# Patient Record
Sex: Female | Born: 1990 | Race: White | Hispanic: No | Marital: Married | Smoking: Never smoker
Health system: Southern US, Community
[De-identification: ages and names within clinical notes are randomized; demographics above are authoritative.]

## PROBLEM LIST (undated history)

## (undated) ENCOUNTER — Inpatient Hospital Stay: Payer: Self-pay

## (undated) DIAGNOSIS — F53 Postpartum depression: Secondary | ICD-10-CM

## (undated) DIAGNOSIS — E282 Polycystic ovarian syndrome: Secondary | ICD-10-CM

## (undated) DIAGNOSIS — F419 Anxiety disorder, unspecified: Secondary | ICD-10-CM

## (undated) DIAGNOSIS — N200 Calculus of kidney: Secondary | ICD-10-CM

## (undated) DIAGNOSIS — O99345 Other mental disorders complicating the puerperium: Secondary | ICD-10-CM

## (undated) DIAGNOSIS — N912 Amenorrhea, unspecified: Secondary | ICD-10-CM

## (undated) HISTORY — DX: Anxiety disorder, unspecified: F41.9

## (undated) HISTORY — DX: Amenorrhea, unspecified: N91.2

## (undated) HISTORY — PX: BREAST FIBROADENOMA SURGERY: SHX580

## (undated) HISTORY — DX: Polycystic ovarian syndrome: E28.2

## (undated) HISTORY — DX: Other mental disorders complicating the puerperium: O99.345

## (undated) HISTORY — DX: Postpartum depression: F53.0

---
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
## 2008-09-21 HISTORY — PX: BREAST EXCISIONAL BIOPSY: SUR124

## 2009-04-18 ENCOUNTER — Other Ambulatory Visit: Admission: RE | Admit: 2009-04-18 | Discharge: 2009-04-18 | Payer: Self-pay | Admitting: Gynecology

## 2009-04-18 ENCOUNTER — Ambulatory Visit: Payer: Self-pay | Admitting: Women's Health

## 2009-04-18 ENCOUNTER — Encounter: Payer: Self-pay | Admitting: Women's Health

## 2009-04-22 ENCOUNTER — Encounter: Admission: RE | Admit: 2009-04-22 | Discharge: 2009-04-22 | Payer: Self-pay | Admitting: Gynecology

## 2009-07-08 ENCOUNTER — Encounter (INDEPENDENT_AMBULATORY_CARE_PROVIDER_SITE_OTHER): Payer: Self-pay | Admitting: General Surgery

## 2009-07-08 ENCOUNTER — Ambulatory Visit (HOSPITAL_BASED_OUTPATIENT_CLINIC_OR_DEPARTMENT_OTHER): Admission: RE | Admit: 2009-07-08 | Discharge: 2009-07-08 | Payer: Self-pay | Admitting: General Surgery

## 2011-02-06 NOTE — Op Note (Signed)
NAMEFLOREEN, TEEGARDEN               ACCOUNT NO.:  [id number]   MEDICAL RECORD NO.:  [id number]          PATIENT TYPE:  AMB   LOCATION:  DSC                          FACILITY:  [facility]   PHYSICIAN:  Thornton Park. Daphine Deutscher, MD  DATE OF BIRTH:  1991-02-18   DATE OF PROCEDURE:  01/21/2007  DATE OF DISCHARGE:                               OPERATIVE REPORT   PREOPERATIVE DIAGNOSIS:  Fibroadenomata right breast.   POSTOPERATIVE DIAGNOSIS:  Fibroadenomata right breast.   PROCEDURE:  Right breast biopsy.   SURGEON:  Thornton Park. Daphine Deutscher, M.D.   ANESTHESIA:  General by LMA.   PREOPERATIVE INDICATIONS:  Rumaysa Sabatino is an 20 year old white female  who developed these palpable masses in her right upper outer breast.  They appeared to be fibroadenomas by ultrasound, but she was very  worried about them and wanted them removed.   She was taken to room 8 at [facility] on Jan 21, 2007.  Before  being given any sedation, she and I palpated the area and marked it.  I  tried to make an upper outer quadrant incision and to go a little  farther out and  then come in to get these masses, which were very  mobile.  I made the incision after prepping with Techni-Care and  draping.  I then used a suture grab the lesion and then cut down on and  get around it.  I cut out a nest of fibroadenomata which were palpable.  I palpated the remaining portion of breast and felt some just barely  fibrotic very hormonally active breast tissue, but no other lesions were  palpable at this time.  The area was injected with Marcaine and then  closed in layers of 4-0 Vicryl, 5-0 Vicryl subcuticularly, and with  Benzoin and Steri-Strips.  The patient seemed to tolerate the procedure  well.  She was given Vicodin to take for pain and asked to come back in  the office in about 3 weeks.      Thornton Park Daphine Deutscher, MD  Electronically Signed     MBM/MEDQ  D:  01/21/2007  T:  01/21/2007  Job:  [id number]   cc:   Davonna Belling. Young,  N.P.

## 2013-08-14 LAB — POCT WET PREP (WET MOUNT)

## 2015-07-03 ENCOUNTER — Ambulatory Visit: Payer: Self-pay | Admitting: Obstetrics and Gynecology

## 2015-07-03 ENCOUNTER — Encounter: Payer: Self-pay | Admitting: Obstetrics and Gynecology

## 2015-07-03 VITALS — BP 100/68 | HR 88 | Ht 63.0 in | Wt 139.4 lb

## 2015-07-03 DIAGNOSIS — O99345 Other mental disorders complicating the puerperium: Secondary | ICD-10-CM

## 2015-07-03 DIAGNOSIS — N921 Excessive and frequent menstruation with irregular cycle: Secondary | ICD-10-CM

## 2015-07-03 DIAGNOSIS — F53 Postpartum depression: Secondary | ICD-10-CM

## 2015-07-03 DIAGNOSIS — R5383 Other fatigue: Secondary | ICD-10-CM

## 2015-07-03 DIAGNOSIS — Z8742 Personal history of other diseases of the female genital tract: Secondary | ICD-10-CM

## 2015-07-03 DIAGNOSIS — F329 Major depressive disorder, single episode, unspecified: Secondary | ICD-10-CM

## 2015-07-03 DIAGNOSIS — R350 Frequency of micturition: Secondary | ICD-10-CM

## 2015-07-03 LAB — POCT URINALYSIS DIPSTICK
BILIRUBIN UA: NEGATIVE
Glucose, UA: NEGATIVE
Ketones, UA: NEGATIVE
LEUKOCYTES UA: NEGATIVE
NITRITE UA: NEGATIVE
PH UA: 7
PROTEIN UA: NEGATIVE
Spec Grav, UA: 1.01
Urobilinogen, UA: 0.2

## 2015-07-03 LAB — POCT URINE PREGNANCY: Preg Test, Ur: NEGATIVE

## 2015-07-03 MED ORDER — FLUOXETINE HCL 10 MG PO CAPS: 10.0000 mg | ORAL_CAPSULE | Freq: Every day | ORAL | Status: DC

## 2015-07-04 LAB — CBC
HEMATOCRIT: 36.6 % (ref 34.0–46.6)
Hemoglobin: 11.9 g/dL (ref 11.1–15.9)
MCH: 26.1 pg — AB (ref 26.6–33.0)
MCHC: 32.5 g/dL (ref 31.5–35.7)
MCV: 80 fL (ref 79–97)
Platelets: 288 10*3/uL (ref 150–379)
RBC: 4.56 x10E6/uL (ref 3.77–5.28)
RDW: 15.7 % — AB (ref 12.3–15.4)
WBC: 6.5 10*3/uL (ref 3.4–10.8)

## 2015-07-04 LAB — FERRITIN: Ferritin: 10 ng/mL — ABNORMAL LOW (ref 15–150)

## 2015-07-04 LAB — VITAMIN B12: Vitamin B-12: 317 pg/mL (ref 211–946)

## 2015-07-05 LAB — URINE CULTURE: Organism ID, Bacteria: NO GROWTH

## 2015-07-05 LAB — VITAMIN D 25 HYDROXY (VIT D DEFICIENCY, FRACTURES): VIT D 25 HYDROXY: 32.5 ng/mL (ref 30.0–100.0)

## 2015-07-10 ENCOUNTER — Encounter: Payer: Self-pay | Admitting: Obstetrics and Gynecology

## 2015-07-19 ENCOUNTER — Encounter: Payer: Self-pay | Admitting: Emergency Medicine

## 2015-07-19 ENCOUNTER — Emergency Department
Admission: EM | Admit: 2015-07-19 | Discharge: 2015-07-20 | Disposition: A | Discharge status: Other Acute Inpatient Hospital | Attending: Emergency Medicine | Admitting: Emergency Medicine

## 2015-07-19 DIAGNOSIS — Z79899 Other long term (current) drug therapy: Secondary | ICD-10-CM | POA: Insufficient documentation

## 2015-07-19 DIAGNOSIS — Z3202 Encounter for pregnancy test, result negative: Secondary | ICD-10-CM | POA: Diagnosis not present

## 2015-07-19 DIAGNOSIS — F339 Major depressive disorder, recurrent, unspecified: Secondary | ICD-10-CM | POA: Insufficient documentation

## 2015-07-19 DIAGNOSIS — R45851 Suicidal ideations: Secondary | ICD-10-CM

## 2015-07-19 DIAGNOSIS — F32A Depression, unspecified: Secondary | ICD-10-CM

## 2015-07-19 DIAGNOSIS — F329 Major depressive disorder, single episode, unspecified: Secondary | ICD-10-CM | POA: Diagnosis not present

## 2015-07-19 DIAGNOSIS — E282 Polycystic ovarian syndrome: Secondary | ICD-10-CM

## 2015-07-19 LAB — COMPREHENSIVE METABOLIC PANEL
ALK PHOS: 66 U/L (ref 38–126)
ALT: 16 U/L (ref 14–54)
ANION GAP: 10 (ref 5–15)
AST: 23 U/L (ref 15–41)
Albumin: 5.2 g/dL — ABNORMAL HIGH (ref 3.5–5.0)
BUN: 20 mg/dL (ref 6–20)
CHLORIDE: 101 mmol/L (ref 101–111)
CO2: 26 mmol/L (ref 22–32)
Calcium: 9.9 mg/dL (ref 8.9–10.3)
Creatinine, Ser: 0.81 mg/dL (ref 0.44–1.00)
GFR calc Af Amer: >60 mL/min (ref >60)
GLUCOSE: 74 mg/dL (ref 65–99)
POTASSIUM: 3.7 mmol/L (ref 3.5–5.1)
SODIUM: 137 mmol/L (ref 135–145)
TOTAL PROTEIN: 9 g/dL — AB (ref 6.5–8.1)
Total Bilirubin: 0.6 mg/dL (ref 0.3–1.2)

## 2015-07-19 LAB — CBC
HEMATOCRIT: 40.1 % (ref 35.0–47.0)
Hemoglobin: 13.3 g/dL (ref 12.0–16.0)
MCH: 26.3 pg (ref 26.0–34.0)
MCHC: 33 g/dL (ref 32.0–36.0)
MCV: 79.6 fL — ABNORMAL LOW (ref 80.0–100.0)
Platelets: 295 10*3/uL (ref 150–440)
RBC: 5.05 MIL/uL (ref 3.80–5.20)
RDW: 15.6 % — AB (ref 11.5–14.5)
WBC: 8 10*3/uL (ref 3.6–11.0)

## 2015-07-19 LAB — URINE DRUG SCREEN, QUALITATIVE (ARMC ONLY)
AMPHETAMINES, UR SCREEN: NOT DETECTED
BENZODIAZEPINE, UR SCRN: NOT DETECTED
Barbiturates, Ur Screen: NOT DETECTED
Cannabinoid 50 Ng, Ur ~~LOC~~: NOT DETECTED
Cocaine Metabolite,Ur ~~LOC~~: NOT DETECTED
MDMA (Ecstasy)Ur Screen: NOT DETECTED
METHADONE SCREEN, URINE: NOT DETECTED
OPIATE, UR SCREEN: NOT DETECTED
Phencyclidine (PCP) Ur S: NOT DETECTED
Tricyclic, Ur Screen: NOT DETECTED

## 2015-07-19 LAB — ETHANOL: Alcohol, Ethyl (B): <5 mg/dL (ref <5)

## 2015-07-19 LAB — ACETAMINOPHEN LEVEL

## 2015-07-19 LAB — SALICYLATE LEVEL: Salicylate Lvl: <4 mg/dL (ref 2.8–30.0)

## 2015-07-19 LAB — POCT PREGNANCY, URINE: PREG TEST UR: NEGATIVE

## 2015-07-19 MED ORDER — ACETAMINOPHEN 325 MG PO TABS
650.0000 mg | ORAL_TABLET | Freq: Once | ORAL | Status: AC
  Administered 2015-07-19: 650 mg via ORAL

## 2015-07-19 MED ORDER — TRAZODONE HCL 100 MG PO TABS
100.0000 mg | ORAL_TABLET | Freq: Every evening | ORAL | Status: DC | PRN
  Administered 2015-07-19: 100 mg via ORAL
  Filled 2015-07-19: qty 1

## 2015-07-19 MED ORDER — FLUOXETINE HCL 20 MG PO CAPS
20.0000 mg | ORAL_CAPSULE | Freq: Every day | ORAL | Status: DC
  Administered 2015-07-19 – 2015-07-20 (×2): 20 mg via ORAL
  Filled 2015-07-19 (×2): qty 1

## 2015-07-19 MED ORDER — LORAZEPAM 1 MG PO TABS
1.0000 mg | ORAL_TABLET | Freq: Once | ORAL | Status: AC
  Administered 2015-07-19: 1 mg via ORAL

## 2015-07-19 MED ORDER — LORAZEPAM 1 MG PO TABS
ORAL_TABLET | ORAL | Status: AC
  Filled 2015-07-19: qty 1

## 2015-07-19 MED ORDER — ACETAMINOPHEN 325 MG PO TABS
ORAL_TABLET | ORAL | Status: AC
  Filled 2015-07-19: qty 2

## 2015-07-19 NOTE — ED Notes (Signed)
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
## 2015-07-19 NOTE — ED Notes (Signed)
BEHAVIORAL HEALTH ROUNDING Patient sleeping: No. Patient alert and oriented: yes Behavior appropriate: Yes.   Nutrition and fluids offered: Yes  Toileting and hygiene offered: Yes  Sitter present: q15 min observations and security camera monitoring Law enforcement present: Yes [employer].  ENVIRONMENTAL ASSESSMENT Potentially harmful objects out of patient reach: Yes.   Personal belongings secured: Yes.   Patient dressed in hospital provided attire only: Yes.   Plastic bags out of patient reach: Yes.   Patient care equipment removed: Yes.   Equipment and supplies removed: Yes.   Potentially toxic materials out of patient reach: Yes.   Sharps container removed or out of patient reach: Yes.    Pt declined evening snack or beverage.

## 2015-07-19 NOTE — ED Notes (Signed)
BEHAVIORAL HEALTH ROUNDING Patient sleeping: Yes.   Patient alert and oriented: not applicable Behavior appropriate: Yes.    Nutrition and fluids offered: No Toileting and hygiene offered: No Sitter present: q15 minute observations and security camera monitoring Law enforcement present: Yes [employer] 
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
## 2015-07-19 NOTE — ED Notes (Signed)

## 2015-07-19 NOTE — ED Notes (Signed)
md Clpapacss consulted  - plan is for pt to be admitted to [facility] when staff/bed become available  Pt aware  Spouse allowed to visit for 15 minutes  - questions answered

## 2015-07-19 NOTE — ED Notes (Signed)
Introduced self to pt.  Pt asked what the process is with her current admission. I verified who the pt had talked with and explained the process. And informed her that once TTS gets her a bed assigned in [facility] Behavioral unit that I will call report when possible and get her moved downstairs. I explained the possibility of what could happen once the pt is admitted to lower level. Pt requested something for anxiety, stated that I will contact EDP, but did caution that EDP may defer anxiety medication until the pt is admitted downstairs.,

## 2015-07-19 NOTE — ED Notes (Signed)
Report received from [place].  Pt in room. No complaints or concerns voiced at this time. No abnormal behavior noted at this time. Will continue to monitor with q15 min checks and security camera monitoring. ODS officer in area.

## 2015-07-19 NOTE — ED Notes (Signed)
Patient called husband.  Patient feels safe in the hospital. She is aware that she will be transferred to the inpatient unit when she is assigned a bed.  Maintained on 15 minute checks and observation by security camera for safety.

## 2015-07-19 NOTE — ED Notes (Signed)
Pt requested something for anxiety. Spoke with Dr Clearnce Hasten EDP--1mg  ativan PO once ordered and administered as per MD order.

## 2015-07-19 NOTE — ED Notes (Signed)
Pt in room. No complaints or concerns voiced at this time. No abnormal behavior noted at this time. Will continue to monitor with q15 min checks and security camera monitoring. ODS officer in area.

## 2015-07-19 NOTE — ED Notes (Signed)
Pt states she was started on Prozac two weeks ago for post partum depression. Pt states that she has been having suicidal thoughts for last week. Pt states she is having marital problems that has contributed to the thoughts. Pt states she would shoot herself but does not have easy access to a gun. Pt is voluntarily checking in and is brought in by her husband. Pt is tearful at triage.

## 2015-07-19 NOTE — ED Notes (Signed)
BEHAVIORAL HEALTH ROUNDING Patient sleeping: No. Patient alert and oriented: yes Behavior appropriate: Yes.  ; If no, describe:  Nutrition and fluids offered: yes Toileting and hygiene offered: Yes  Sitter present: q15 minute observations and security camera monitoring Law enforcement present: Yes  ODS  

## 2015-07-19 NOTE — ED Provider Notes (Signed)
[facility] Emergency Department Provider Note  ____________________________________________  Time seen: Approximately 6:10 PM  I have reviewed the triage vital signs and the nursing notes.   HISTORY  Chief Complaint Suicidal    HPI Stephanie Logan is a 24 y.o. female with a history of depression since giving birth a month ago who is now having suicidal thoughts. She says that she is thinking of shooting herself with a gun if she had one. Says has been increasingly depressed despite being on Prozac. Denies any pain at this time.Denies any ingestions including any drugs or alcohol.   Past Medical History  Diagnosis Date  . PCOS (polycystic ovarian syndrome)   . Post partum depression     Patient Active Problem List   Diagnosis Date Noted  . Major depressive episode 07/19/2015  . PCOS (polycystic ovarian syndrome) 07/19/2015  . Suicidal ideation 07/19/2015    Past Surgical History  Procedure Laterality Date  . Breast fibroadenoma surgery  2008,2010    Current Outpatient Rx  Name  Route  Sig  Dispense  Refill  . clonazePAM (KLONOPIN) 0.5 MG tablet   Oral   Take 0.5 mg by mouth 2 (two) times daily as needed for anxiety.         Marland Kitchen FLUoxetine (PROZAC) 10 MG capsule   Oral   Take 1 capsule (10 mg total) by mouth daily.   30 capsule   3   . ibuprofen (ADVIL,MOTRIN) 800 MG tablet   Oral   Take 800 mg by mouth every 8 (eight) hours as needed for fever, headache, mild pain, moderate pain or cramping.           Allergies Review of patient's allergies indicates no known allergies.  Family History  Problem Relation Age of Onset  . Cancer Maternal Grandmother     brain    Social History Social History  Substance Use Topics  . Smoking status: Never Smoker   . Smokeless tobacco: Never Used  . Alcohol Use: Yes     Comment: occas    Review of Systems Constitutional: No fever/chills Eyes: No visual changes. ENT: No sore  throat. Cardiovascular: Denies chest pain. Respiratory: Denies shortness of breath. Gastrointestinal: No abdominal pain.  No nausea, no vomiting.  No diarrhea.  No constipation. Genitourinary: Negative for dysuria. Musculoskeletal: Negative for back pain. Skin: Negative for rash. Neurological: Negative for headaches, focal weakness or numbness.  10-point ROS otherwise negative.  ____________________________________________   PHYSICAL EXAM:  VITAL SIGNS: ED Triage Vitals  Enc Vitals Group     BP 07/19/15 1526 105/73 mmHg     Pulse Rate 07/19/15 1526 75     Resp 07/19/15 1526 15     Temp 07/19/15 1526 98.4 F (36.9 C)     Temp Source 07/19/15 1526 Oral     SpO2 07/19/15 1526 99 %     Weight 07/19/15 1526 135 lb (61.236 kg)     Height 07/19/15 1526 5\' 2"  (1.575 m)     Head Cir --      Peak Flow --      Pain Score 07/19/15 1535 6     Pain Loc --      Pain Edu? --      Excl. in [place]? --     Constitutional: Alert and oriented. Well appearing and in no acute distress. Eyes: Conjunctivae are normal. PERRL. EOMI. Head: Atraumatic. Nose: No congestion/rhinnorhea. Mouth/Throat: Mucous membranes are moist.  Oropharynx non-erythematous. Neck: No stridor.  Cardiovascular: Normal rate, regular rhythm. Grossly normal heart sounds.  Good peripheral circulation. Respiratory: Normal respiratory effort.  No retractions. Lungs CTAB. Gastrointestinal: Soft and nontender. No distention. No abdominal bruits. No CVA tenderness. Musculoskeletal: No lower extremity tenderness nor edema.  No joint effusions. Neurologic:  Normal speech and language. No gross focal neurologic deficits are appreciated. No gait instability. Skin:  Skin is warm, dry and intact. No rash noted. Psychiatric: Breast mood. Speech and behavior are normal.  ____________________________________________   LABS (all labs ordered are listed, but only abnormal results are displayed)  Labs Reviewed  COMPREHENSIVE METABOLIC  PANEL - Abnormal; Notable for the following:    Total Protein 9.0 (*)    Albumin 5.2 (*)    All other components within normal limits  ACETAMINOPHEN LEVEL - Abnormal; Notable for the following:    Acetaminophen (Tylenol), Serum <10 (*)    All other components within normal limits  CBC - Abnormal; Notable for the following:    MCV 79.6 (*)    RDW 15.6 (*)    All other components within normal limits  ETHANOL  SALICYLATE LEVEL  URINE DRUG SCREEN, QUALITATIVE ([facility] ONLY)  POCT PREGNANCY, URINE  POC URINE PREG, ED   ____________________________________________  EKG   ____________________________________________  RADIOLOGY   ____________________________________________   PROCEDURES   ____________________________________________   INITIAL IMPRESSION / ASSESSMENT AND PLAN / ED COURSE  Pertinent labs & imaging results that were available during my care of the patient were reviewed by me and considered in my medical decision making (see chart for details).  Patient has been seen and evaluated by Dr. Weber Cooks and he is recommending admission to the psychiatric unit at this time. The patient understands the plan is one to comply. ____________________________________________   FINAL CLINICAL IMPRESSION(S) / ED DIAGNOSES  Depression with suicidal ideation.    Orbie Pyo, MD 07/19/15 [phone]

## 2015-07-20 ENCOUNTER — Encounter: Payer: Self-pay | Admitting: General Practice

## 2015-07-20 ENCOUNTER — Inpatient Hospital Stay
Admission: AD | Admit: 2015-07-20 | Discharge: 2015-07-22 | DRG: 885 | Disposition: A | Discharge status: Home / Self Care | Source: Ambulatory Visit | Attending: Psychiatry | Admitting: Psychiatry

## 2015-07-20 DIAGNOSIS — Z818 Family history of other mental and behavioral disorders: Secondary | ICD-10-CM

## 2015-07-20 DIAGNOSIS — E282 Polycystic ovarian syndrome: Secondary | ICD-10-CM | POA: Diagnosis present

## 2015-07-20 DIAGNOSIS — R45851 Suicidal ideations: Secondary | ICD-10-CM | POA: Diagnosis present

## 2015-07-20 DIAGNOSIS — F339 Major depressive disorder, recurrent, unspecified: Secondary | ICD-10-CM | POA: Diagnosis not present

## 2015-07-20 DIAGNOSIS — F332 Major depressive disorder, recurrent severe without psychotic features: Secondary | ICD-10-CM | POA: Diagnosis present

## 2015-07-20 DIAGNOSIS — G4700 Insomnia, unspecified: Secondary | ICD-10-CM | POA: Diagnosis present

## 2015-07-20 DIAGNOSIS — Z809 Family history of malignant neoplasm, unspecified: Secondary | ICD-10-CM | POA: Diagnosis not present

## 2015-07-20 DIAGNOSIS — F329 Major depressive disorder, single episode, unspecified: Secondary | ICD-10-CM | POA: Diagnosis present

## 2015-07-20 DIAGNOSIS — Z630 Problems in relationship with spouse or partner: Secondary | ICD-10-CM | POA: Diagnosis not present

## 2015-07-20 DIAGNOSIS — F419 Anxiety disorder, unspecified: Secondary | ICD-10-CM | POA: Diagnosis present

## 2015-07-20 LAB — LIPID PANEL
CHOL/HDL RATIO: 2.9 ratio
CHOLESTEROL: 197 mg/dL (ref 0–200)
HDL: 67 mg/dL (ref >40)
LDL Cholesterol: 119 mg/dL — ABNORMAL HIGH (ref 0–99)
TRIGLYCERIDES: 54 mg/dL (ref <150)
VLDL: 11 mg/dL (ref 0–40)

## 2015-07-20 LAB — TSH: TSH: 0.792 u[IU]/mL (ref 0.350–4.500)

## 2015-07-20 LAB — HEMOGLOBIN A1C: HEMOGLOBIN A1C: 5.3 % (ref 4.0–6.0)

## 2015-07-20 MED ORDER — FLUOXETINE HCL 20 MG PO CAPS
20.0000 mg | ORAL_CAPSULE | Freq: Every day | ORAL | Status: DC
  Administered 2015-07-21 – 2015-07-22 (×2): 20 mg via ORAL
  Filled 2015-07-20 (×2): qty 1

## 2015-07-20 MED ORDER — ACETAMINOPHEN 325 MG PO TABS
650.0000 mg | ORAL_TABLET | Freq: Four times a day (QID) | ORAL | Status: DC | PRN
  Administered 2015-07-20: 650 mg via ORAL
  Filled 2015-07-20: qty 2

## 2015-07-20 MED ORDER — ALUM & MAG HYDROXIDE-SIMETH 200-200-20 MG/5ML PO SUSP: 30.0000 mL | ORAL | Status: DC | PRN

## 2015-07-20 MED ORDER — MAGNESIUM HYDROXIDE 400 MG/5ML PO SUSP: 30.0000 mL | Freq: Every day | ORAL | Status: DC | PRN

## 2015-07-20 MED ORDER — CLONAZEPAM 0.5 MG PO TABS
0.5000 mg | ORAL_TABLET | Freq: Two times a day (BID) | ORAL | Status: DC | PRN
  Administered 2015-07-20: 0.5 mg via ORAL
  Filled 2015-07-20: qty 1

## 2015-07-20 MED ORDER — TRAZODONE HCL 100 MG PO TABS: 100.0000 mg | ORAL_TABLET | Freq: Every evening | ORAL | Status: DC | PRN

## 2015-07-20 MED ORDER — TRAZODONE HCL 50 MG PO TABS
50.0000 mg | ORAL_TABLET | Freq: Every day | ORAL | Status: DC
  Administered 2015-07-20 – 2015-07-21 (×2): 50 mg via ORAL
  Filled 2015-07-20 (×2): qty 1
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
## 2015-07-20 NOTE — ED Notes (Signed)
NAD noted at this time. Pt calm and cooperative with staff. Will continue to monitor with 15 min safety rounds at this time.

## 2015-07-20 NOTE — ED Notes (Signed)
ED BHU [place] Is the patient under IVC or is there intent for IVC: Yes.   Is the patient medically cleared: Yes.   Is there vacancy in the ED BHU: Yes.   Is the population mix appropriate for patient: Yes.   Is the patient awaiting placement in inpatient or outpatient setting: Yes.   Has the patient had a psychiatric consult: Yes.   Survey of unit performed for contraband, proper placement and condition of furniture, tampering with fixtures in bathroom, shower, and each patient room: Yes.  ; Findings: Unremarkable APPEARANCE/BEHAVIOR calm, cooperative and adequate rapport can be established NEURO ASSESSMENT Orientation: time, place and person Hallucinations: No.None noted (Hallucinations) Speech: Normal Gait: normal RESPIRATORY ASSESSMENT Normal expansion.  Clear to auscultation.  No rales, rhonchi, or wheezing. CARDIOVASCULAR ASSESSMENT regular rate and rhythm, S1, S2 normal, no murmur, click, rub or gallop GASTROINTESTINAL ASSESSMENT soft, nontender, BS WNL, no r/g EXTREMITIES normal strength, tone, and muscle mass PLAN OF CARE Provide calm/safe environment. Vital signs assessed twice daily. ED BHU Assessment once each 12-hour shift. Collaborate with intake RN daily or as condition indicates. Assure the ED provider has rounded once each shift. Provide and encourage hygiene. Provide redirection as needed. Assess for escalating behavior; address immediately and inform ED provider.  Assess family dynamic and appropriateness for visitation as needed: Yes.  ; If necessary, describe findings:  Educate the patient/family about BHU procedures/visitation: Yes.  ; If necessary, describe findings:

## 2015-07-20 NOTE — ED Notes (Signed)
Pt in room. No complaints or concerns voiced at this time. No abnormal behavior noted at this time. Will continue to monitor with q15 min checks and security camera monitoring. ODS officer in area.

## 2015-07-20 NOTE — ED Notes (Signed)
Pt visualized in NAD. Pt ambulatory around her room. Respirations noted to be even and unlabored at this time. Pt alert and oriented.

## 2015-07-20 NOTE — Progress Notes (Signed)
Pt. is to be admitted to [facility] by Dr. Weber Cooks. Attending Physician will be Dr. Jimmye Norman.  Pt. has been assigned to room 322A, by [facility].  Intake Paper Work has been signed and placed on pt. chart. ER staff [facility].; Dr. Karma Greaser, ER MD; Jinny Blossom Patient's Nurse & Marliss Czar Patient Access) have been made aware of the admission.         07/20/2015 Con Memos, MS, [place], [facility]CA Therapeutic Triage Specialist

## 2015-07-20 NOTE — ED Notes (Signed)
BEHAVIORAL HEALTH ROUNDING Patient sleeping: No. Patient alert and oriented: yes Behavior appropriate: Yes.  ; If no, describe:  Nutrition and fluids offered: Yes Toileting and hygiene offered: Yes  Sitter present: yes Law enforcement present: Yes [facility]  

## 2015-07-20 NOTE — BHH Group Notes (Signed)
[place] LCSW Group Therapy  07/20/2015 2:35 PM  Type of Therapy:  Group Therapy  Participation Level:  Minimal   Participation Quality:  Attentive  Affect:  Tearful   Cognitive:  Alert  Insight:  Limited  Engagement in Therapy:  Limited  Modes of Intervention:  Discussion, Education, Socialization and Support  Summary of Progress/Problems: Pt will identify unhealthy thoughts and how they impact their emotions and behavior. Pt will be encouraged to discuss these thoughts, emotions and behaviors with the group. Pt attended group and stayed the entire time. She sat quietly and listened to other group members share.   [place] MSW, [place]  07/20/2015, 2:35 PM

## 2015-07-20 NOTE — ED Provider Notes (Addendum)
-----------------------------------------   7:04 AM on 07/20/2015 -----------------------------------------   Blood pressure 110/65, pulse 94, temperature 97.9 F (36.6 C), temperature source Oral, resp. rate 20, height 5\' 2"  (1.575 m), weight 135 lb (61.236 kg), last menstrual period 06/25/2015, SpO2 100 %.  The patient had no acute events since last update.  Calm and cooperative at this time.  Disposition is pending per Psychiatry/Behavioral Medicine team recommendations, and yesterday Dr. Weber Cooks recommended admission.    ----------------------------------------- 10:42 AM on 07/20/2015 -----------------------------------------  I was just informed that they are preparing to take the patient downstairs for major depressive disorder.  Hinda Kehr, MD 07/20/15 [phone]

## 2015-07-20 NOTE — ED Notes (Signed)
NAD noted at this time. Pt to be taken down to [facility] Unit by tech and officer. Pt ambulatory at this time.

## 2015-07-20 NOTE — ED Notes (Signed)
BEHAVIORAL HEALTH ROUNDING Patient sleeping: Yes.   Patient alert and oriented: not applicable Behavior appropriate: Yes.    Nutrition and fluids offered: No Toileting and hygiene offered: No Sitter present: q15 minute observations and security camera monitoring Law enforcement present: Yes [employer] 

## 2015-07-20 NOTE — ED Notes (Signed)
BEHAVIORAL HEALTH ROUNDING Patient sleeping: No. Patient alert and oriented: yes Behavior appropriate: Yes.   Nutrition and fluids offered: Yes  Toileting and hygiene offered: Yes  Sitter present: q15 min observations and security camera monitoring Law enforcement present: Yes [employer] 

## 2015-07-20 NOTE — ED Notes (Signed)

## 2015-07-20 NOTE — BHH Suicide Risk Assessment (Signed)
[facility] Admission Suicide Risk Assessment   Nursing information obtained from:    Demographic factors:    Caucasian, age Current Mental Status:    depression, suicidal ideation with plan Loss Factors:    stable housing Historical Factors:    no past suicide attempt Risk Reduction Factors:    female, social supports, minor children living in the home Total Time spent with patient: 1 hour Principal Problem: <principal problem not specified> Diagnosis:   Patient Active Problem List   Diagnosis Date Noted  . Major depression ([place]) [F32.9] 07/20/2015  . Major depressive episode [F32.9] 07/19/2015  . PCOS (polycystic ovarian syndrome) [E28.2] 07/19/2015  . Suicidal ideation [R45.851] 07/19/2015     Continued Clinical Symptoms:    The "Alcohol Use Disorders Identification Test", Guidelines for Use in Primary Care, Second Edition.  World Pharmacologist [facility]). Score between 0-7:  no or low risk or alcohol related problems. Score between 8-15:  moderate risk of alcohol related problems. Score between 16-19:  high risk of alcohol related problems. Score 20 or above:  warrants further diagnostic evaluation for alcohol dependence and treatment.   CLINICAL FACTORS:   Depression:   Severe   Musculoskeletal: Strength & Muscle Tone: within normal limits Gait & Station: normal Patient leans: N/A  Psychiatric Specialty Exam: Physical Exam  ROS  Blood pressure 97/63, pulse 72, temperature 98.6 F (37 C), temperature source Oral, resp. rate 18, height 5\' 2"  (1.575 m), weight 59.648 kg (131 lb 8 oz), last menstrual period 06/25/2015, SpO2 99 %.Body mass index is 24.05 kg/(m^2).  See H and P                                                       COGNITIVE FEATURES THAT CONTRIBUTE TO RISK:  None    SUICIDE RISK:   Moderate:  Frequent suicidal ideation with limited intensity, and duration, some specificity in terms of plans, no associated intent, good self-control,  limited dysphoria/symptomatology, some risk factors present, and identifiable protective factors, including available and accessible social support. Patient seems to describe the thoughts as ego dystonic stating that there is a side of her that she has a good life, children and thus no reason to commit suicide. She has no past suicide attempts. She seemed to have some forward thinking and desires wanting to engage in therapy both individual and marital.  PLAN OF CARE:  Daily contact with patient to assess and evaluate symptoms and progress in treatment, Medication management and Plan Major depressive disorder, severe without psychotic features-patient just had her Prozac increased from 10 mg to 20 mg in the emergency room here.   Insomnia-we will reduce patient's dose of trazodone from 100 mg to 50 mg as she is reporting excessive sedation in the daytime.   Anxiety-patient reports past response to Klonopin and thus we will prescribe Klonopin 0.5 mg twice daily as needed for anxiety.   Suicidal ideation with intent-continuous monitoring. Patient will participate in therapeutic milieu to explore ways of coping with her recent stressors.   Disposition-patient will likely be undergo home, describes has been supportive and has made plans to engage in treatment and already has some appointment schedule. Medical Decision Making:  Review of Psycho-Social Stressors (1), Established Problem, Worsening (2), Review of Medication Regimen & Side Effects (2) and Review of New Medication or  Change in Dosage (2)  I certify that inpatient services furnished can reasonably be expected to improve the patient's condition.   Faith Rogue 07/20/2015, 3:50 PM

## 2015-07-20 NOTE — ED Notes (Signed)
Patient assigned to appropriate care area. Patient oriented to unit/care area: Informed that, for their safety, care areas are designed for safety and monitored by security cameras at all times; and visiting hours explained to patient. Patient verbalizes understanding, and verbal contract for safety obtained. 

## 2015-07-20 NOTE — H&P (Signed)
Psychiatric Admission Assessment Adult  Patient Identification: Stephanie Logan MRN:  [id number] Date of Evaluation:  07/20/2015 Chief Complaint:  major depressive disorder "thoughts of suicide." Principal Diagnosis: <principal problem not specified> Diagnosis:   Patient Active Problem List   Diagnosis Date Noted  . Major depression ([place]) [F32.9] 07/20/2015  . Major depressive episode [F32.9] 07/19/2015  . PCOS (polycystic ovarian syndrome) [E28.2] 07/19/2015  . Suicidal ideation [R45.851] 07/19/2015   History of Present Illness:: Patient indicates that since her 52-month-old was born she had been experiencing depression. She reports depressive symptoms of difficulty sleeping, anhedonia although she states she did have fun on a vacation 2 weeks ago. She endorses feelings of guilt and worthlessness. She reports low energy, poor appetite, depressed mood and suicidal ideation. She indicated that things have worsened over the past week. She indicates that this week she also informed her husband of a excellent discretion that occurred 6 years ago. She has significant guilt around this issue. She states that he accepted the news graciously and this further makes her feel unworthy of him.  She states that she saw her midwife 3 weeks ago and was placed on Prozac although it appears it was a low dose of 10 mg. In the emergency room Dr. Carlena Hurl increased this dose to 20 mg. He states that she also received treatment with Prozac after the birth of her first son who is now 67-1/2 years old. She states she took the Prozac for about 2 months and it was helpful then in terms of depressed mood.  She denies any symptoms consistent with mania and denies any symptoms consistent with auditory or visual hallucinations. She does state that in regards to her suicidal ideation she feels like she is having 2 different thoughts about it. She states that one side of her speaks to the fact she has a good life, good husband  and 2 kids and thus no reason for suicide. She states the other side speaks to her being depressed, worthless and suicidal. She states they're not frank voices but she describes him as very strong conflicting thoughts.  She states that she began some individual therapy at [facility] in [place] this past week and has an upcoming couple's counseling session scheduled for 07/23/2015 as well as another individual therapy session scheduled for later next week.  Associated Signs/Symptoms: Depression Symptoms:  depressed mood, anhedonia, insomnia, feelings of worthlessness/guilt, suicidal thoughts with specific plan, anxiety, loss of energy/fatigue, decreased appetite, (Hypo) Manic Symptoms:  Denies Anxiety Symptoms:  Excessive Worry, she describes symptoms of palpitations, racing thoughts and feeling like she is not existing in reality. She states that typically this can last a full day but does not happen every day Psychotic Symptoms:  None PTSD Symptoms: Negative Total Time spent with patient: 1 hour  Past Psychiatric History: She denies any past psychiatric hospitalizations. She denies any previous suicide attempts. As discussed above she has been started on Prozac 3 weeks ago. She also had a previous trial of Prozac for about 2 months 2 and half years ago. She has recently started therapy as discussed above. She states she's never had medication management from a psychiatrist.  Risk to Self:   currently Risk to Others:   none Prior Inpatient Therapy:   none Prior Outpatient Therapy:   just recently started  Alcohol Screening: 1. How often do you have a drink containing alcohol?: 2 to 3 times a week 2. How many drinks containing alcohol do you have on a typical  day when you are drinking?: 3 or 4 3. How often do you have six or more drinks on one occasion?: Never Preliminary Score: 1 Brief Intervention: Yes Substance Abuse History in the last 12 months:  No. Consequences of  Substance Abuse: NA Previous Psychotropic Medications: Yes  Psychological Evaluations: No  Past Medical History:  Past Medical History  Diagnosis Date  . PCOS (polycystic ovarian syndrome)   . Post partum depression     Past Surgical History  Procedure Laterality Date  . Breast fibroadenoma surgery  2008,2010   Family History:  Family History  Problem Relation Age of Onset  . Cancer Maternal Grandmother     brain   Family Psychiatric  History: Patient states that her twins Sr. is treated for depression and her twin sister had suicidal ideation in 2008 or 2009 Social History:  History  Alcohol Use  . Yes    Comment: occas     History  Drug Use No    Social History   Social History  . Marital Status: Married    Spouse Name: N/A  . Number of Children: N/A  . Years of Education: N/A   Social History Main Topics  . Smoking status: Never Smoker   . Smokeless tobacco: Never Used  . Alcohol Use: Yes     Comment: occas  . Drug Use: No  . Sexual Activity: Yes    Birth Control/ Protection: None   Other Topics Concern  . None   Social History Narrative   Additional Social History:    patient is married. She has a 78 month old son and a 13-1/2-year-old son. She states her husband was in the [employer] and lost both of his legs. Patient has stopped working to take care of him. She has had some social stressors and that they had a house built for them through a veterans housing program however it ended up having so many structural defects that they were not able to reside there. She states there were scheduled to move in there in July but with all the problems they were not able to and thus they're in some temporary housing which is not desirable to the patient.  Asian describes her childhood as fairly good. She states her parents went through a difficult divorce and she had some court mandated treatment for/therapy during their divorce. She denies any abuse growing up. She has  the twins Sr. discussed above and one brother. History of alcohol / drug use?: No history of alcohol / drug abuse                    Allergies:  No Known Allergies Lab Results:  Results for orders placed or performed during the hospital encounter of 07/20/15 (from the past 48 hour(s))  Hemoglobin A1c     Status: None   Collection Time: 07/19/15  3:42 PM  Result Value Ref Range   Hgb A1c MFr Bld 5.3 4.0 - 6.0 %  Lipid panel, fasting     Status: Abnormal   Collection Time: 07/19/15  3:42 PM  Result Value Ref Range   Cholesterol 197 0 - 200 mg/dL   Triglycerides 54 <150 mg/dL   HDL 67 >40 mg/dL   Total CHOL/HDL Ratio 2.9 RATIO   VLDL 11 0 - 40 mg/dL   LDL Cholesterol 119 (H) 0 - 99 mg/dL    Comment:        Total Cholesterol/HDL:CHD Risk Coronary Heart Disease Risk Table  Men   Women  1/2 Average Risk   3.4   3.3  Average Risk       5.0   4.4  2 X Average Risk   9.6   7.1  3 X Average Risk  23.4   11.0        Use the calculated Patient Ratio above and the CHD Risk Table to determine the patient's CHD Risk.        ATP III CLASSIFICATION (LDL):  <100     mg/dL   Optimal  100-129  mg/dL   Near or Above                    Optimal  130-159  mg/dL   Borderline  160-189  mg/dL   High  >190     mg/dL   Very High   TSH     Status: None   Collection Time: 07/19/15  3:42 PM  Result Value Ref Range   TSH 0.792 0.350 - 4.500 uIU/mL    Metabolic Disorder Labs:  Lab Results  Component Value Date   HGBA1C 5.3 07/19/2015   No results found for: PROLACTIN Lab Results  Component Value Date   CHOL 197 07/19/2015   TRIG 54 07/19/2015   HDL 67 07/19/2015   CHOLHDL 2.9 07/19/2015   VLDL 11 07/19/2015   LDLCALC 119* 07/19/2015    Current Medications: Current Facility-Administered Medications  Medication Dose Route Frequency Provider Last Rate Last Dose  . acetaminophen (TYLENOL) tablet 650 mg  650 mg Oral Q6H PRN Gonzella Lex, MD   650 mg at 07/20/15  1318  . alum & mag hydroxide-simeth (MAALOX/MYLANTA) 200-200-20 MG/5ML suspension 30 mL  30 mL Oral Q4H PRN Gonzella Lex, MD      . Derrill Memo ON 07/21/2015] FLUoxetine (PROZAC) capsule 20 mg  20 mg Oral Daily John T Clapacs, MD      . magnesium hydroxide (MILK OF MAGNESIA) suspension 30 mL  30 mL Oral Daily PRN Gonzella Lex, MD      . traZODone (DESYREL) tablet 50 mg  50 mg Oral QHS Marjie Skiff, MD       PTA Medications: Prescriptions prior to admission  Medication Sig Dispense Refill Last Dose  . ibuprofen (ADVIL,MOTRIN) 800 MG tablet Take 800 mg by mouth every 8 (eight) hours as needed for fever, headache, mild pain, moderate pain or cramping.   PRN at PRN    Musculoskeletal: Strength & Muscle Tone: within normal limits Gait & Station: normal Patient leans: N/A  Psychiatric Specialty Exam: Physical Exam  Review of Systems  Neurological: Positive for headaches (Patient she stated she had a headache while waiting in the emergency room but it resolved with Tylenol).  Psychiatric/Behavioral: Positive for depression and suicidal ideas. Negative for hallucinations, memory loss and substance abuse. The patient is nervous/anxious and has insomnia.   All other systems reviewed and are negative.   Blood pressure 97/63, pulse 72, temperature 98.6 F (37 C), temperature source Oral, resp. rate 18, height 5\' 2"  (1.575 m), weight 59.648 kg (131 lb 8 oz), last menstrual period 06/25/2015, SpO2 99 %.Body mass index is 24.05 kg/(m^2).  General Appearance: Well Groomed  Engineer, water::  Good  Speech:  Normal Rate  Volume:  Normal  Mood:  Depressed  Affect:  Congruent  Thought Process:  Linear and Logical  Orientation:  Full (Time, Place, and Person)  Thought Content:  Negative  Suicidal Thoughts:  Yes.  with  intent/plan  Homicidal Thoughts:  No  Memory:  Immediate;   Good Recent;   Good Remote;   Good  Judgement:  Impaired  Insight:  Fair  Psychomotor Activity:  Negative   Concentration:  Fair  Recall:  Good  Fund of Knowledge:Good  Language: Good  Akathisia:  Negative  Handed:    AIMS (if indicated):     Assets:  Communication Skills Desire for Improvement Social Support  ADL's:  Intact  Cognition: WNL  Sleep:    poor but relates he responded to trazodone in the ED      Treatment Plan Summary: Daily contact with patient to assess and evaluate symptoms and progress in treatment, Medication management and Plan Major depressive disorder, severe without psychotic features-patient just had her Prozac increased from 10 mg to 20 mg in the emergency room here.   Insomnia-we will reduce patient's dose of trazodone from 100 mg to 50 mg as she is reporting excessive sedation in the daytime.   Anxiety-patient reports past response to Klonopin and thus we will prescribe Klonopin 0.5 mg twice daily as needed for anxiety.   Suicidal ideation with intent-continuous monitoring. Patient will participate in therapeutic milieu to explore ways of coping with her recent stressors.   Disposition-patient will likely be undergo home, describes has been supportive and has made plans to engage in treatment and already has some appointment schedule.  Observation Level/Precautions:  Continuous Observation  Laboratory:  Reviewed  Psychotherapy:    Medications:    Consultations:    Discharge Concerns:    Estimated LOS:  Other:     I certify that inpatient services furnished can reasonably be expected to improve the patient's condition.   Faith Rogue 10/29/20163:36 PM

## 2015-07-20 NOTE — ED Notes (Signed)
NAD noted at this time. Pt states that she has intermittent thoughts of hurting herself with "no plan exactly" but denies SI/HI at this time. Pt presents as being calm and cooperative but states that she feels "trapped". Pt noted to be intermittently tearful when speaking about her children and current situation. Pt denies any needs at this time.

## 2015-07-20 NOTE — ED Notes (Signed)
NAD noted at this time. Pt calm and cooperative with staff. Respirations noted to be even and unlabored.

## 2015-07-20 NOTE — Tx Team (Signed)
Initial Interdisciplinary Treatment Plan   PATIENT STRESSORS: Marital or family conflict Traumatic event   PATIENT STRENGTHS: Communication skills General fund of knowledge Supportive family/friends   PROBLEM LIST: Problem List/Patient Goals Date to be addressed Date deferred Reason deferred Estimated date of resolution  depression 07/20/2015     suicide 07/20/2015                                                DISCHARGE CRITERIA:  Ability to meet basic life and health needs Safe-care adequate arrangements made Verbal commitment to aftercare and medication compliance  PRELIMINARY DISCHARGE PLAN: Attend aftercare/continuing care group Return to previous living arrangement  PATIENT/FAMIILY INVOLVEMENT: This treatment plan has been presented to and reviewed with the patient, Stephanie Logan, and/or family member, .  The patient and family have been given the opportunity to ask questions and make suggestions.  Stephanie Logan 07/20/2015, 3:37 PM

## 2015-07-20 NOTE — ED Notes (Signed)
Pt tearful and crying while laying on bed when I brought pt one time ativan dose. Discussed with pt why she was feeling so tearful. Pt stated " I feel like I am being selfish being in here. I miss my boys. I feel like my husband is having to handle all of this on his own". I discussed with pt that self care is important especially when a person is feeling depressed as she currently feels.  I discussed with pt that she needs to take care of herself first before she can take care of anyone else, which I stated "it may sound selfish, but it is the best gift that you can give your family, by taking care of yourself, you will be able to be the being the best mom and wife that you are".  Pt stated " I am afraid that I am going to miss my youngest (62 month old) boys first halloween." I suggested that pt try to think of fun things she can do with her boys once she is discharged.  One suggestion which caused the pt to smile and state "that sounds like fun" was for her and the youngest to be in the doorways of the rooms in their home and have the older (2 1/26 yr old) trick or treat to each room.  Pt was sitting on bed smiling and less tearful after discussion.  I informed pt that if she needed something to help her sleep that she did have trazadone ordered. Pt stated "please get me that medicine" , pt was then administered the trazadone, I discussed the need to have good sleep hygiene and to try to get a good nights rest.  Pt sitting on bed, watching tv when I left the room.

## 2015-07-20 NOTE — ED Notes (Signed)
NAD noted at this time. Pt given phone at this time. Respirations even and unlabored. Pt calm and cooperative at this time

## 2015-07-21 ENCOUNTER — Encounter: Payer: Self-pay | Admitting: General Practice

## 2015-07-21 DIAGNOSIS — F332 Major depressive disorder, recurrent severe without psychotic features: Secondary | ICD-10-CM

## 2015-07-21 MED ORDER — CLONAZEPAM 0.5 MG PO TABS
0.5000 mg | ORAL_TABLET | Freq: Two times a day (BID) | ORAL | Status: DC
  Administered 2015-07-21 – 2015-07-22 (×2): 0.5 mg via ORAL
  Filled 2015-07-21 (×2): qty 1

## 2015-07-21 NOTE — Plan of Care (Signed)
Problem: Alteration in mood Goal: LTG-Patient reports reduction in suicidal thoughts (Patient reports reduction in suicidal thoughts and is able to verbalize a safety plan for whenever patient is feeling suicidal)  Outcome: Progressing Patient denies current SI and states she if feeling hopeful.  Goal: LTG-Pt's behavior demonstrates decreased signs of depression (Patient's behavior demonstrates decreased signs of depression to the point the patient is safe to return home and continue treatment in an outpatient setting)  Outcome: Progressing Patient interactive in dayroom with peers, positive and appropriate visit with family during visiting hours.  Goal: STG-Patient is able to discuss feelings and issues (Patient is able to discuss feelings and issues leading to depression)  Outcome: Progressing Open to discussion and the assessment process.

## 2015-07-21 NOTE — [facility] Group Notes (Signed)
[facility] LCSW Group Therapy  07/21/2015 6:45 PM  Type of Therapy:  Group Therapy  Participation Level:  Minimal  Participation Quality:  Attentive  Affect:  Tearful  Cognitive:  Alert  Insight:  Limited  Engagement in Therapy:  Limited  Modes of Intervention:  Discussion, Education, Socialization and Support  Summary of Progress/Problems:Todays topic: Grudges  Patients will be encouraged to discuss their thoughts, feelings, and behaviors as to why one holds on to grudges and reasons why people have grudges. Patients will process the impact of grudges on their daily lives and identify thoughts and feelings related to holding grudges. Patients will identify feelings and thoughts related to what life would look like without grudges. Pt attended group and stayed the entire time. She sat quietly and listened to other group members.   [place] MSW, [place]  07/21/2015, 6:45 PM

## 2015-07-21 NOTE — Progress Notes (Signed)
[facility] MD Progress Note  07/21/2015 2:35 PM Stephanie Logan  MRN:  [id number] Subjective:  Patient indicates that she is feeling better today. She states lower dose of trazodone was helpful for sleep and is not causing her any daytime sedation. She feels her anxiety is better under control and she is more forward thinking about life. She indicates she very much wants discharged tomorrow because it's her youngest child's first Halloween and she wants to be able to participate in that. In addition she states she has therapy set up for this week and wants to be able to attend those appointments. Principal Problem: <principal problem not specified> Diagnosis:   Patient Active Problem List   Diagnosis Date Noted  . Major depression ([place]) [F32.9] 07/20/2015  . Major depressive episode [F32.9] 07/19/2015  . PCOS (polycystic ovarian syndrome) [E28.2] 07/19/2015  . Suicidal ideation [R45.851] 07/19/2015   Total Time spent with patient: 20 minutes  Past Psychiatric History:   Past Medical History:  Past Medical History  Diagnosis Date  . PCOS (polycystic ovarian syndrome)   . Post partum depression     Past Surgical History  Procedure Laterality Date  . Breast fibroadenoma surgery  2008,2010   Family History:  Family History  Problem Relation Age of Onset  . Cancer Maternal Grandmother     brain   Family Psychiatric  History:  Social History:  History  Alcohol Use  . Yes    Comment: occas     History  Drug Use No    Social History   Social History  . Marital Status: Married    Spouse Name: N/A  . Number of Children: N/A  . Years of Education: N/A   Social History Main Topics  . Smoking status: Never Smoker   . Smokeless tobacco: Never Used  . Alcohol Use: Yes     Comment: occas  . Drug Use: No  . Sexual Activity: Yes    Birth Control/ Protection: None   Other Topics Concern  . None   Social History Narrative   Additional Social History:    History of alcohol /  drug use?: No history of alcohol / drug abuse                    Sleep: Good  Appetite:  Good  Current Medications: Current Facility-Administered Medications  Medication Dose Route Frequency Provider Last Rate Last Dose  . acetaminophen (TYLENOL) tablet 650 mg  650 mg Oral Q6H PRN Gonzella Lex, MD   650 mg at 07/20/15 1318  . alum & mag hydroxide-simeth (MAALOX/MYLANTA) 200-200-20 MG/5ML suspension 30 mL  30 mL Oral Q4H PRN Gonzella Lex, MD      . clonazePAM Bobbye [place]) tablet 0.5 mg  0.5 mg Oral BID Marjie Skiff, MD      . FLUoxetine (PROZAC) capsule 20 mg  20 mg Oral Daily Gonzella Lex, MD   20 mg at 07/21/15 0839  . magnesium hydroxide (MILK OF MAGNESIA) suspension 30 mL  30 mL Oral Daily PRN Gonzella Lex, MD      . traZODone (DESYREL) tablet 50 mg  50 mg Oral QHS Marjie Skiff, MD   50 mg at 07/20/15 2146    Lab Results:  Results for orders placed or performed during the hospital encounter of 07/20/15 (from the past 48 hour(s))  Hemoglobin A1c     Status: None   Collection Time: 07/19/15  3:42 PM  Result Value Ref Range  Hgb A1c MFr Bld 5.3 4.0 - 6.0 %  Lipid panel, fasting     Status: Abnormal   Collection Time: 07/19/15  3:42 PM  Result Value Ref Range   Cholesterol 197 0 - 200 mg/dL   Triglycerides 54 <150 mg/dL   HDL 67 >40 mg/dL   Total CHOL/HDL Ratio 2.9 RATIO   VLDL 11 0 - 40 mg/dL   LDL Cholesterol 119 (H) 0 - 99 mg/dL    Comment:        Total Cholesterol/HDL:CHD Risk Coronary Heart Disease Risk Table                     Men   Women  1/2 Average Risk   3.4   3.3  Average Risk       5.0   4.4  2 X Average Risk   9.6   7.1  3 X Average Risk  23.4   11.0        Use the calculated Patient Ratio above and the CHD Risk Table to determine the patient's CHD Risk.        ATP III CLASSIFICATION (LDL):  <100     mg/dL   Optimal  100-129  mg/dL   Near or Above                    Optimal  130-159  mg/dL   Borderline  160-189  mg/dL   High   >190     mg/dL   Very High   TSH     Status: None   Collection Time: 07/19/15  3:42 PM  Result Value Ref Range   TSH 0.792 0.350 - 4.500 uIU/mL    Physical Findings: AIMS: Facial and Oral Movements Muscles of Facial Expression: None, normal Lips and Perioral Area: None, normal Jaw: None, normal Tongue: None, normal,Extremity Movements Upper (arms, wrists, hands, fingers): None, normal Lower (legs, knees, ankles, toes): None, normal, Trunk Movements Neck, shoulders, hips: None, normal, Overall Severity Severity of abnormal movements (highest score from questions above): None, normal Incapacitation due to abnormal movements: None, normal Patient's awareness of abnormal movements (rate only patient's report): No Awareness, Dental Status Current problems with teeth and/or dentures?: No Does patient usually wear dentures?: No  CIWA:  CIWA-Ar Total: 3 COWS:  COWS Total Score: 0  Musculoskeletal: Strength & Muscle Tone: within normal limits Gait & Station: normal Patient leans: N/A  Psychiatric Specialty Exam: Review of Systems  Psychiatric/Behavioral: Negative for depression, suicidal ideas, hallucinations, memory loss and substance abuse. The patient is not nervous/anxious and does not have insomnia.   All other systems reviewed and are negative.   Blood pressure 83/55, pulse 125, temperature 98.9 F (37.2 C), temperature source Oral, resp. rate 18, height 5\' 2"  (1.575 m), weight 59.648 kg (131 lb 8 oz), last menstrual period 06/25/2015, SpO2 99 %.Body mass index is 24.05 kg/(m^2).  General Appearance: Well Groomed  Engineer, water::  Good  Speech:  Normal Rate  Volume:  Normal  Mood:  Better  Affect:  Congruent  Thought Process:  Linear and Logical  Orientation:  Full (Time, Place, and Person)  Thought Content:  Negative  Suicidal Thoughts:  No  Homicidal Thoughts:  No  Memory:  Immediate;   Good Recent;   Good Remote;   Good  Judgement:  Good  Insight:  Good  Psychomotor  Activity:  Negative  Concentration:  Good  Recall:  Good  Fund of Knowledge:Good  Language: Good  Akathisia:  Negative  Handed:  Right  AIMS (if indicated):     Assets:  Communication Skills Desire for Improvement Physical Health Social Support  ADL's:  Intact  Cognition: WNL  Sleep:  Number of Hours: 7   Treatment Plan Summary:  Daily contact with patient to assess and evaluate symptoms and progress in treatment, Medication management and Plan Major depressive disorder, severe without psychotic features-patient just had her Prozac increased from 10 mg to 20 mg in the emergency room here.   Insomnia-we will reduce patient's dose of trazodone from 100 mg to 50 mg as she is reporting excessive sedation in the daytime.   Anxiety-patient reports past response to Klonopin and thus we will prescribe Klonopin 0.5 mg twice daily as needed for anxiety.   Suicidal ideation with intent-Patient is no longer suicidal and seems to exhibit forward thinking about wanting to join her family and engage in outpatient treatment.  Disposition-patient will likely be undergo home, describes has been supportive and has made plans to engage in treatment and already has some appointment schedule. Faith Rogue 07/21/2015, 2:35 PM

## 2015-07-21 NOTE — Progress Notes (Signed)
Pt appears to be adjusting well to the unit. Pt's mood and affect continues to be depressed. No crying spells noted this period.Pt continues to deny having SI and A/V hallucinations. Pt has attended all unit activities.Marland Kitchen

## 2015-07-21 NOTE — [facility] Group Notes (Signed)
[facility] Group Notes:  (Nursing/MHT/Case Management/Adjunct)  Date:  07/21/2015  Time:  9:38 AM  Type of Therapy:  Goal-Setting  Participation Level:  Active  Participation Quality:  Appropriate and Sharing  Affect:  Appropriate  Cognitive:  Appropriate  Insight:  Appropriate  Engagement in Group:  Engaged  Modes of Intervention:  Discussion  Summary of Progress/Problems:  Stephanie Logan 07/21/2015, 9:38 AM

## 2015-07-22 MED ORDER — CLONAZEPAM 0.5 MG PO TABS: 0.5000 mg | ORAL_TABLET | Freq: Two times a day (BID) | ORAL | Status: DC | PRN

## 2015-07-22 MED ORDER — FLUOXETINE HCL 20 MG PO CAPS: 20.0000 mg | ORAL_CAPSULE | Freq: Every day | ORAL | Status: DC

## 2015-07-22 MED ORDER — TRAZODONE HCL 50 MG PO TABS: 50.0000 mg | ORAL_TABLET | Freq: Every day | ORAL | Status: DC

## 2015-07-22 NOTE — Progress Notes (Signed)
Recreation Therapy Notes  Date: 10.31.16 Time: 3:00 pm Location: Craft Room  Group Topic: Wellness  Goal Area(s) Addresses:  Patient will identify at least one item per dimension of health. Patient will examine areas they are deficient in.  Behavioral Response: Attentive, Interactive ,Left early  Intervention: 6 Dimensions of Health  Activity: Patients were given a worksheet with the definitions of the 6 dimensions of health. Patients were given a second worksheet with the 6 dimensions of health on it and instructed to write out at least one item they are currently doing in each dimension.  Education: LRT educated patients on way they can improve each dimension.  Education Outcome: Acknowledges education/In group clarification offered  Clinical Observations/Feedback: Patient completed activity by writing at least 2 items in each dimension. Patient contributed to group discussion by stating what area she was not giving enough attention to. Patient left group at approximately 3:33 pm. Patient did not return to group.  Leonette Monarch, LRT/CTRS 07/22/2015 5:03 PM

## 2015-07-22 NOTE — BHH Group Notes (Signed)
[facility] LCSW Group Therapy  07/22/2015 3:23 PM  Type of Therapy:  Group Therapy  Participation Level:  Minimal  Participation Quality:  Appropriate and Attentive  Affect:  Appropriate  Cognitive:  Alert, Appropriate and Oriented  Insight:  Engaged  Engagement in Therapy:  Engaged  Modes of Intervention:  Socialization and Support  Summary of Progress/Problems: Patient arrived to group late but was attentive and engaged minimally during group discussion however was attentive AEB her body language and eye contact.   Keene Breath, MSW, LCSWA 07/22/2015, 3:23 PM

## 2015-07-22 NOTE — Progress Notes (Signed)
D: Patient denies SI/HI/AVH.  Patient affect and mood are anxious.  Patient did attend evening group. Patient visible on the milieu. No distress noted. A: Support and encouragement offered. Scheduled medications given to pt. Q 15 min checks continued for patient safety. R: Patient receptive. Patient remains safe on the unit.   

## 2015-07-22 NOTE — Progress Notes (Signed)
  [facility] Adult Case Management Discharge Plan :  Will you be returning to the same living situation after discharge:  Yes,  home with husband At discharge, do you have transportation home?: Yes,  patient's husband will pick up Do you have the ability to pay for your medications: Yes,  patient has [employer]  Release of information consent forms completed and in the chart;  Patient's signature needed at discharge.  Patient to Follow up at: Follow-up Information    Follow up with [facility] on 07/23/2015.   Why:  For follow-up care appt Tuesday 07/23/15 at 11:00am for therapy with [facility]   Contact information:   [address] [place], [place] [address] Ph [phone] Fax [phone]      Follow up with [facility] on 07/26/2015.   Why:  For follow-up care appt Friday 07/26/15 at 9:00am (walk in hours are M-F 9-4)   Contact information:   [place], [place] Fax [phone]      Follow up with [facility] on 07/25/2015.   Why:  For follow-up care appt Thurs 07/25/15 at 1:00pm with [facility] information:   [address] [place], [place] [address] Ph [phone] Fax [phone]      Patient denies SI/HI: Yes,  patient denies SI/HI    Safety Planning and Suicide Prevention discussed: Yes,  SPE discussed with patient and patient's husband has removed firearms from the home  Have you used any form of tobacco in the last 30 days? (Cigarettes, Smokeless Tobacco, Cigars, and/or Pipes): No  Has patient been referred to the Quitline?: N/A patient is not a smoker  Carmell Austria T, MSW, LCSWA 07/22/2015, 3:14 PM

## 2015-07-22 NOTE — BHH Suicide Risk Assessment (Addendum)
[facility] Discharge Suicide Risk Assessment   Demographic Factors:  Caucasian  Total Time spent with patient: 30 minutes  Psychiatric Specialty Exam: Physical Exam  ROS  Have you used any form of tobacco in the last 30 days? (Cigarettes, Smokeless Tobacco, Cigars, and/or Pipes): No  Has this patient used any form of tobacco in the last 30 days? (Cigarettes, Smokeless Tobacco, Cigars, and/or Pipes) No  Mental Status Per Nursing Assessment::   On Admission:     Current Mental Status by Physician: mood improved, hopeful, future oriented, calm and cooperative.  Denies SI, supportive family, no access to guns.  Loss Factors: marital conflict  Historical Factors: NA  Risk Reduction Factors:   Responsible for children under 80 years of age, Living with another person, especially a relative and Positive social support  Continued Clinical Symptoms:  Depression:   Insomnia  Cognitive Features That Contribute To Risk:  None    Suicide Risk:  Minimal: No identifiable suicidal ideation.  Patients presenting with no risk factors but with morbid ruminations; may be classified as minimal risk based on the severity of the depressive symptoms  Principal Problem: Severe episode of recurrent major depressive disorder, without psychotic features [facility]) Discharge Diagnoses:  Patient Active Problem List   Diagnosis Date Noted  . Severe episode of recurrent major depressive disorder, without psychotic features ([place]) [F33.2]   . PCOS (polycystic ovarian syndrome) [E28.2] 07/19/2015    Follow-up Information    Follow up with [facility] on 07/23/2015.   Why:  For follow-up care appt Tuesday 07/23/15 at 11:00am for therapy with [facility]   Contact information:   [address] [place], [place] [address] Ph [phone] Fax [phone]      Follow up with [facility] on 07/26/2015.   Why:  For follow-up care appt Friday 07/26/15 at 9:00am (walk in hours are M-F 9-4)   Contact information:   [place], [place] Fax [phone]      Follow up with [facility] on 07/25/2015.   Why:  For follow-up care appt Thurs 07/25/15 at 1:00pm with [facility] information:   [address] [place], [place] [address] Ph [phone] Fax [phone]       Is patient on multiple antipsychotic therapies at discharge:  No   Has Patient had three or more failed trials of antipsychotic monotherapy by history:  No  Recommended Plan for Multiple Antipsychotic Therapies: NA    Hildred Priest 07/22/2015, 3:01 PM

## 2015-07-22 NOTE — BHH Suicide Risk Assessment (Signed)
Stephanie Logan INPATIENT:  Family/Significant Other Suicide Prevention Education  Suicide Prevention Education:  Education Completed; Stephanie Logan (husband) [phone] has been identified by the patient as the family member/significant other with whom the patient will be residing, and identified as the person(s) who will aid the patient in the event of a mental health crisis (suicidal ideations/suicide attempt).  With written consent from the patient, the family member/significant other has been provided the following suicide prevention education, prior to the and/or following the discharge of the patient.  The suicide prevention education provided includes the following:  Suicide risk factors  Suicide prevention and interventions  National Suicide Hotline telephone number  [facility] assessment telephone number  [facility] Emergency Assistance [place] and/or Residential Mobile Crisis Unit telephone number  Request made of family/significant other to:  Remove weapons (e.g., guns, rifles, knives), all items previously/currently identified as safety concern.    Remove drugs/medications (over-the-counter, prescriptions, illicit drugs), all items previously/currently identified as a safety concern.  The family member/significant other verbalizes understanding of the suicide prevention education information provided.  The family member/significant other agrees to remove the items of safety concern listed above.  Keene Breath, MSW, LCSWA 07/22/2015, 2:42 PM

## 2015-07-22 NOTE — Discharge Summary (Addendum)
Physician Discharge Summary Note  Patient:  Stephanie Logan is an 24 y.o., female MRN:  [id number] DOB:  June 05, 1991 Patient phone:  [phone] (home)  Patient address:   [address]  [place] [address],  Total Time spent with patient: 30 minutes  Date of Admission:  07/20/2015 Date of Discharge: 07/22/15  Reason for Admission:  suicidality  Principal Problem: Severe episode of recurrent major depressive disorder, without psychotic features [facility]) Discharge Diagnoses: Patient Active Problem List   Diagnosis Date Noted  . Severe episode of recurrent major depressive disorder, without psychotic features ([place]) [F33.2]   . PCOS (polycystic ovarian syndrome) [E28.2] 07/19/2015    Musculoskeletal: Strength & Muscle Tone: within normal limits Gait & Station: normal Patient leans: N/A  Psychiatric Specialty Exam: Physical Exam  Constitutional: She is oriented to person, place, and time. She appears well-developed and well-nourished.  HENT:  Head: Normocephalic and atraumatic.  Eyes: Conjunctivae and EOM are normal.  Neck: Normal range of motion.  Respiratory: Effort normal.  Musculoskeletal: Normal range of motion.  Neurological: She is alert and oriented to person, place, and time.  Skin: Skin is warm and dry.    Review of Systems  Constitutional: Negative.   HENT: Negative.   Eyes: Negative.   Respiratory: Negative.   Cardiovascular: Negative.   Gastrointestinal: Negative.   Genitourinary: Negative.   Musculoskeletal: Negative.   Skin: Negative.   Neurological: Negative.   Endo/Heme/Allergies: Negative.   Psychiatric/Behavioral: Negative.     Blood pressure 108/74, pulse 79, temperature 98.9 F (37.2 C), temperature source Oral, resp. rate 18, height $RemoveBe'5\' 2"'[id number]$  (1.575 m), weight 59.648 kg (131 lb 8 oz), last menstrual period 06/25/2015, SpO2 99 %.Body mass index is 24.05 kg/(m^2).  General Appearance: Well Groomed  Engineer, water::  Good  Speech:  Clear and  Coherent  Volume:  Normal  Mood:  Dysphoric  Affect:  Congruent  Thought Process:  Linear  Orientation:  Full (Time, Place, and Person)  Thought Content:  Hallucinations: None  Suicidal Thoughts:  No  Homicidal Thoughts:  No  Memory:  Immediate;   Good Recent;   Good Remote;   Good  Judgement:  Good  Insight:  Good  Psychomotor Activity:  Normal  Concentration:  Good  Recall:  NA  Fund of Knowledge:Good  Language: Good  Akathisia:  No  Handed:    AIMS (if indicated):     Assets:  Communication Skills Desire for Improvement Financial Resources/Insurance Housing Intimacy Leisure Time Physical Health Resilience Social Support Talents/Skills Transportation Vocational/Educational  ADL's:  Intact  Cognition: WNL  Sleep:  Number of Hours: 6.75    History of Present Illness:: Patient indicates that since her 29-month-old was born she had been experiencing depression. She reports depressive symptoms of difficulty sleeping, anhedonia although she states she did have fun on a vacation 2 weeks ago. She endorses feelings of guilt and worthlessness. She reports low energy, poor appetite, depressed mood and suicidal ideation. She indicated that things have worsened over the past week. She indicates that this week she also informed her husband of a excellent discretion that occurred 6 years ago. She has significant guilt around this issue. She states that he accepted the news graciously and this further makes her feel unworthy of him.  She states that she saw her midwife 3 weeks ago and was placed on Prozac although it appears it was a low dose of 10 mg. In the emergency room Dr. Carlena Logan increased this dose to 20 mg.  He states that she also received treatment with Prozac after the birth of her first son who is now 59-1/2 years old. She states she took the Prozac for about 2 months and it was helpful then in terms of depressed mood.  She denies any symptoms consistent with mania and denies  any symptoms consistent with auditory or visual hallucinations. She does state that in regards to her suicidal ideation she feels like she is having 2 different thoughts about it. She states that one side of her speaks to the fact she has a good life, good husband and 2 kids and thus no reason for suicide. She states the other side speaks to her being depressed, worthless and suicidal. She states they're not frank voices but she describes him as very strong conflicting thoughts.  She states that she began [facility] [place] this past week and has an upcoming couple's counseling session scheduled for 07/23/2015 as well as another individual therapy session scheduled for later next week.  Associated Signs/Symptoms: Depression Symptoms: depressed mood, anhedonia, insomnia, feelings of worthlessness/guilt, suicidal thoughts with specific plan, anxiety, loss of energy/fatigue, decreased appetite, (Hypo) Manic Symptoms: Denies Anxiety Symptoms: Excessive Worry, she describes symptoms of palpitations, racing thoughts and feeling like she is not existing in reality. She states that typically this can last a full day but does not happen every day Psychotic Symptoms: None PTSD Symptoms: Negative Total Time spent with patient: 1 hour  Past Psychiatric History: She denies any past psychiatric hospitalizations. She denies any previous suicide attempts. As discussed above she has been started on Prozac 3 weeks ago. She also had a previous trial of Prozac for about 2 months 2 and half years ago. She has recently started therapy as discussed above. She states she's never had medication management from a psychiatrist.  Risk to Self:   currently Risk to Others:   none Prior Inpatient Therapy:   none Prior Outpatient Therapy:   just recently started  Alcohol Screening: 1. How often do you have a drink containing alcohol?: 2 to 3 times a week 2. How many drinks  containing alcohol do you have on a typical day when you are drinking?: 3 or 4 3. How often do you have six or more drinks on one occasion?: Never Preliminary Score: 1 Brief Intervention: Yes Substance Abuse History in the last 12 months: No. Consequences of Substance Abuse: NA Previous Psychotropic Medications: Yes  Psychological Evaluations: No  Past Medical History:  Past Medical History  Diagnosis Date  . PCOS (polycystic ovarian syndrome)   . Post partum depression     Past Surgical History  Procedure Laterality Date  . Breast fibroadenoma surgery  2008,2010   Family History:  Family History  Problem Relation Age of Onset  . Cancer Maternal Grandmother     brain   Family Psychiatric History: Patient states that her twins Sr. is treated for depression and her twin sister had suicidal ideation in 2008 or 2009 Social History:  History  Alcohol Use  . Yes    Comment: occas    History  Drug Use No    Social History   Social History  . Marital Status: Married    Spouse Name: N/A  . Number of Children: N/A  . Years of Education: N/A   Social History Main Topics  . Smoking status: Never Smoker   . Smokeless tobacco: Never Used  . Alcohol Use: Yes     Comment: occas  .  Drug Use: No  . Sexual Activity: Yes    Birth Control/ Protection: None   Other Topics Concern  . None   Social History Narrative   Additional Social History:   patient is married. She has a 97 month old son and a 71-1/2-year-old son. She states her husband was in the [employer] and lost both of his legs. Patient has stopped working to take care of him. She has had some social stressors and that they had a house built for them through a veterans housing program however it ended up having so many structural defects that they were not able to reside there. She states there were scheduled to move in there in  July but with all the problems they were not able to and thus they're in some temporary housing which is not desirable to the patient.  Asian describes her childhood as fairly good. She states her parents went through a difficult divorce and she had some court mandated treatment for/therapy during their divorce. She denies any abuse growing up. She has the twins Sr. discussed above and one brother. History of alcohol / drug use?: No history of alcohol / drug abuse                         Hospital Course:   Major depressive disorder, severe without psychotic features: patient has been continued on prozac but dose was increased to 20 mg po q day.  Anxiety: continue klonopin 0.5 mg po bid.  Insomnia: pt has been started on trazodone 50 mg po qhs  Suicidal ideation with intent-Patient is no longer suicidal and seems to exhibit forward thinking about wanting to join her family and engage in outpatient treatment.  Marital conflict: pt's husband in agreement with attending family therapy.  He is supportive and wants to work things out.  Disposition: pt will be d/c back to her home today.   SW spoke with patient's husband who states he has removed all guns from the house.     On the day of discharge pt reported feeling improved, no longer having SI, HI or a/vh.  She is hopeful and future oriented.   During her stay she participated in programming.  There were no reports of behavioral disturbances.  No need for seclusion, restraints or forced medications.   Discharge Vitals:   Blood pressure 108/74, pulse 79, temperature 98.9 F (37.2 C), temperature source Oral, resp. rate 18, height _0  (1.575 m), weight 59.648 kg (131 lb 8 oz), last menstrual period 06/25/2015, SpO2 99 %. Body mass index is 24.05 kg/(m^2).  Lab Results:   Results for RONIYAH, LLORENS (MRN [id number]) as of 07/22/2015 14:49  Ref. Range 07/03/2015 15:24 07/03/2015 15:49 07/03/2015 15:59 07/03/2015 16:00  07/19/2015 15:36 07/19/2015 15:42  Sodium Latest Ref Range: 135-145 mmol/L      137  Potassium Latest Ref Range: 3.5-5.1 mmol/L      3.7  Chloride Latest Ref Range: 101-111 mmol/L      101  CO2 Latest Ref Range: 22-32 mmol/L      26  BUN Latest Ref Range: 6-20 mg/dL      20  Creatinine Latest Ref Range: 0.44-1.00 mg/dL      0.81  Calcium Latest Ref Range: 8.9-10.3 mg/dL      9.9  EGFR (Non-African Amer.) Latest Ref Range: >60 mL/min      >60  EGFR (African American) Latest Ref Range: >60 mL/min      >  60  Glucose Latest Ref Range: 65-99 mg/dL      74  Anion gap Latest Ref Range: 5-15       10  Alkaline Phosphatase Latest Ref Range: 38-126 U/L      66  Albumin Latest Ref Range: 3.5-5.0 g/dL      5.2 (H)  AST Latest Ref Range: 15-41 U/L      23  ALT Latest Ref Range: 14-54 U/L      16  Total Protein Latest Ref Range: 6.5-8.1 g/dL      9.0 (H)  Total Bilirubin Latest Ref Range: 0.3-1.2 mg/dL      0.6  Cholesterol Latest Ref Range: 0-200 mg/dL      197  Triglycerides Latest Ref Range: <150 mg/dL      54  HDL Cholesterol Latest Ref Range: >40 mg/dL      67  LDL (calc) Latest Ref Range: 0-99 mg/dL      119 (H)  VLDL Latest Ref Range: 0-40 mg/dL      11  Total CHOL/HDL Ratio Latest Units: RATIO      2.9  Ferritin Latest Ref Range: 15-150 ng/mL    10 (L)    Vit D, 25-Hydroxy Latest Ref Range: 30.0-100.0 ng/mL   32.5     Vitamin B-12 Latest Ref Range: 211-946 pg/mL   317     WBC Latest Ref Range: 3.6-11.0 K/uL   6.5   8.0  RBC Latest Ref Range: 3.80-5.20 MIL/uL   4.56   5.05  Hemoglobin Latest Ref Range: 12.0-16.0 g/dL   11.9   13.3  HCT Latest Ref Range: 35.0-47.0 %   36.6   40.1  MCV Latest Ref Range: 80.0-100.0 fL   80   79.6 (L)  MCH Latest Ref Range: 26.0-34.0 pg   26.1 (L)   26.3  MCHC Latest Ref Range: 32.0-36.0 g/dL   32.5   33.0  RDW Latest Ref Range: 11.5-14.5 %   15.7 (H)   15.6 (H)  Platelets Latest Ref Range: 150-440 K/uL   681   275  Salicylate Lvl Latest Ref Range: 2.8-30.0  mg/dL      <4.0  Acetaminophen Latest Ref Range: 10-30 ug/mL      <10 (L)  Hemoglobin A1C Latest Ref Range: 4.0-6.0 %      5.3  Preg Test, Ur Latest Ref Range: NEGATIVE   Negative   NEGATIVE   TSH Latest Ref Range: 0.350-4.500 uIU/mL      0.792  Bilirubin, UA Unknown neg       Clarity, UA Unknown clear       Color, UA Unknown pale yellow       Glucose Unknown neg       Ketones, UA Unknown neg       Leukocytes, UA Latest Ref Range: Negative  Negative       Nitrite, UA Unknown neg       pH, UA Unknown 7.0       Protein, UA Unknown neg       RBC, UA Unknown small       Specific Gravity, UA Unknown 1.010       Urobilinogen, UA Unknown 0.2       Alcohol, Ethyl (B) Latest Ref Range: <5 mg/dL      <5  Amphetamines, Ur Screen Latest Ref Range: NONE DETECTED       NONE DETECTED  Barbiturates, Ur Screen Latest Ref Range: NONE DETECTED       NONE  DETECTED  Benzodiazepine, Ur Scrn Latest Ref Range: NONE DETECTED       NONE DETECTED  Cocaine Metabolite,Ur [place] Latest Ref Range: NONE DETECTED       NONE DETECTED  Methadone Scn, Ur Latest Ref Range: NONE DETECTED       NONE DETECTED  MDMA (Ecstasy)Ur Screen Latest Ref Range: NONE DETECTED       NONE DETECTED  Cannabinoid 50 Ng, Ur [place] Latest Ref Range: NONE DETECTED       NONE DETECTED  Opiate, Ur Screen Latest Ref Range: NONE DETECTED       NONE DETECTED  Phencyclidine (PCP) Ur S Latest Ref Range: NONE DETECTED       NONE DETECTED  Tricyclic, Ur Screen Latest Ref Range: NONE DETECTED       NONE DETECTED  URINE CULTURE Unknown   Rpt     Urine Culture, Routine Unknown   Final report     Urine Culture result 1 Unknown   No growth        Physical Findings: AIMS: Facial and Oral Movements Muscles of Facial Expression: None, normal Lips and Perioral Area: None, normal Jaw: None, normal Tongue: None, normal,Extremity Movements Upper (arms, wrists, hands, fingers): None, normal Lower (legs, knees, ankles, toes): None, normal, Trunk Movements Neck,  shoulders, hips: None, normal, Overall Severity Severity of abnormal movements (highest score from questions above): None, normal Incapacitation due to abnormal movements: None, normal Patient's awareness of abnormal movements (rate only patient's report): No Awareness, Dental Status Current problems with teeth and/or dentures?: No Does patient usually wear dentures?: No  CIWA:  CIWA-Ar Total: 3 COWS:  COWS Total Score: 0        Medication List    STOP taking these medications        ibuprofen 800 MG tablet  Commonly known as:  ADVIL,MOTRIN      TAKE these medications      Indication   clonazePAM 0.5 MG tablet  Commonly known as:  KLONOPIN  Take 0.5 mg by mouth 2 (two) times daily as needed for anxiety.  Notes to Patient:  anxiety      FLUoxetine 20 MG capsule  Commonly known as:  PROZAC  Take 1 capsule (20 mg total) by mouth daily.  Notes to Patient:  depression      traZODone 50 MG tablet  Commonly known as:  DESYREL  Take 1 tablet (50 mg total) by mouth at bedtime.  Notes to Patient:  insomnia        Follow-up Information    Follow up with [facility] on 07/23/2015.   Why:  For follow-up care appt Tuesday 07/23/15 at 11:00am for therapy with [facility]   Contact information:   [address] [place], [place] [address] Ph [phone] Fax [phone]      Follow up with [facility] on 07/26/2015.   Why:  For follow-up care appt Friday 07/26/15 at 9:00am (walk in hours are M-F 9-4)   Contact information:   [place], [place] Fax [phone]      Follow up with [facility] on 07/25/2015.   Why:  For follow-up care appt Thurs 07/25/15 at 1:00pm with [facility] information:   [address] [place],  [address] Ph [phone] Fax [phone]      Total Discharge Time: 30 minutes  Signed: Hildred Priest 07/22/2015, 2:58 PM

## 2015-07-22 NOTE — BHH Counselor (Signed)
Adult Comprehensive Assessment  Patient ID: Stephanie Logan, female   DOB: Feb 01, 1991, 24 y.o.   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 yo and 8 mo sons  Living/Environment/Situation:  Living Arrangements: Spouse/significant other What is atmosphere in current home: Comfortable  Family History:  Marital status: Married What types of issues is patient dealing with in the relationship?: patient cheated on her husband and still feels guilty after years Does patient have children?: Yes How many children?: 2 How is patient's relationship with their children?: good relationship with 2 sons ages 92mo and 35 yo  Childhood History:  By whom was/is the patient raised?: Both parents  Education:  Highest grade of school patient has completed: N/A Currently a student?: No Name of school: N/A Learning disability?: No  Employment/Work Situation:   Employment situation: Unemployed Has patient ever been in the [employer]?: No (patient's husband is double Freight forwarder and receives [employer] disability) Has patient ever served in Recruitment consultant?: No  Financial Resources:   Financial resources: Income from spouse Does patient have a Programmer, applications or guardian?: No  Alcohol/Substance Abuse:      Social Support System:   Patient's Community Support System: Good Describe Community Support System: patient has supprotive husband and 2 chidlren and is connected with Environmental manager in [place] for counseling Type of faith/religion: Christian  Leisure/Recreation:      Strengths/Needs:      Discharge Plan:   Does patient have access to transportation?: Yes Will patient be returning to same living situation after discharge?: Yes Currently receiving community mental health services: Yes (From Whom) If no, would patient like referral  for services when discharged?: Yes (What county[place]) Does patient have financial barriers related to discharge medications?: No  Summary/Recommendations: Patient is a married 24 yo wf admitted for depression with SI and plan to shoot self with a gun. Patient's husband is a double Tax adviser and reports that he has removed guns from the home and is wanting to work through issues of his wife having extramarital affairs years ago. Patient has reoccurring guilt about her behavior and now is dealing with post partum with an 45mo and 2 yo sons. Patient wants to discharge home to be with her family. Patient has an appt with [facility] in [place] on Tuesday and Thursday this week for therapy and will need psychiatric follow up at discharge. Patient reports no other needs and that she can return home and they survive on her husbands military disability. Patient is encouraged to participate in group therapy, medication managementt, and therapeutic milieu.     Keene Breath., MSW, Latanya Presser  07/22/2015

## 2015-07-22 NOTE — Progress Notes (Signed)
Patient discharged home with husband. Instructions reviewed. Patient verbalized understanding of meds and follow up appointments. Denies SI/HI/AVH. No belongings kept by staff.

## 2015-07-25 ENCOUNTER — Encounter: Payer: Self-pay | Admitting: Obstetrics and Gynecology

## 2015-08-14 ENCOUNTER — Encounter: Payer: Self-pay | Admitting: Obstetrics and Gynecology

## 2015-08-14 ENCOUNTER — Ambulatory Visit (INDEPENDENT_AMBULATORY_CARE_PROVIDER_SITE_OTHER): Admitting: Obstetrics and Gynecology

## 2015-08-14 VITALS — BP 105/70 | HR 64 | Wt 134.5 lb

## 2015-08-14 DIAGNOSIS — D649 Anemia, unspecified: Secondary | ICD-10-CM

## 2015-08-14 MED ORDER — FUSION PLUS PO CAPS: 1.0000 | ORAL_CAPSULE | Freq: Every day | ORAL | Status: DC

## 2015-08-14 MED ORDER — FLUOXETINE HCL 40 MG PO CAPS: 40.0000 mg | ORAL_CAPSULE | Freq: Every day | ORAL | Status: DC

## 2015-08-14 NOTE — Progress Notes (Signed)
Patient ID: Stephanie Logan, female   DOB: 1991/02/03, 24 y.o.   MRN: [id number]  Here for medication follow up for depression and anemia S: feeling much since increasing dose of prozac after hospitalization 3 weeks ago, never started any iron medications, denies any current suicidal ideations.  staes she feels the best she has in years. Is sleeping well.  O: A&O x4 Well groomed female in no distress HRR, skin warm and dry slightly pale.  A: depression under good control with SSRI Anemia  P: increase refill to Prozac 82m daily Sent in rx for Fusion Plus to take daily Will RTC in 8 weeks to redraw labs, and needs AE end of next May.  Stephanie Logan [place] CNM

## 2015-09-11 ENCOUNTER — Other Ambulatory Visit: Payer: Self-pay | Admitting: Obstetrics and Gynecology

## 2015-09-11 ENCOUNTER — Telehealth: Payer: Self-pay | Admitting: Obstetrics and Gynecology

## 2015-09-11 MED ORDER — LINACLOTIDE 145 MCG PO CAPS: 145.0000 ug | ORAL_CAPSULE | Freq: Every day | ORAL | Status: DC

## 2015-09-11 NOTE — Telephone Encounter (Signed)
Any suggestions>?

## 2015-09-11 NOTE — Telephone Encounter (Signed)
Please let her know I sent in a RX for Linzess, and set samples up front if she wants them (145mg ) to take daily until regular BMs and then can take prn or continue daily.

## 2015-09-11 NOTE — Telephone Encounter (Signed)
PT CALLED AND SHE IS HAVING TROUBLE HAVING BM.Marland KitchenSHE SAID ITS BEEN GOING ON FOR ABOUT 3 WEEKS, SHE HAS DONE MIRALX AND STOOL SOFTNESS, BUT SHE DOES GET SOME RELIEF WITH MILKA MAG BUT NOT ENOUGH TO REALLY CLEAN HER OUT, SHE IS NOT PREGNANT, SHE IS EATING A LOT OF FRUITS AND VEGGIES WITH MULTI GRAIN AND DRINKING A LOT OF WATER WITH HIGH FIBER, HER LAST BM WAS ABOUT 4 DAYS AGO BUT IT WAS ONLY A LITTLE BIT. SHE HAS BEEN TAKING [place] EVERYDAY SINCE THEN AND HASN'T HAD ANY LUCK.

## 2015-09-13 ENCOUNTER — Other Ambulatory Visit: Payer: Self-pay | Admitting: *Deleted

## 2015-09-13 ENCOUNTER — Telehealth: Payer: Self-pay | Admitting: Obstetrics and Gynecology

## 2015-09-13 MED ORDER — LINACLOTIDE 145 MCG PO CAPS: 145.0000 ug | ORAL_CAPSULE | Freq: Every day | ORAL | Status: DC

## 2015-09-13 NOTE — Telephone Encounter (Signed)
Done-ac 

## 2015-09-13 NOTE — Telephone Encounter (Signed)
Patient needs script for linzess sent to a different pharmacy. [facility] [address] [facility]. She would like it sent to the [facility] [address]. Thanks

## 2015-09-13 NOTE — Telephone Encounter (Signed)
Faxed to her pharmacy of choice

## 2015-12-09 ENCOUNTER — Emergency Department
Admission: EM | Admit: 2015-12-09 | Discharge: 2015-12-09 | Disposition: A | Discharge status: Home / Self Care | Attending: Emergency Medicine | Admitting: Emergency Medicine

## 2015-12-09 ENCOUNTER — Encounter: Payer: Self-pay | Admitting: Emergency Medicine

## 2015-12-09 DIAGNOSIS — E282 Polycystic ovarian syndrome: Secondary | ICD-10-CM | POA: Insufficient documentation

## 2015-12-09 DIAGNOSIS — F322 Major depressive disorder, single episode, severe without psychotic features: Secondary | ICD-10-CM | POA: Diagnosis not present

## 2015-12-09 DIAGNOSIS — M62838 Other muscle spasm: Secondary | ICD-10-CM | POA: Insufficient documentation

## 2015-12-09 DIAGNOSIS — M542 Cervicalgia: Secondary | ICD-10-CM | POA: Insufficient documentation

## 2015-12-09 DIAGNOSIS — Z79899 Other long term (current) drug therapy: Secondary | ICD-10-CM | POA: Diagnosis not present

## 2015-12-09 LAB — POCT RAPID STREP A: STREPTOCOCCUS, GROUP A SCREEN (DIRECT): NEGATIVE

## 2015-12-09 MED ORDER — NAPROXEN 500 MG PO TABS: 500.0000 mg | ORAL_TABLET | Freq: Two times a day (BID) | ORAL | Status: DC

## 2015-12-09 MED ORDER — CYCLOBENZAPRINE HCL 10 MG PO TABS: 10.0000 mg | ORAL_TABLET | Freq: Three times a day (TID) | ORAL | Status: DC | PRN

## 2015-12-09 MED ORDER — KETOROLAC TROMETHAMINE 30 MG/ML IJ SOLN
30.0000 mg | Freq: Once | INTRAMUSCULAR | Status: AC
  Administered 2015-12-09: 30 mg via INTRAMUSCULAR
  Filled 2015-12-09: qty 1

## 2015-12-09 NOTE — ED Notes (Signed)
Pt discharged home after verbalizing understanding of discharge instructions; nad noted. 

## 2015-12-09 NOTE — ED Provider Notes (Signed)
[facility] Emergency Department Provider Note  ____________________________________________  Time seen: Approximately 11:56 AM  I have reviewed the triage vital signs and the nursing notes.   HISTORY  Chief Complaint Neck Pain    HPI Stephanie Logan is a 25 y.o. female , NAD, presents to the emergency department with 2 day history of her signing neck pain. Had URI symptoms including sore throat, fever, aches 1 week ago that has resolved. Also notes that she had some neck and shoulder pain approximately 1 month ago when she lifted her child. So she felt a pop but the pain was minimal. Pain is increased over the last couple of days. Denies any numbness, weakness, tingling. Has not had any visual changes or headache. Does not had any falls, traumas or other blunt injuries.   Past Medical History  Diagnosis Date  . PCOS (polycystic ovarian syndrome)   . Post partum depression     Patient Active Problem List   Diagnosis Date Noted  . Severe episode of recurrent major depressive disorder, without psychotic features ([place])   . PCOS (polycystic ovarian syndrome) 07/19/2015    Past Surgical History  Procedure Laterality Date  . Breast fibroadenoma surgery  2008,2010    Current Outpatient Rx  Name  Route  Sig  Dispense  Refill  . clonazePAM (KLONOPIN) 0.5 MG tablet   Oral   Take 1 tablet (0.5 mg total) by mouth 2 (two) times daily as needed for anxiety.   45 tablet   0   . cyclobenzaprine (FLEXERIL) 10 MG tablet   Oral   Take 1 tablet (10 mg total) by mouth 3 (three) times daily as needed for muscle spasms.   21 tablet   0   . FLUoxetine (PROZAC) 40 MG capsule   Oral   Take 1 capsule (40 mg total) by mouth daily.   60 capsule   3   . Iron-FA-B Cmp-C-Biot-Probiotic (FUSION PLUS) CAPS   Oral   Take 1 capsule by mouth daily.   60 capsule   1   . Linaclotide (LINZESS) 145 MCG CAPS capsule   Oral   Take 1 capsule (145 mcg total) by mouth  daily.   30 capsule   2   . naproxen (NAPROSYN) 500 MG tablet   Oral   Take 1 tablet (500 mg total) by mouth 2 (two) times daily with a meal.   14 tablet   0   . traZODone (DESYREL) 50 MG tablet   Oral   Take 1 tablet (50 mg total) by mouth at bedtime.   30 tablet   0     Allergies Ciprofloxacin  Family History  Problem Relation Age of Onset  . Cancer Maternal Grandmother     brain    Social History Social History  Substance Use Topics  . Smoking status: Never Smoker   . Smokeless tobacco: Never Used  . Alcohol Use: Yes     Comment: occas     Review of Systems  Constitutional: No fever/chills. Eyes: No visual changes.  ENT: Sore throat has resolved. Cardiovascular: No chest pain. Respiratory:  No shortness of breath. No wheezing.  Gastrointestinal: No abdominal pain.  No nausea, vomiting.   Musculoskeletal: Positive neck and shoulder pain. Negative for back pain.  Skin: Negative for rash, redness, swelling. Neurological: Negative for headaches, focal weakness or numbness. 10-point ROS otherwise negative.  ____________________________________________   PHYSICAL EXAM:  VITAL SIGNS: ED Triage Vitals  Enc Vitals Group  BP 12/09/15 1033 110/70 mmHg     Pulse Rate 12/09/15 1033 72     Resp 12/09/15 1033 18     Temp 12/09/15 1033 98.1 F (36.7 C)     Temp Source 12/09/15 1033 Oral     SpO2 12/09/15 1033 100 %     Weight 12/09/15 1033 134 lb (60.782 kg)     Height 12/09/15 1033 5\' 3"  (1.6 m)     Head Cir --      Peak Flow --      Pain Score 12/09/15 1034 7     Pain Loc --      Pain Edu? --      Excl. in [place]? --     Constitutional: Alert and oriented. Well appearing and in no acute distress. Eyes: Conjunctivae are normal. PERRL. EOMI without pain.  Head: Atraumatic. ENT:      Ears:       Nose: No congestion/rhinnorhea.      Mouth/Throat: Mucous membranes are moist.  Neck: No stridor. Supple with full range of motion. Mild tenderness to  palpation about the paraspinal muscles about the cervical spine. Trapezial muscle spasm noted proximally. Hematological/Lymphatic/Immunilogical: No cervical lymphadenopathy. Cardiovascular: Normal rate, regular rhythm. Normal S1 and S2.  Good peripheral circulation. Respiratory: Normal respiratory effort without tachypnea or retractions. Lungs CTAB. Musculoskeletal: No tenderness to palpation about the thoracic spine.  Neurologic:  Normal speech and language. No gross focal neurologic deficits are appreciated.  Skin:  Skin is warm, dry and intact. No rash noted. Psychiatric: Mood and affect are normal. Speech and behavior are normal. Patient exhibits appropriate insight and judgement.   ____________________________________________   LABS (all labs ordered are listed, but only abnormal results are displayed)  Labs Reviewed  POCT RAPID STREP A   ____________________________________________  EKG  None ____________________________________________  RADIOLOGY  None ____________________________________________    PROCEDURES  Procedure(s) performed: None    Medications  ketorolac (TORADOL) 30 MG/ML injection 30 mg (30 mg Intramuscular Given 12/09/15 1151)   Patient notes improvement in neck pain since administration of Toradol and giving her ice to apply.  ____________________________________________   INITIAL IMPRESSION / ASSESSMENT AND PLAN / ED COURSE  Patient's diagnosis is consistent with cervicalgia with trapezial muscle spasm. Patient will be discharged home with prescriptions for naproxen and Flexeril to take as directed. May take Tylenol in conjunction as needed for pain. Continue light range of motion and stretching as modeled during treatment today. Patient is to follow up with [facility] if symptoms persist past this treatment course. Patient is given ED precautions to return to the ED for any worsening or new symptoms.     ____________________________________________  FINAL CLINICAL IMPRESSION(S) / ED DIAGNOSES  Final diagnoses:  Trapezius muscle spasm  Cervicalgia      NEW MEDICATIONS STARTED DURING THIS VISIT:  New Prescriptions   CYCLOBENZAPRINE (FLEXERIL) 10 MG TABLET    Take 1 tablet (10 mg total) by mouth 3 (three) times daily as needed for muscle spasms.   NAPROXEN (NAPROSYN) 500 MG TABLET    Take 1 tablet (500 mg total) by mouth 2 (two) times daily with a meal.         Braxton Feathers, PA-C 12/09/15 1222  Lisa Roca, MD 12/09/15 [phone]

## 2015-12-09 NOTE — ED Notes (Addendum)
See triage note   States she had body aches and sore throat last week  Now having pain to neck   Unsure of injury  But states she felt a pop when picking up her child a few weeks ago

## 2015-12-09 NOTE — ED Notes (Signed)
States burning in her neck shooting up to her head and down to her shoulders and right hand, states she is nauseas and dizzy as well from the pain

## 2015-12-09 NOTE — Discharge Instructions (Signed)
Muscle Cramps and Spasms    Muscle cramps and spasms occur when a muscle or muscles tighten and you have no control over this tightening (involuntary muscle contraction). They are a common problem and can develop in any muscle. The most common place is in the calf muscles of the leg. Both muscle cramps and muscle spasms are involuntary muscle contractions, but they also have differences:  Muscle cramps are sporadic and painful. They may last a few seconds to a quarter of an hour. Muscle cramps are often more forceful and last longer than muscle spasms.  Muscle spasms may or may not be painful. They may also last just a few seconds or much longer. CAUSES  It is uncommon for cramps or spasms to be due to a serious underlying problem. In many cases, the cause of cramps or spasms is unknown. Some common causes are:  Overexertion.  Overuse from repetitive motions (doing the same thing over and over).  Remaining in a certain position for a long period of time.  Improper preparation, form, or technique while performing a sport or activity.  Dehydration.  Injury.  Side effects of some medicines.  Abnormally low levels of the salts and ions in your blood (electrolytes), especially potassium and calcium. This could happen if you are taking water pills (diuretics) or you are pregnant.  Some underlying medical problems can make it more likely to develop cramps or spasms. These include, but are not limited to:  Diabetes.  Parkinson disease.  Hormone disorders, such as thyroid problems.  Alcohol abuse.  Diseases specific to muscles, joints, and bones.  Blood vessel disease where not enough blood is getting to the muscles.  HOME CARE INSTRUCTIONS  Stay well hydrated. Drink enough water and fluids to keep your urine clear or pale yellow.  It may be helpful to massage, stretch, and relax the affected muscle.  For tight or tense muscles, use a warm towel, heating pad, or hot shower water directed to the  affected area.  If you are sore or have pain after a cramp or spasm, applying ice to the affected area may relieve discomfort.  Put ice in a plastic bag.  Place a towel between your skin and the bag.  Leave the ice on for 15-20 minutes, 03-04 times a day. Medicines used to treat a known cause of cramps or spasms may help reduce their frequency or severity. Only take over-the-counter or prescription medicines as directed by your caregiver. SEEK MEDICAL CARE IF:  Your cramps or spasms get more severe, more frequent, or do not improve over time.  MAKE SURE YOU:  Understand these instructions.  Will watch your condition.  Will get help right away if you are not doing well or get worse. This information is not intended to replace advice given to you by your health care provider. Make sure you discuss any questions you have with your health care provider.  Document Released: 02/27/2002 Document Revised: 01/02/2013 Document Reviewed: 08/24/2012  Elsevier Interactive Patient Education 2016 Elsevier Inc[employer]   Musculoskeletal Pain Musculoskeletal pain is muscle and boney aches and pains. These pains can occur in any part of the body. Your caregiver may treat you without knowing the cause of the pain. They may treat you if blood or urine tests, X-rays, and other tests were normal.  CAUSES There is often not a definite cause or reason for these pains. These pains may be caused by a type of germ (virus). The discomfort may also come from overuse.  Overuse includes working out too hard when your body is not fit. Boney aches also come from weather changes. Bone is sensitive to atmospheric pressure changes. HOME CARE INSTRUCTIONS   Ask when your test results will be ready. Make sure you get your test results.  Only take over-the-counter or prescription medicines for pain, discomfort, or fever as directed by your caregiver. If you were given medications for your condition, do not drive, operate machinery or  power tools, or sign legal documents for 24 hours. Do not drink alcohol. Do not take sleeping pills or other medications that may interfere with treatment.  Continue all activities unless the activities cause more pain. When the pain lessens, slowly resume normal activities. Gradually increase the intensity and duration of the activities or exercise.  During periods of severe pain, bed rest may be helpful. Lay or sit in any position that is comfortable.  Putting ice on the injured area.  Put ice in a bag.  Place a towel between your skin and the bag.  Leave the ice on for 15 to 20 minutes, 3 to 4 times a day.  Follow up with your caregiver for continued problems and no reason can be found for the pain. If the pain becomes worse or does not go away, it may be necessary to repeat tests or do additional testing. Your caregiver may need to look further for a possible cause. SEEK IMMEDIATE MEDICAL CARE IF:  You have pain that is getting worse and is not relieved by medications.  You develop chest pain that is associated with shortness or breath, sweating, feeling sick to your stomach (nauseous), or throw up (vomit).  Your pain becomes localized to the abdomen.  You develop any new symptoms that seem different or that concern you. MAKE SURE YOU:   Understand these instructions.  Will watch your condition.  Will get help right away if you are not doing well or get worse.   This information is not intended to replace advice given to you by your health care provider. Make sure you discuss any questions you have with your health care provider.   Document Released: 09/07/2005 Document Revised: 11/30/2011 Document Reviewed: 05/12/2013 Elsevier Interactive Patient Education [employer].

## 2015-12-09 NOTE — ED Notes (Signed)
Pt state states last week she had sore throat, nausea and bodyaches

## 2015-12-09 NOTE — ED Notes (Signed)
Triaged by Mitzi Davenport, under [employer]

## 2016-02-18 DIAGNOSIS — F418 Other specified anxiety disorders: Secondary | ICD-10-CM | POA: Insufficient documentation

## 2016-02-18 DIAGNOSIS — M25551 Pain in right hip: Secondary | ICD-10-CM

## 2016-02-18 DIAGNOSIS — M25561 Pain in right knee: Secondary | ICD-10-CM

## 2016-02-18 DIAGNOSIS — L819 Disorder of pigmentation, unspecified: Secondary | ICD-10-CM | POA: Insufficient documentation

## 2016-02-18 DIAGNOSIS — M25559 Pain in unspecified hip: Secondary | ICD-10-CM | POA: Insufficient documentation

## 2016-02-18 DIAGNOSIS — G8929 Other chronic pain: Secondary | ICD-10-CM | POA: Insufficient documentation

## 2016-04-14 ENCOUNTER — Encounter: Payer: Self-pay | Admitting: Obstetrics and Gynecology

## 2016-05-04 ENCOUNTER — Other Ambulatory Visit: Payer: Self-pay | Admitting: *Deleted

## 2016-05-04 MED ORDER — FLUOXETINE HCL 40 MG PO CAPS: 40.0000 mg | ORAL_CAPSULE | Freq: Every day | ORAL | 3 refills | Status: DC

## 2016-06-18 ENCOUNTER — Ambulatory Visit (INDEPENDENT_AMBULATORY_CARE_PROVIDER_SITE_OTHER): Admitting: Obstetrics and Gynecology

## 2016-06-18 ENCOUNTER — Other Ambulatory Visit: Payer: Self-pay | Admitting: Obstetrics and Gynecology

## 2016-06-18 ENCOUNTER — Encounter: Payer: Self-pay | Admitting: Obstetrics and Gynecology

## 2016-06-18 VITALS — BP 104/71 | HR 77 | Ht 63.0 in | Wt 136.2 lb

## 2016-06-18 DIAGNOSIS — Z01419 Encounter for gynecological examination (general) (routine) without abnormal findings: Secondary | ICD-10-CM

## 2016-06-18 DIAGNOSIS — N946 Dysmenorrhea, unspecified: Secondary | ICD-10-CM

## 2016-06-18 MED ORDER — CLONAZEPAM 0.5 MG PO TABS: 0.5000 mg | ORAL_TABLET | Freq: Two times a day (BID) | ORAL | 0 refills | Status: DC | PRN

## 2016-06-18 MED ORDER — TRANEXAMIC ACID 650 MG PO TABS: 1300.0000 mg | ORAL_TABLET | Freq: Three times a day (TID) | ORAL | 2 refills | Status: DC

## 2016-06-18 NOTE — Patient Instructions (Signed)
Preventive Care for Adults, Female A healthy lifestyle and preventive care can promote health and wellness. Preventive health guidelines for women include the following key practices.  A routine yearly physical is a good way to check with your health care provider about your health and preventive screening. It is a chance to share any concerns and updates on your health and to receive a thorough exam.  Visit your dentist for a routine exam and preventive care every 6 months. Brush your teeth twice a day and floss once a day. Good oral hygiene prevents tooth decay and gum disease.  The frequency of eye exams is based on your age, health, family medical history, use of contact lenses, and other factors. Follow your health care provider's recommendations for frequency of eye exams.  Eat a healthy diet. Foods like vegetables, fruits, whole grains, low-fat dairy products, and lean protein foods contain the nutrients you need without too many calories. Decrease your intake of foods high in solid fats, added sugars, and salt. Eat the right amount of calories for you.Get information about a proper diet from your health care provider, if necessary.  Regular physical exercise is one of the most important things you can do for your health. Most adults should get at least 150 minutes of moderate-intensity exercise (any activity that increases your heart rate and causes you to sweat) each week. In addition, most adults need muscle-strengthening exercises on 2 or more days a week.  Maintain a healthy weight. The body mass index (BMI) is a screening tool to identify possible weight problems. It provides an estimate of body fat based on height and weight. Your health care provider can find your BMI and can help you achieve or maintain a healthy weight.For adults 20 years and older:  A BMI below 18.5 is considered underweight.  A BMI of 18.5 to 24.9 is normal.  A BMI of 25 to 29.9 is considered  overweight.  A BMI of 30 and above is considered obese.  Maintain normal blood lipids and cholesterol levels by exercising and minimizing your intake of saturated fat. Eat a balanced diet with plenty of fruit and vegetables. Blood tests for lipids and cholesterol should begin at age 64 and be repeated every 5 years. If your lipid or cholesterol levels are high, you are over 50, or you are at high risk for heart disease, you may need your cholesterol levels checked more frequently.Ongoing high lipid and cholesterol levels should be treated with medicines if diet and exercise are not working.  If you smoke, find out from your health care provider how to quit. If you do not use tobacco, do not start.  Lung cancer screening is recommended for adults aged 52-80 years who are at high risk for developing lung cancer because of a history of smoking. A yearly low-dose CT scan of the lungs is recommended for people who have at least a 30-pack-year history of smoking and are a current smoker or have quit within the past 15 years. A pack year of smoking is smoking an average of 1 pack of cigarettes a day for 1 year (for example: 1 pack a day for 30 years or 2 packs a day for 15 years). Yearly screening should continue until the smoker has stopped smoking for at least 15 years. Yearly screening should be stopped for people who develop a health problem that would prevent them from having lung cancer treatment.  If you are pregnant, do not drink alcohol. If you are  breastfeeding, be very cautious about drinking alcohol. If you are not pregnant and choose to drink alcohol, do not have more than 1 drink per day. One drink is considered to be 12 ounces (355 mL) of beer, 5 ounces (148 mL) of wine, or 1.5 ounces (44 mL) of liquor.  Avoid use of street drugs. Do not share needles with anyone. Ask for help if you need support or instructions about stopping the use of drugs.  High blood pressure causes heart disease and  increases the risk of stroke. Your blood pressure should be checked at least every 1 to 2 years. Ongoing high blood pressure should be treated with medicines if weight loss and exercise do not work.  If you are 25-78 years old, ask your health care provider if you should take aspirin to prevent strokes.  Diabetes screening is done by taking a blood sample to check your blood glucose level after you have not eaten for a certain period of time (fasting). If you are not overweight and you do not have risk factors for diabetes, you should be screened once every 3 years starting at age 86. If you are overweight or obese and you are 3-87 years of age, you should be screened for diabetes every year as part of your cardiovascular risk assessment.  Breast cancer screening is essential preventive care for women. You should practice "breast self-awareness." This means understanding the normal appearance and feel of your breasts and may include breast self-examination. Any changes detected, no matter how small, should be reported to a health care provider. Women in their 66s and 30s should have a clinical breast exam (CBE) by a health care provider as part of a regular health exam every 1 to 3 years. After age 43, women should have a CBE every year. Starting at age 37, women should consider having a mammogram (breast X-ray test) every year. Women who have a family history of breast cancer should talk to their health care provider about genetic screening. Women at a high risk of breast cancer should talk to their health care providers about having an MRI and a mammogram every year.  Breast cancer gene (BRCA)-related cancer risk assessment is recommended for women who have family members with BRCA-related cancers. BRCA-related cancers include breast, ovarian, tubal, and peritoneal cancers. Having family members with these cancers may be associated with an increased risk for harmful changes (mutations) in the breast  cancer genes BRCA1 and BRCA2. Results of the assessment will determine the need for genetic counseling and BRCA1 and BRCA2 testing.  Your health care provider may recommend that you be screened regularly for cancer of the pelvic organs (ovaries, uterus, and vagina). This screening involves a pelvic examination, including checking for microscopic changes to the surface of your cervix (Pap test). You may be encouraged to have this screening done every 3 years, beginning at age 78.  For women ages 79-65, health care providers may recommend pelvic exams and Pap testing every 3 years, or they may recommend the Pap and pelvic exam, combined with testing for human papilloma virus (HPV), every 5 years. Some types of HPV increase your risk of cervical cancer. Testing for HPV may also be done on women of any age with unclear Pap test results.  Other health care providers may not recommend any screening for nonpregnant women who are considered low risk for pelvic cancer and who do not have symptoms. Ask your health care provider if a screening pelvic exam is right for  you.  If you have had past treatment for cervical cancer or a condition that could lead to cancer, you need Pap tests and screening for cancer for at least 20 years after your treatment. If Pap tests have been discontinued, your risk factors (such as having a new sexual partner) need to be reassessed to determine if screening should resume. Some women have medical problems that increase the chance of getting cervical cancer. In these cases, your health care provider may recommend more frequent screening and Pap tests.  Colorectal cancer can be detected and often prevented. Most routine colorectal cancer screening begins at the age of 50 years and continues through age 75 years. However, your health care provider may recommend screening at an earlier age if you have risk factors for colon cancer. On a yearly basis, your health care provider may provide  home test kits to check for hidden blood in the stool. Use of a small camera at the end of a tube, to directly examine the colon (sigmoidoscopy or colonoscopy), can detect the earliest forms of colorectal cancer. Talk to your health care provider about this at age 50, when routine screening begins. Direct exam of the colon should be repeated every 5-10 years through age 75 years, unless early forms of precancerous polyps or small growths are found.  People who are at an increased risk for hepatitis B should be screened for this virus. You are considered at high risk for hepatitis B if:  You were born in a country where hepatitis B occurs often. Talk with your health care provider about which countries are considered high risk.  Your parents were born in a high-risk country and you have not received a shot to protect against hepatitis B (hepatitis B vaccine).  You have HIV or AIDS.  You use needles to inject street drugs.  You live with, or have sex with, someone who has hepatitis B.  You get hemodialysis treatment.  You take certain medicines for conditions like cancer, organ transplantation, and autoimmune conditions.  Hepatitis C blood testing is recommended for all people born from 1945 through 1965 and any individual with known risks for hepatitis C.  Practice safe sex. Use condoms and avoid high-risk sexual practices to reduce the spread of sexually transmitted infections (STIs). STIs include gonorrhea, chlamydia, syphilis, trichomonas, herpes, HPV, and human immunodeficiency virus (HIV). Herpes, HIV, and HPV are viral illnesses that have no cure. They can result in disability, cancer, and death.  You should be screened for sexually transmitted illnesses (STIs) including gonorrhea and chlamydia if:  You are sexually active and are younger than 24 years.  You are older than 24 years and your health care provider tells you that you are at risk for this type of infection.  Your sexual  activity has changed since you were last screened and you are at an increased risk for chlamydia or gonorrhea. Ask your health care provider if you are at risk.  If you are at risk of being infected with HIV, it is recommended that you take a prescription medicine daily to prevent HIV infection. This is called preexposure prophylaxis (PrEP). You are considered at risk if:  You are sexually active and do not regularly use condoms or know the HIV status of your partner(s).  You take drugs by injection.  You are sexually active with a partner who has HIV.  Talk with your health care provider about whether you are at high risk of being infected with HIV. If   you choose to begin PrEP, you should first be tested for HIV. You should then be tested every 3 months for as long as you are taking PrEP.  Osteoporosis is a disease in which the bones lose minerals and strength with aging. This can result in serious bone fractures or breaks. The risk of osteoporosis can be identified using a bone density scan. Women ages 1 years and over and women at risk for fractures or osteoporosis should discuss screening with their health care providers. Ask your health care provider whether you should take a calcium supplement or vitamin D to reduce the rate of osteoporosis.  Menopause can be associated with physical symptoms and risks. Hormone replacement therapy is available to decrease symptoms and risks. You should talk to your health care provider about whether hormone replacement therapy is right for you.  Use sunscreen. Apply sunscreen liberally and repeatedly throughout the day. You should seek shade when your shadow is shorter than you. Protect yourself by wearing long sleeves, pants, a wide-brimmed hat, and sunglasses year round, whenever you are outdoors.  Once a month, do a whole body skin exam, using a mirror to look at the skin on your back. Tell your health care provider of new moles, moles that have irregular  borders, moles that are larger than a pencil eraser, or moles that have changed in shape or color.  Stay current with required vaccines (immunizations).  Influenza vaccine. All adults should be immunized every year.  Tetanus, diphtheria, and acellular pertussis (Td, Tdap) vaccine. Pregnant women should receive 1 dose of Tdap vaccine during each pregnancy. The dose should be obtained regardless of the length of time since the last dose. Immunization is preferred during the 27th-36th week of gestation. An adult who has not previously received Tdap or who does not know her vaccine status should receive 1 dose of Tdap. This initial dose should be followed by tetanus and diphtheria toxoids (Td) booster doses every 10 years. Adults with an unknown or incomplete history of completing a 3-dose immunization series with Td-containing vaccines should begin or complete a primary immunization series including a Tdap dose. Adults should receive a Td booster every 10 years.  Varicella vaccine. An adult without evidence of immunity to varicella should receive 2 doses or a second dose if she has previously received 1 dose. Pregnant females who do not have evidence of immunity should receive the first dose after pregnancy. This first dose should be obtained before leaving the health care facility. The second dose should be obtained 4-8 weeks after the first dose.  Human papillomavirus (HPV) vaccine. Females aged 13-26 years who have not received the vaccine previously should obtain the 3-dose series. The vaccine is not recommended for use in pregnant females. However, pregnancy testing is not needed before receiving a dose. If a female is found to be pregnant after receiving a dose, no treatment is needed. In that case, the remaining doses should be delayed until after the pregnancy. Immunization is recommended for any person with an immunocompromised condition through the age of 24 years if she did not get any or all doses  earlier. During the 3-dose series, the second dose should be obtained 4-8 weeks after the first dose. The third dose should be obtained 24 weeks after the first dose and 16 weeks after the second dose.  Zoster vaccine. One dose is recommended for adults aged 97 years or older unless certain conditions are present.  Measles, mumps, and rubella (MMR) vaccine. Adults born  before 1957 generally are considered immune to measles and mumps. Adults born in [birth year] or later should have 1 or more doses of MMR vaccine unless there is a contraindication to the vaccine or there is laboratory evidence of immunity to each of the three diseases. A routine second dose of MMR vaccine should be obtained at least 28 days after the first dose for students attending postsecondary schools, health care workers, or international travelers. People who received inactivated measles vaccine or an unknown type of measles vaccine during 1963-1967 should receive 2 doses of MMR vaccine. People who received inactivated mumps vaccine or an unknown type of mumps vaccine before 1979 and are at high risk for mumps infection should consider immunization with 2 doses of MMR vaccine. For females of childbearing age, rubella immunity should be determined. If there is no evidence of immunity, females who are not pregnant should be vaccinated. If there is no evidence of immunity, females who are pregnant should delay immunization until after pregnancy. Unvaccinated health care workers born before 60 who lack laboratory evidence of measles, mumps, or rubella immunity or laboratory confirmation of disease should consider measles and mumps immunization with 2 doses of MMR vaccine or rubella immunization with 1 dose of MMR vaccine.  Pneumococcal 13-valent conjugate (PCV13) vaccine. When indicated, a person who is uncertain of his immunization history and has no record of immunization should receive the PCV13 vaccine. All adults 61 years of age and older  should receive this vaccine. An adult aged 92 years or older who has certain medical conditions and has not been previously immunized should receive 1 dose of PCV13 vaccine. This PCV13 should be followed with a dose of pneumococcal polysaccharide (PPSV23) vaccine. Adults who are at high risk for pneumococcal disease should obtain the PPSV23 vaccine at least 8 weeks after the dose of PCV13 vaccine. Adults older than 27 years of age who have normal immune system function should obtain the PPSV23 vaccine dose at least 1 year after the dose of PCV13 vaccine.  Pneumococcal polysaccharide (PPSV23) vaccine. When PCV13 is also indicated, PCV13 should be obtained first. All adults aged 2 years and older should be immunized. An adult younger than age 30 years who has certain medical conditions should be immunized. Any person who resides in a nursing home or long-term care facility should be immunized. An adult smoker should be immunized. People with an immunocompromised condition and certain other conditions should receive both PCV13 and PPSV23 vaccines. People with human immunodeficiency virus (HIV) infection should be immunized as soon as possible after diagnosis. Immunization during chemotherapy or radiation therapy should be avoided. Routine use of PPSV23 vaccine is not recommended for American Indians, [place] Natives, or people younger than 65 years unless there are medical conditions that require PPSV23 vaccine. When indicated, people who have unknown immunization and have no record of immunization should receive PPSV23 vaccine. One-time revaccination 5 years after the first dose of PPSV23 is recommended for people aged 19-64 years who have chronic kidney failure, nephrotic syndrome, asplenia, or immunocompromised conditions. People who received 1-2 doses of PPSV23 before age 44 years should receive another dose of PPSV23 vaccine at age 83 years or later if at least 5 years have passed since the previous dose. Doses  of PPSV23 are not needed for people immunized with PPSV23 at or after age 20 years.  Meningococcal vaccine. Adults with asplenia or persistent complement component deficiencies should receive 2 doses of quadrivalent meningococcal conjugate (MenACWY-D) vaccine. The doses should be obtained  at least 2 months apart. Microbiologists working with certain meningococcal bacteria, [place] recruits, people at risk during an outbreak, and people who travel to or live in countries with a high rate of meningitis should be immunized. A first-year college student up through age 28 years who is living in a residence hall should receive a dose if she did not receive a dose on or after her 16th birthday. Adults who have certain high-risk conditions should receive one or more doses of vaccine.  Hepatitis A vaccine. Adults who wish to be protected from this disease, have certain high-risk conditions, work with hepatitis A-infected animals, work in hepatitis A research labs, or travel to or work in countries with a high rate of hepatitis A should be immunized. Adults who were previously unvaccinated and who anticipate close contact with an international adoptee during the first 60 days after arrival in the [place] from a country with a high rate of hepatitis A should be immunized.  Hepatitis B vaccine. Adults who wish to be protected from this disease, have certain high-risk conditions, may be exposed to blood or other infectious body fluids, are household contacts or sex partners of hepatitis B positive people, are clients or workers in certain care facilities, or travel to or work in countries with a high rate of hepatitis B should be immunized.  Haemophilus influenzae type b (Hib) vaccine. A previously unvaccinated person with asplenia or sickle cell disease or having a scheduled splenectomy should receive 1 dose of Hib vaccine. Regardless of previous immunization, a recipient of a hematopoietic stem cell transplant  should receive a 3-dose series 6-12 months after her successful transplant. Hib vaccine is not recommended for adults with HIV infection. Preventive Services / Frequency Ages 71 to 87 years  Blood pressure check.** / Every 3-5 years.  Lipid and cholesterol check.** / Every 5 years beginning at age 1.  Clinical breast exam.** / Every 3 years for women in their 3s and 31s.  BRCA-related cancer risk assessment.** / For women who have family members with a BRCA-related cancer (breast, ovarian, tubal, or peritoneal cancers).  Pap test.** / Every 2 years from ages 50 through 86. Every 3 years starting at age 87 through age 7 or 75 with a history of 3 consecutive normal Pap tests.  HPV screening.** / Every 3 years from ages 59 through ages 35 to 6 with a history of 3 consecutive normal Pap tests.  Hepatitis C blood test.** / For any individual with known risks for hepatitis C.  Skin self-exam. / Monthly.  Influenza vaccine. / Every year.  Tetanus, diphtheria, and acellular pertussis (Tdap, Td) vaccine.** / Consult your health care provider. Pregnant women should receive 1 dose of Tdap vaccine during each pregnancy. 1 dose of Td every 10 years.  Varicella vaccine.** / Consult your health care provider. Pregnant females who do not have evidence of immunity should receive the first dose after pregnancy.  HPV vaccine. / 3 doses over 6 months, if 72 and younger. The vaccine is not recommended for use in pregnant females. However, pregnancy testing is not needed before receiving a dose.  Measles, mumps, rubella (MMR) vaccine.** / You need at least 1 dose of MMR if you were born in 1957 or later. You may also need a 2nd dose. For females of childbearing age, rubella immunity should be determined. If there is no evidence of immunity, females who are not pregnant should be vaccinated. If there is no evidence of immunity, females who are  pregnant should delay immunization until after  pregnancy.  Pneumococcal 13-valent conjugate (PCV13) vaccine.** / Consult your health care provider.  Pneumococcal polysaccharide (PPSV23) vaccine.** / 1 to 2 doses if you smoke cigarettes or if you have certain conditions.  Meningococcal vaccine.** / 1 dose if you are age 87 to 44 years and a Market researcher living in a residence hall, or have one of several medical conditions, you need to get vaccinated against meningococcal disease. You may also need additional booster doses.  Hepatitis A vaccine.** / Consult your health care provider.  Hepatitis B vaccine.** / Consult your health care provider.  Haemophilus influenzae type b (Hib) vaccine.** / Consult your health care provider. Ages 86 to 38 years  Blood pressure check.** / Every year.  Lipid and cholesterol check.** / Every 5 years beginning at age 49 years.  Lung cancer screening. / Every year if you are aged 71-80 years and have a 30-pack-year history of smoking and currently smoke or have quit within the past 15 years. Yearly screening is stopped once you have quit smoking for at least 15 years or develop a health problem that would prevent you from having lung cancer treatment.  Clinical breast exam.** / Every year after age 51 years.  BRCA-related cancer risk assessment.** / For women who have family members with a BRCA-related cancer (breast, ovarian, tubal, or peritoneal cancers).  Mammogram.** / Every year beginning at age 18 years and continuing for as long as you are in good health. Consult with your health care provider.  Pap test.** / Every 3 years starting at age 63 years through age 37 or 57 years with a history of 3 consecutive normal Pap tests.  HPV screening.** / Every 3 years from ages 41 years through ages 76 to 23 years with a history of 3 consecutive normal Pap tests.  Fecal occult blood test (FOBT) of stool. / Every year beginning at age 36 years and continuing until age 51 years. You may not need  to do this test if you get a colonoscopy every 10 years.  Flexible sigmoidoscopy or colonoscopy.** / Every 5 years for a flexible sigmoidoscopy or every 10 years for a colonoscopy beginning at age 36 years and continuing until age 35 years.  Hepatitis C blood test.** / For all people born from [birth year] through 1965 and any individual with known risks for hepatitis C.  Skin self-exam. / Monthly.  Influenza vaccine. / Every year.  Tetanus, diphtheria, and acellular pertussis (Tdap/Td) vaccine.** / Consult your health care provider. Pregnant women should receive 1 dose of Tdap vaccine during each pregnancy. 1 dose of Td every 10 years.  Varicella vaccine.** / Consult your health care provider. Pregnant females who do not have evidence of immunity should receive the first dose after pregnancy.  Zoster vaccine.** / 1 dose for adults aged 73 years or older.  Measles, mumps, rubella (MMR) vaccine.** / You need at least 1 dose of MMR if you were born in 1957 or later. You may also need a second dose. For females of childbearing age, rubella immunity should be determined. If there is no evidence of immunity, females who are not pregnant should be vaccinated. If there is no evidence of immunity, females who are pregnant should delay immunization until after pregnancy.  Pneumococcal 13-valent conjugate (PCV13) vaccine.** / Consult your health care provider.  Pneumococcal polysaccharide (PPSV23) vaccine.** / 1 to 2 doses if you smoke cigarettes or if you have certain conditions.  Meningococcal vaccine.** /  Consult your health care provider.  Hepatitis A vaccine.** / Consult your health care provider.  Hepatitis B vaccine.** / Consult your health care provider.  Haemophilus influenzae type b (Hib) vaccine.** / Consult your health care provider. Ages 80 years and over  Blood pressure check.** / Every year.  Lipid and cholesterol check.** / Every 5 years beginning at age 62 years.  Lung cancer  screening. / Every year if you are aged 32-80 years and have a 30-pack-year history of smoking and currently smoke or have quit within the past 15 years. Yearly screening is stopped once you have quit smoking for at least 15 years or develop a health problem that would prevent you from having lung cancer treatment.  Clinical breast exam.** / Every year after age 61 years.  BRCA-related cancer risk assessment.** / For women who have family members with a BRCA-related cancer (breast, ovarian, tubal, or peritoneal cancers).  Mammogram.** / Every year beginning at age 39 years and continuing for as long as you are in good health. Consult with your health care provider.  Pap test.** / Every 3 years starting at age 85 years through age 74 or 72 years with 3 consecutive normal Pap tests. Testing can be stopped between 65 and 70 years with 3 consecutive normal Pap tests and no abnormal Pap or HPV tests in the past 10 years.  HPV screening.** / Every 3 years from ages 55 years through ages 67 or 77 years with a history of 3 consecutive normal Pap tests. Testing can be stopped between 65 and 70 years with 3 consecutive normal Pap tests and no abnormal Pap or HPV tests in the past 10 years.  Fecal occult blood test (FOBT) of stool. / Every year beginning at age 81 years and continuing until age 22 years. You may not need to do this test if you get a colonoscopy every 10 years.  Flexible sigmoidoscopy or colonoscopy.** / Every 5 years for a flexible sigmoidoscopy or every 10 years for a colonoscopy beginning at age 67 years and continuing until age 22 years.  Hepatitis C blood test.** / For all people born from [birth year] through 1967 and any individual with known risks for hepatitis C.  Osteoporosis screening.** / A one-time screening for women ages 8 years and over and women at risk for fractures or osteoporosis.  Skin self-exam. / Monthly.  Influenza vaccine. / Every year.  Tetanus, diphtheria, and  acellular pertussis (Tdap/Td) vaccine.** / 1 dose of Td every 10 years.  Varicella vaccine.** / Consult your health care provider.  Zoster vaccine.** / 1 dose for adults aged 56 years or older.  Pneumococcal 13-valent conjugate (PCV13) vaccine.** / Consult your health care provider.  Pneumococcal polysaccharide (PPSV23) vaccine.** / 1 dose for all adults aged 15 years and older.  Meningococcal vaccine.** / Consult your health care provider.  Hepatitis A vaccine.** / Consult your health care provider.  Hepatitis B vaccine.** / Consult your health care provider.  Haemophilus influenzae type b (Hib) vaccine.** / Consult your health care provider. ** Family history and personal history of risk and conditions may change your health care provider's recommendations.   This information is not intended to replace advice given to you by your health care provider. Make sure you discuss any questions you have with your health care provider.   Document Released: 11/03/2001 Document Revised: 09/28/2014 Document Reviewed: 02/02/2011 Elsevier Interactive Patient Education [employer].

## 2016-06-18 NOTE — Progress Notes (Signed)
  Subjective:     Stephanie Logan is a 25 y.o. female and is here for a comprehensive physical exam. The patient reports no problems except menses are getting heavier and more painful.  Social History   Social History  . Marital status: Married    Spouse name: N/A  . Number of children: N/A  . Years of education: N/A   Occupational History  . Not on file.   Social History Main Topics  . Smoking status: Never Smoker  . Smokeless tobacco: Never Used  . Alcohol use Yes     Comment: occas  . Drug use: No  . Sexual activity: Yes    Birth control/ protection: None   Other Topics Concern  . Not on file   Social History Narrative  . No narrative on file   Health Maintenance  Topic Date Due  . HIV Screening  11/18/2003  . TETANUS/TDAP  11/18/2007  . INFLUENZA VACCINE  04/21/2016  . PAP SMEAR  02/12/2018    The following portions of the patient's history were reviewed and updated as appropriate: allergies, current medications, past family history, past medical history, past social history, past surgical history and problem list.  Review of Systems Pertinent items noted in HPI and remainder of comprehensive ROS otherwise negative.   Objective:    General appearance: alert, cooperative and appears stated age Neck: no adenopathy, no carotid bruit, no JVD, supple, symmetrical, trachea midline and thyroid not enlarged, symmetric, no tenderness/mass/nodules Lungs: clear to auscultation bilaterally Breasts: normal appearance, no masses or tenderness Heart: regular rate and rhythm, S1, S2 normal, no murmur, click, rub or gallop Abdomen: soft, non-tender; bowel sounds normal; no masses,  no organomegaly Pelvic: cervix normal in appearance, external genitalia normal, no adnexal masses or tenderness, no cervical motion tenderness, rectovaginal septum normal, uterus normal size, shape, and consistency and vagina normal without discharge    Assessment:    Healthy female exam.  Dysmenorrhea with irregular menses.     Plan:  [place]-pap obtained;  Discussed possible restart of metformin if desires another pregnancy. RTC 1 year   See After Visit Summary for Counseling Recommendations

## 2016-06-19 LAB — CYTOLOGY - PAP

## 2016-07-03 ENCOUNTER — Ambulatory Visit (INDEPENDENT_AMBULATORY_CARE_PROVIDER_SITE_OTHER)

## 2016-07-03 ENCOUNTER — Ambulatory Visit (INDEPENDENT_AMBULATORY_CARE_PROVIDER_SITE_OTHER): Admitting: Obstetrics and Gynecology

## 2016-07-03 ENCOUNTER — Encounter: Payer: Self-pay | Admitting: Obstetrics and Gynecology

## 2016-07-03 VITALS — BP 108/67 | HR 83 | Ht 63.0 in | Wt 140.3 lb

## 2016-07-03 DIAGNOSIS — N926 Irregular menstruation, unspecified: Secondary | ICD-10-CM

## 2016-07-03 LAB — POCT URINE PREGNANCY: PREG TEST UR: POSITIVE — AB

## 2016-07-03 NOTE — Progress Notes (Signed)
Subjective:     Patient ID: PEARLE GABEL, female   DOB: 14-Sep-1991, 25 y.o.   MRN: [id number]  HPI Previously irregular menses with spotting occasionally and had + UPT at home last night, feels slightly nauseated and tired, otherwise OK. Had scant spotting in august and September but not sure when.  Review of Systems Negative except stated in HPI    Objective:   Physical Exam   A&Ox4 Well groomed female in no distress Blood pressure 108/67, pulse 83, height 5\' 3"  (1.6 m), weight 140 lb 4.8 oz (63.6 kg), last menstrual period 04/22/2016. UPT+ Ultrasound today reveals: Indications:Unsure LMP Findings:  Unsure LMP. No IUP visualized. Endometrium measures 9.91mm.  Right Ovary measures 3 x 2.2 x 2.4 cm. It is normal in appearance. Left Ovary measures 4.4 x 1.8 x 2.6 cm. It is normal appearance. There is evidence of a corpus luteal cyst in the Left Survey of the adnexa demonstrates no adnexal masses. There is no free peritoneal fluid in the cul de sac.    Assessment:     Missed menses H/o early miscarriage    Plan:     Quant Bhcg obtained, will return for f/u u/s as indicated.  Melody Eden, CNM

## 2016-07-03 NOTE — Patient Instructions (Signed)
Thank you for enrolling in [facility]. Please follow the instructions below to securely access your online medical record. MyChart allows you to send messages to your doctor, view your test results, renew your prescriptions, schedule appointments, and more.  How Do I Sign Up? 1. In your Internet browser, go to [email or web address]. 2. Click on the New  User? link in the Sign In box.  3. Enter your MyChart Access Code exactly as it appears below. You will not need to use this code after you have completed the sign-up process. If you do not sign up before the expiration date, you must request a new code. MyChart Access Code: [email or web address] Expires: 08/17/2016  9:50 AM  4. Enter the last four digits of your Social Security Number (xxxx) and Date of Birth (mm/dd/yyyy) as indicated and click Next. You will be taken to the next sign-up page. 5. Create a MyChart ID. This will be your MyChart login ID and cannot be changed, so think of one that is secure and easy to remember. 6. Create a MyChart password. You can change your password at any time. 7. Enter your Password Reset Question and Answer and click Next. This can be used at a later time if you forget your password.  8. Select your communication preference, and if applicable enter your e-mail address. You will receive e-mail notification when new information is available in MyChart by choosing to receive e-mail notifications and filling in your e-mail. 9. Click Sign In. You can now view your medical record.   Additional Information If you have questions, you can email [email or web address] or call [phone] to talk to our [facility] staff. Remember, MyChart is NOT to be used for urgent needs. For medical emergencies, dial 911.

## 2016-07-04 ENCOUNTER — Encounter: Payer: Self-pay | Admitting: Obstetrics and Gynecology

## 2016-07-04 LAB — BETA HCG QUANT (REF LAB): hCG Quant: 47 m[IU]/mL

## 2016-07-09 ENCOUNTER — Other Ambulatory Visit: Payer: Self-pay | Admitting: *Deleted

## 2016-07-09 ENCOUNTER — Other Ambulatory Visit

## 2016-07-09 DIAGNOSIS — N926 Irregular menstruation, unspecified: Secondary | ICD-10-CM

## 2016-07-10 LAB — BETA HCG QUANT (REF LAB): HCG QUANT: 1616 m[IU]/mL

## 2016-07-13 ENCOUNTER — Other Ambulatory Visit: Payer: Self-pay | Admitting: Obstetrics and Gynecology

## 2016-07-13 DIAGNOSIS — O3680X0 Pregnancy with inconclusive fetal viability, not applicable or unspecified: Secondary | ICD-10-CM

## 2016-07-17 ENCOUNTER — Other Ambulatory Visit: Payer: Self-pay

## 2016-07-21 ENCOUNTER — Other Ambulatory Visit: Payer: Self-pay | Admitting: Obstetrics and Gynecology

## 2016-07-21 ENCOUNTER — Ambulatory Visit (INDEPENDENT_AMBULATORY_CARE_PROVIDER_SITE_OTHER)

## 2016-07-21 DIAGNOSIS — O3680X0 Pregnancy with inconclusive fetal viability, not applicable or unspecified: Secondary | ICD-10-CM

## 2016-08-03 ENCOUNTER — Ambulatory Visit (INDEPENDENT_AMBULATORY_CARE_PROVIDER_SITE_OTHER): Admitting: Obstetrics and Gynecology

## 2016-08-03 VITALS — BP 101/62 | HR 79 | Wt 140.0 lb

## 2016-08-03 DIAGNOSIS — Z369 Encounter for antenatal screening, unspecified: Secondary | ICD-10-CM

## 2016-08-03 DIAGNOSIS — Z3481 Encounter for supervision of other normal pregnancy, first trimester: Secondary | ICD-10-CM

## 2016-08-03 DIAGNOSIS — Z113 Encounter for screening for infections with a predominantly sexual mode of transmission: Secondary | ICD-10-CM

## 2016-08-03 DIAGNOSIS — Z1389 Encounter for screening for other disorder: Secondary | ICD-10-CM

## 2016-08-03 NOTE — Progress Notes (Signed)
Stephanie Logan presents for NOB nurse interview visit. Pregnancy confirmation done by [facility] on 06/1916 with Bhcg: 1,616 and ultrasound done for dating and viability on 07/21/16 resulting in a 6.3wk viable pregnancy. EDD: 03/13/2017. LMP: pt was unsure about.  G-4.  P-2012 . History of miscarriage. Pregnancy education material explained and given.  No cats in the home. NOB labs ordered.  HIV labs and Drug screen were explained optional and she did not decline. Drug screen ordered. PNV encouraged. Genetic screening, to discuss with provider. Pt considering either Panoroma or MaternIT Pt. To follow up with provider on 09/02/2016  NOB physical.  All questions answered.

## 2016-08-03 NOTE — Patient Instructions (Signed)
Hyperemesis Gravidarum Hyperemesis gravidarum is a severe form of nausea and vomiting that happens during pregnancy. Hyperemesis is worse than morning sickness. It may cause you to have nausea or vomiting all day for many days. It may keep you from eating and drinking enough food and liquids. Hyperemesis usually occurs during the first half (the first 20 weeks) of pregnancy. It often goes away once a woman is in her second half of pregnancy. However, sometimes hyperemesis continues through an entire pregnancy.  CAUSES  The cause of this condition is not completely known but is thought to be related to changes in the body's hormones when pregnant. It could be from the high level of the pregnancy hormone or an increase in estrogen in the body.  SIGNS AND SYMPTOMS   Severe nausea and vomiting.  Nausea that does not go away.  Vomiting that does not allow you to keep any food down.  Weight loss and body fluid loss (dehydration).  Having no desire to eat or not liking food you have previously enjoyed. DIAGNOSIS  Your health care provider will do a physical exam and ask you about your symptoms. He or she may also order blood tests and urine tests to make sure something else is not causing the problem.  TREATMENT  You may only need medicine to control the problem. If medicines do not control the nausea and vomiting, you will be treated in the hospital to prevent dehydration, increased acid in the blood (acidosis), weight loss, and changes in the electrolytes in your body that may harm the unborn baby (fetus). You may need IV fluids.  HOME CARE INSTRUCTIONS   Only take over-the-counter or prescription medicines as directed by your health care provider.  Try eating a couple of dry crackers or toast in the morning before getting out of bed.  Avoid foods and smells that upset your stomach.  Avoid fatty and spicy foods.  Eat 5-6 small meals a day.  Do not drink when eating meals. Drink between  meals.  For snacks, eat high-protein foods, such as cheese.  Eat or suck on things that have ginger in them. Ginger helps nausea.  Avoid food preparation. The smell of food can spoil your appetite.  Avoid iron pills and iron in your multivitamins until after 3-4 months of being pregnant. However, consult with your health care provider before stopping any prescribed iron pills. SEEK MEDICAL CARE IF:   Your abdominal pain increases.  You have a severe headache.  You have vision problems.  You are losing weight. SEEK IMMEDIATE MEDICAL CARE IF:   You are unable to keep fluids down.  You vomit blood.  You have constant nausea and vomiting.  You have excessive weakness.  You have extreme thirst.  You have dizziness or fainting.  You have a fever or persistent symptoms for more than 2-3 days.  You have a fever and your symptoms suddenly get worse. MAKE SURE YOU:  Understand these instructions. Pregnancy and Zika Virus Disease Zika virus disease, or Zika, is an illness that can spread to people from mosquitoes that carry the virus. It may also spread from person to person through infected body fluids. Zika first occurred in [place], but recently it has spread to new areas. The virus occurs in tropical climates. The location of Zika continues to change. Most people who become infected with Zika virus do not develop serious illness. However, Zika may cause birth defects in an unborn baby whose mother is infected with the virus.  It may also increase the risk of miscarriage. WHAT ARE THE SYMPTOMS OF ZIKA VIRUS DISEASE? In many cases, people who have been infected with Zika virus do not develop any symptoms. If symptoms appear, they usually start about a week after the person is infected. Symptoms are usually mild. They may include: Fever. Rash. Red eyes. Joint pain. HOW DOES ZIKA VIRUS DISEASE SPREAD? The main way that [place] virus spreads is through the bite of a certain type of  mosquito. Unlike most types of mosquitos, which bite only at night, the type of mosquito that carries Zika virus bites both at night and during the day. Zika virus can also spread through sexual contact, through a blood transfusion, and from a mother to her baby before or during birth. Once you have had Zika virus disease, it is unlikely that you will get it again. CAN I PASS ZIKA TO MY BABY DURING PREGNANCY? Yes, Zika can pass from a mother to her baby before or during birth. WHAT PROBLEMS CAN ZIKA CAUSE FOR MY BABY? A woman who is infected with Zika virus while pregnant is at risk of having her baby born with a condition in which the brain or head is smaller than expected (microcephaly). Babies who have microcephaly can have developmental delays, seizures, hearing problems, and vision problems. Having Zika virus disease during pregnancy can also increase the risk of miscarriage. HOW CAN ZIKA VIRUS DISEASE BE PREVENTED? There is no vaccine to prevent Zika. The best way to prevent the disease is to avoid infected mosquitoes and avoid exposure to body fluids that can spread the virus. Avoid any possible exposure to [place] by taking the following precautions. For women and their sex partners: Avoid traveling to high-risk areas. The locations where [place] is being reported change often. To identify high-risk areas, check the CDC travel website: CreditChaos.[employer].ee If you or your sex partner must travel to a high-risk area, talk with a health care provider before and after traveling. Take all precautions to avoid mosquito bites if you live in, or travel to, any of the high-risk areas. Insect repellents are safe to use during pregnancy. Ask your health care provider when it is safe to have sexual contact. For women: If you are pregnant or trying to become pregnant, avoid sexual contact with persons who may have been exposed to [place] virus, persons who have possible symptoms of Zika, or persons  whose history you are unsure about. If you choose to have sexual contact with someone who may have been exposed to [place] virus, use condoms correctly during the entire duration of sexual activity, every time. Do not share sexual devices, as you may be exposed to body fluids. Ask your health care provider about when it is safe to attempt pregnancy after a possible exposure to [place] virus. WHAT STEPS SHOULD I TAKE TO AVOID MOSQUITO BITES? Take these steps to avoid mosquito bites when you are in a high-risk area: Wear loose clothing that covers your arms and legs. Limit your outdoor activities. Do not open windows unless they have window screens. Sleep under mosquito nets. Use insect repellent. The best insect repellents have: DEET, picaridin, oil of lemon eucalyptus (OLE), or IR3535 in them. Higher amounts of an active ingredient in them. Remember that insect repellents are safe to use during pregnancy. Do not use OLE on children who are younger than 54 years of age. Do not use insect repellent on babies who are younger than 2 months of age. Cover your child's stroller with  mosquito netting. Make sure the netting fits snugly and that any loose netting does not cover your child's mouth or nose. Do not use a blanket as a mosquito-protection cover. Do not apply insect repellent underneath clothing. If you are using sunscreen, apply the sunscreen before applying the insect repellent. Treat clothing with permethrin. Do not apply permethrin directly to your skin. Follow label directions for safe use. Get rid of standing water, where mosquitoes may reproduce. Standing water is often found in items such as buckets, bowls, animal food dishes, and flowerpots. When you return from traveling to any high-risk area, continue taking actions to protect yourself against mosquito bites for 3 weeks, even if you show no signs of illness. This will prevent spreading Zika virus to uninfected mosquitoes. WHAT SHOULD I KNOW  ABOUT THE SEXUAL TRANSMISSION OF ZIKA? People can spread Zika to their sexual partners during vaginal, anal, or oral sex, or by sharing sexual devices. Many people with [place] do not develop symptoms, so a person could spread the disease without knowing that they are infected. The greatest risk is to women who are pregnant or who may become pregnant. Zika virus can live longer in semen than it can live in blood. Couples can prevent sexual transmission of the virus by: Using condoms correctly during the entire duration of sexual activity, every time. This includes vaginal, anal, and oral sex. Not sharing sexual devices. Sharing increases your risk of being exposed to body fluid from another person. Avoiding all sexual activity until your health care provider says it is safe. SHOULD I BE TESTED FOR ZIKA VIRUS? A sample of your blood can be tested for Zika virus. A pregnant woman should be tested if she may have been exposed to the virus or if she has symptoms of Zika. She may also have additional tests done during her pregnancy, such ultrasound testing. Talk with your health care provider about which tests are recommended.   This information is not intended to replace advice given to you by your health care provider. Make sure you discuss any questions you have with your health care provider.   Document Released: 05/29/2015 Document Reviewed: 05/22/2015 Elsevier Interactive Patient Education 2016 [place] and Medications in Pregnancy  Cold/Flu:  Sudafed for congestion- Robitussin (plain) for cough- Tylenol for discomfort.  Please follow the directions on the label.  Try not to take any more than needed.  OTC Saline nasal spray and air humidifier or cool-mist  Vaporizer to sooth nasal irritation and to loosen congestion.  It is also important to increase intake of non carbonated fluids, especially if you have a fever.  Constipation:  Colace-2 capsules at bedtime; Metamucil- follow  directions on label; Senokot- 1 tablet at bedtime.  Any one of these medications can be used.  It is also very important to increase fluids and fruits along with regular exercise.  If problem persists please call the office.  Diarrhea:  Kaopectate as directed on the label.  Eat a bland diet and increase fluids.  Avoid highly seasoned foods.  Headache:  Tylenol 1 or 2 tablets every 3-4 hours as needed  Indigestion:  Maalox, Mylanta, Tums or Rolaids- as directed on label.  Also try to eat small meals and avoid fatty, greasy or spicy foods.  Nausea with or without Vomiting:  Nausea in pregnancy is caused by increased levels of hormones in the body which influence the digestive system and cause irritation when stomach acids accumulate.  Symptoms usually subside after 1st  trimester of pregnancy.  Try the following: Keep saltines, graham crackers or dry toast by your bed to eat upon awakening. Don't let your stomach get empty.  Try to eat 5-6 small meals per day instead of 3 large ones. Avoid greasy fatty or highly seasoned foods.  Take OTC Unisom 1 tablet at bed time along with OTC Vitamin B6 25-50 mg 3 times per day.    If nausea continues with vomiting and you are unable to keep down food and fluids you may need a prescription medication.  Please notify your provider.   Sore throat:  Chloraseptic spray, throat lozenges and or plain Tylenol.  Vaginal Yeast Infection:  OTC Monistat for 7 days as directed on label.  If symptoms do not resolve within a week notify provider.  If any of the above problems do not subside with recommended treatment please call the office for further assistance.   Do not take Aspirin, Advil, Motrin or Ibuprofen.  * * OTC= Over the counter    Will watch your condition.  Will get help right away if you are not doing well or get worse.   This information is not intended to replace advice given to you by your health care provider. Make sure you discuss any questions you  have with your health care provider.   Document Released: 09/07/2005 Document Revised: 06/28/2013 Document Reviewed: 04/19/2013 Elsevier Interactive Patient Education 2016 [employer]. Commonly Asked Questions During Pregnancy  Cats: A parasite can be excreted in cat feces.  To avoid exposure you need to have another person empty the little box.  If you must empty the litter box you will need to wear gloves.  Wash your hands after handling your cat.  This parasite can also be found in raw or undercooked meat so this should also be avoided.  Colds, Sore Throats, Flu: Please check your medication sheet to see what you can take for symptoms.  If your symptoms are unrelieved by these medications please call the office.  Dental Work: Most any dental work Investment banker, corporate recommends is permitted.  X-rays should only be taken during the first trimester if absolutely necessary.  Your abdomen should be shielded with a lead apron during all x-rays.  Please notify your provider prior to receiving any x-rays.  Novocaine is fine; gas is not recommended.  If your dentist requires a note from [place] prior to dental work please call the office and we will provide one for you.  Exercise: Exercise is an important part of staying healthy during your pregnancy.  You may continue most exercises you were accustomed to prior to pregnancy.  Later in your pregnancy you will most likely notice you have difficulty with activities requiring balance like riding a bicycle.  It is important that you listen to your body and avoid activities that put you at a higher risk of falling.  Adequate rest and staying well hydrated are a must!  If you have questions about the safety of specific activities ask your provider.    Exposure to Children with illness: Try to avoid obvious exposure; report any symptoms to [place] when noted,  If you have chicken pos, red measles or mumps, you should be immune to these diseases.   Please do not take any vaccines  while pregnant unless you have checked with your OB provider.  Fetal Movement: After 28 weeks we recommend you do "kick counts" twice daily.  Lie or sit down in a calm quiet environment and count your  baby movements "kicks".  You should feel your baby at least 10 times per hour.  If you have not felt 10 kicks within the first hour get up, walk around and have something sweet to eat or drink then repeat for an additional hour.  If count remains less than 10 per hour notify your provider.  Fumigating: Follow your pest control agent's advice as to how long to stay out of your home.  Ventilate the area well before re-entering.  Hemorrhoids:   Most over-the-counter preparations can be used during pregnancy.  Check your medication to see what is safe to use.  It is important to use a stool softener or fiber in your diet and to drink lots of liquids.  If hemorrhoids seem to be getting worse please call the office.   Hot Tubs:  Hot tubs Jacuzzis and saunas are not recommended while pregnant.  These increase your internal body temperature and should be avoided.  Intercourse:  Sexual intercourse is safe during pregnancy as long as you are comfortable, unless otherwise advised by your provider.  Spotting may occur after intercourse; report any bright red bleeding that is heavier than spotting.  Labor:  If you know that you are in labor, please go to the hospital.  If you are unsure, please call the office and let us help you decide what to do.  Lifting, straining, etc:  If your job requires heavy lifting or straining please check with your provider for any limitations.  Generally, you should not lift items heavier than that you can lift simply with your hands and arms (no back muscles)  Painting:  Paint fumes do not harm your pregnancy, but may make you ill and should be avoided if possible.  Latex or water based paints have less odor than oils.  Use adequate ventilation while painting.  Permanents & Hair  Color:  Chemicals in hair dyes are not recommended as they cause increase hair dryness which can increase hair loss during pregnancy.  " Highlighting" and permanents are allowed.  Dye may be absorbed differently and permanents may not hold as well during pregnancy.  Sunbathing:  Use a sunscreen, as skin burns easily during pregnancy.  Drink plenty of fluids; avoid over heating.  Tanning Beds:  Because their possible side effects are still unknown, tanning beds are not recommended.  Ultrasound Scans:  Routine ultrasounds are performed at approximately 20 weeks.  You will be able to see your baby's general anatomy an if you would like to know the gender this can usually be determined as well.  If it is questionable when you conceived you may also receive an ultrasound early in your pregnancy for dating purposes.  Otherwise ultrasound exams are not routinely performed unless there is a medical necessity.  Although you can request a scan we ask that you pay for it when conducted because insurance does not cover " patient request" scans.  Work: If your pregnancy proceeds without complications you may work until your due date, unless your physician or employer advises otherwise.  Round Ligament Pain/Pelvic Discomfort:  Sharp, shooting pains not associated with bleeding are fairly common, usually occurring in the second trimester of pregnancy.  They tend to be worse when standing up or when you remain standing for long periods of time.  These are the result of pressure of certain pelvic ligaments called "round ligaments".  Rest, Tylenol and heat seem to be the most effective relief.  As the womb and fetus grow, they rise  out of the pelvis and the discomfort improves.  Please notify the office if your pain seems different than that described.  It may represent a more serious condition.

## 2016-08-04 LAB — CBC WITH DIFFERENTIAL/PLATELET
BASOS ABS: 0 10*3/uL (ref 0.0–0.2)
Basos: 0 %
EOS (ABSOLUTE): 0 10*3/uL (ref 0.0–0.4)
Eos: 0 %
HEMOGLOBIN: 12.6 g/dL (ref 11.1–15.9)
Hematocrit: 37.8 % (ref 34.0–46.6)
IMMATURE GRANULOCYTES: 0 %
Immature Grans (Abs): 0 10*3/uL (ref 0.0–0.1)
LYMPHS ABS: 1.5 10*3/uL (ref 0.7–3.1)
Lymphs: 18 %
MCH: 28.5 pg (ref 26.6–33.0)
MCHC: 33.3 g/dL (ref 31.5–35.7)
MCV: 86 fL (ref 79–97)
MONOCYTES: 6 %
Monocytes Absolute: 0.5 10*3/uL (ref 0.1–0.9)
NEUTROS PCT: 76 %
Neutrophils Absolute: 6.3 10*3/uL (ref 1.4–7.0)
Platelets: 277 10*3/uL (ref 150–379)
RBC: 4.42 x10E6/uL (ref 3.77–5.28)
RDW: 13.9 % (ref 12.3–15.4)
WBC: 8.4 10*3/uL (ref 3.4–10.8)

## 2016-08-04 LAB — ANTIBODY SCREEN: ANTIBODY SCREEN: NEGATIVE

## 2016-08-04 LAB — RH TYPE: Rh Factor: POSITIVE

## 2016-08-04 LAB — ABO

## 2016-08-04 LAB — VARICELLA ZOSTER ANTIBODY, IGG: VARICELLA: 360 {index} (ref >165)

## 2016-08-04 LAB — RUBELLA SCREEN: RUBELLA: 2.05 {index} (ref >0.99)

## 2016-08-04 LAB — HIV ANTIBODY (ROUTINE TESTING W REFLEX): HIV Screen 4th Generation wRfx: NONREACTIVE

## 2016-08-04 LAB — HEPATITIS B SURFACE ANTIGEN: HEP B S AG: NEGATIVE

## 2016-08-04 LAB — RPR: RPR Ser Ql: NONREACTIVE

## 2016-08-05 ENCOUNTER — Other Ambulatory Visit: Payer: Self-pay | Admitting: Obstetrics and Gynecology

## 2016-08-05 LAB — MONITOR DRUG PROFILE 14(MW)
Amphetamine Scrn, Ur: NEGATIVE ng/mL
BARBITURATE SCREEN URINE: NEGATIVE ng/mL
BENZODIAZEPINE SCREEN, URINE: NEGATIVE ng/mL
BUPRENORPHINE, URINE: NEGATIVE ng/mL
CANNABINOIDS UR QL SCN: NEGATIVE ng/mL
COCAINE(METAB.)SCREEN, URINE: NEGATIVE ng/mL
Creatinine(Crt), U: 145.5 mg/dL (ref 20.0–300.0)
FENTANYL, URINE: NEGATIVE pg/mL
Meperidine Screen, Urine: NEGATIVE ng/mL
Methadone Screen, Urine: NEGATIVE ng/mL
OPIATE SCREEN URINE: NEGATIVE ng/mL
OXYCODONE+OXYMORPHONE UR QL SCN: NEGATIVE ng/mL
PHENCYCLIDINE QUANTITATIVE URINE: NEGATIVE ng/mL
PROPOXYPHENE SCREEN URINE: NEGATIVE ng/mL
Ph of Urine: 7.3 (ref 4.5–8.9)
SPECIFIC GRAVITY: 1.02
TRAMADOL SCREEN, URINE: NEGATIVE ng/mL

## 2016-08-05 LAB — MICROSCOPIC EXAMINATION
Casts: NONE SEEN /lpf
Epithelial Cells (non renal): >10 /hpf — AB (ref 0–10)

## 2016-08-05 LAB — URINALYSIS, ROUTINE W REFLEX MICROSCOPIC
BILIRUBIN UA: NEGATIVE
GLUCOSE, UA: NEGATIVE
KETONES UA: NEGATIVE
NITRITE UA: POSITIVE — AB
RBC UA: NEGATIVE
SPEC GRAV UA: 1.025 (ref 1.005–1.030)
UUROB: 1 mg/dL (ref 0.2–1.0)
pH, UA: 8 — ABNORMAL HIGH (ref 5.0–7.5)

## 2016-08-05 LAB — GC/CHLAMYDIA PROBE AMP
CHLAMYDIA, DNA PROBE: NEGATIVE
NEISSERIA GONORRHOEAE BY PCR: NEGATIVE

## 2016-08-05 LAB — NICOTINE SCREEN, URINE: Cotinine Ql Scrn, Ur: NEGATIVE ng/mL

## 2016-08-05 MED ORDER — NITROFURANTOIN MONOHYD MACRO 100 MG PO CAPS: 100.0000 mg | ORAL_CAPSULE | Freq: Two times a day (BID) | ORAL | 1 refills | Status: DC

## 2016-08-07 LAB — URINE CULTURE, OB REFLEX

## 2016-08-07 LAB — CULTURE, OB URINE

## 2016-08-10 ENCOUNTER — Encounter: Payer: Self-pay | Admitting: Emergency Medicine

## 2016-08-10 ENCOUNTER — Emergency Department
Admission: EM | Admit: 2016-08-10 | Discharge: 2016-08-10 | Disposition: A | Discharge status: Home / Self Care | Attending: Emergency Medicine | Admitting: Emergency Medicine

## 2016-08-10 ENCOUNTER — Telehealth: Payer: Self-pay | Admitting: Obstetrics and Gynecology

## 2016-08-10 ENCOUNTER — Emergency Department

## 2016-08-10 ENCOUNTER — Other Ambulatory Visit: Payer: Self-pay | Admitting: *Deleted

## 2016-08-10 DIAGNOSIS — Z3A09 9 weeks gestation of pregnancy: Secondary | ICD-10-CM | POA: Diagnosis not present

## 2016-08-10 DIAGNOSIS — R319 Hematuria, unspecified: Secondary | ICD-10-CM | POA: Diagnosis not present

## 2016-08-10 DIAGNOSIS — Z79899 Other long term (current) drug therapy: Secondary | ICD-10-CM | POA: Insufficient documentation

## 2016-08-10 DIAGNOSIS — O200 Threatened abortion: Secondary | ICD-10-CM | POA: Diagnosis not present

## 2016-08-10 DIAGNOSIS — O26891 Other specified pregnancy related conditions, first trimester: Secondary | ICD-10-CM | POA: Diagnosis present

## 2016-08-10 LAB — CBC WITH DIFFERENTIAL/PLATELET
Basophils Absolute: 0 10*3/uL (ref 0–0.1)
Basophils Relative: 0 %
Eosinophils Absolute: 0 10*3/uL (ref 0–0.7)
Eosinophils Relative: 0 %
HEMATOCRIT: 37.3 % (ref 35.0–47.0)
HEMOGLOBIN: 13.1 g/dL (ref 12.0–16.0)
LYMPHS ABS: 0.5 10*3/uL — AB (ref 1.0–3.6)
LYMPHS PCT: 8 %
MCH: 29.9 pg (ref 26.0–34.0)
MCHC: 35 g/dL (ref 32.0–36.0)
MCV: 85.3 fL (ref 80.0–100.0)
Monocytes Absolute: 0.5 10*3/uL (ref 0.2–0.9)
Monocytes Relative: 8 %
NEUTROS ABS: 4.7 10*3/uL (ref 1.4–6.5)
NEUTROS PCT: 84 %
Platelets: 193 10*3/uL (ref 150–440)
RBC: 4.37 MIL/uL (ref 3.80–5.20)
RDW: 13.7 % (ref 11.5–14.5)
WBC: 5.6 10*3/uL (ref 3.6–11.0)

## 2016-08-10 LAB — COMPREHENSIVE METABOLIC PANEL
ALK PHOS: 61 U/L (ref 38–126)
ALT: 16 U/L (ref 14–54)
AST: 24 U/L (ref 15–41)
Albumin: 3.8 g/dL (ref 3.5–5.0)
Anion gap: 6 (ref 5–15)
BUN: 8 mg/dL (ref 6–20)
CALCIUM: 9 mg/dL (ref 8.9–10.3)
CO2: 23 mmol/L (ref 22–32)
CREATININE: 0.54 mg/dL (ref 0.44–1.00)
Chloride: 105 mmol/L (ref 101–111)
Glucose, Bld: 101 mg/dL — ABNORMAL HIGH (ref 65–99)
Potassium: 3.3 mmol/L — ABNORMAL LOW (ref 3.5–5.1)
Sodium: 134 mmol/L — ABNORMAL LOW (ref 135–145)
Total Bilirubin: 0.8 mg/dL (ref 0.3–1.2)
Total Protein: 7.4 g/dL (ref 6.5–8.1)

## 2016-08-10 LAB — URINALYSIS COMPLETE WITH MICROSCOPIC (ARMC ONLY)
Bilirubin Urine: NEGATIVE
GLUCOSE, UA: NEGATIVE mg/dL
Hgb urine dipstick: NEGATIVE
Ketones, ur: NEGATIVE mg/dL
NITRITE: NEGATIVE
Protein, ur: NEGATIVE mg/dL
SPECIFIC GRAVITY, URINE: 1.009 (ref 1.005–1.030)
pH: 6 (ref 5.0–8.0)

## 2016-08-10 LAB — HCG, QUANTITATIVE, PREGNANCY: HCG, BETA CHAIN, QUANT, S: 126237 m[IU]/mL — AB (ref <5)

## 2016-08-10 NOTE — ED Provider Notes (Signed)
[facility] Emergency Department Provider Note  Time seen: 3:26 PM  I have reviewed the triage vital signs and the nursing notes.   HISTORY  Chief Complaint Hematuria    HPI Stephanie Logan is a 25 y.o. female approximately [redacted] weeks pregnant who presents to the emergency department with generalized fatigue/bodyaches and bloody urine. According to the patient approximately one week agoshe was diagnosed with urinary tract infection by her OB/GYN. Placed on antibiotics. She states over the past week or 2 she has been feeling general fatigue and body aches. She states yesterday after urinating she wiped and saw some blood on the tissue paper. Patient is in the blood was from the urinary tract infection so she came to the emergency department today for evaluation. Denies any fever. Denies vomiting. Denies abdominal pain.  Past Medical History:  Diagnosis Date  . Amenorrhea   . Anxiety   . PCOS (polycystic ovarian syndrome)   . Post partum depression     Patient Active Problem List   Diagnosis Date Noted  . Dysmenorrhea 06/18/2016  . Chronic pain of right hip 02/18/2016  . Chronic pain of right knee 02/18/2016  . Depression with anxiety 02/18/2016  . Pigmented skin lesion 02/18/2016  . Severe episode of recurrent major depressive disorder, without psychotic features ([place])   . PCOS (polycystic ovarian syndrome) 07/19/2015    Past Surgical History:  Procedure Laterality Date  . BREAST FIBROADENOMA SURGERY  2008,2010    Prior to Admission medications   Medication Sig Start Date End Date Taking? Authorizing Provider  clonazePAM (KLONOPIN) 0.5 MG tablet Take 1 tablet (0.5 mg total) by mouth 2 (two) times daily as needed for anxiety. Patient not taking: Reported on 08/03/2016 06/18/16   [name], CNM  cyclobenzaprine (FLEXERIL) 10 MG tablet Take 1 tablet (10 mg total) by mouth 3 (three) times daily as needed for muscle spasms. Patient not taking:  Reported on 08/03/2016 12/09/15   Jami L Hagler, PA-C  FLUoxetine (PROZAC) 40 MG capsule Take 1 capsule (40 mg total) by mouth daily. Patient taking differently: Take 60 mg by mouth daily.  05/04/16   [name], CNM  nitrofurantoin, macrocrystal-monohydrate, (MACROBID) 100 MG capsule Take 1 capsule (100 mg total) by mouth 2 (two) times daily. 08/05/16   [name], CNM  Prenatal Vit-Fe Fumarate-FA (MULTIVITAMIN-PRENATAL) 27-0.8 MG TABS tablet Take 1 tablet by mouth daily at 12 noon.    Historical Provider, MD  tranexamic acid (LYSTEDA) 650 MG TABS tablet Take 2 tablets (1,300 mg total) by mouth 3 (three) times daily. Take during menses for a maximum of five days Patient not taking: Reported on 08/03/2016 06/18/16   [name], CNM  traZODone (DESYREL) 50 MG tablet Take 1 tablet (50 mg total) by mouth at bedtime. Patient not taking: Reported on 08/03/2016 07/22/15   Hildred Priest, MD    Allergies  Allergen Reactions  . Ciprofloxacin Rash    Family History  Problem Relation Age of Onset  . Cancer Maternal Grandmother     brain  . Cancer Mother     cervical, precancerous  . Seizures Sister     Social History Social History  Substance Use Topics  . Smoking status: Never Smoker  . Smokeless tobacco: Never Used  . Alcohol use Yes     Comment: occas    Review of Systems Constitutional: Negative for fever. Cardiovascular: Negative for chest pain. Respiratory: Negative for shortness of breath Gastrointestinal: Negative for abdominal pain,  vomiting and diarrhea. Genitourinary: Negative for dysuria.Possible hematuria. Denies vaginal discharge. Musculoskeletal: Generalized body aches. Neurological: Negative for headache 10-point ROS otherwise negative.  ____________________________________________   PHYSICAL EXAM:  VITAL SIGNS: ED Triage Vitals  Enc Vitals Group     BP 08/10/16 1206 112/66     Pulse Rate 08/10/16 1206 98     Resp 08/10/16 1206  20     Temp 08/10/16 1206 98 F (36.7 C)     Temp Source 08/10/16 1206 Oral     SpO2 08/10/16 1206 96 %     Weight 08/10/16 1207 140 lb (63.5 kg)     Height 08/10/16 1207 5\' 3"  (1.6 m)     Head Circumference --      Peak Flow --      Pain Score 08/10/16 1156 6     Pain Loc --      Pain Edu? --      Excl. in [place]? --     Constitutional: Alert and oriented. Well appearing and in no distress. Eyes: Normal exam ENT   Head: Normocephalic and atraumatic.   Mouth/Throat: Mucous membranes are moist. Cardiovascular: Normal rate, regular rhythm. No murmurs, rubs, or gallops. Respiratory: Normal respiratory effort without tachypnea nor retractions. Breath sounds are clear Gastrointestinal: Soft, minimal left lower quadrant tenderness palpation. No rebound or guarding. No distention. Musculoskeletal: Nontender with normal range of motion in all extremities. Neurologic:  Normal speech and language. No gross focal neurologic deficits  Skin:  Skin is warm, dry and intact.  Psychiatric: Mood and affect are normal. Speech and behavior are normal.   ____________________________________________     RADIOLOGY  Ultrasound shows single live intrauterine pregnancy at 9 weeks and 2 days.  ____________________________________________   INITIAL IMPRESSION / ASSESSMENT AND PLAN / ED COURSE  Pertinent labs & imaging results that were available during my care of the patient were reviewed by me and considered in my medical decision making (see chart for details).  The patient presents the emergency department with generalized body aches, possible hematuria. Patient's workup today is largely within normal limits. Blood work is normal. Urinalysis shows no red blood cells or white blood cells. I reviewed the patient's records she did recently have an Escherichia coli urinary tract infection which was sensitive to Macrobid, which the patient has taken. Given the negative urinalysis and blood on the  tissue paper after wiping this to be concerning for possible vaginal spotting. Denies any today. Patient is O+ blood type. We'll obtain an ultrasound to further evaluate. Patient is agreeable to plan.  Ultrasound consistent with single intrauterine pregnancy at 9 weeks 2 days, no abnormality. Given the possible vaginal spotting we'll have the patient follow up with her OB/GYN. I discussed the results with the patient who is agreeable.  ____________________________________________   FINAL CLINICAL IMPRESSION(S) / ED DIAGNOSES  Threatened miscarriage    Harvest Dark, MD 08/10/16 [phone]

## 2016-08-10 NOTE — Telephone Encounter (Signed)
Called pt advised her that [facility] said to go to ED, pt voiced understanding

## 2016-08-10 NOTE — Telephone Encounter (Signed)
She started an antibiotic last week for UTI and she is not feeling any better, she is worse, hurting worse, running a fever, nauseated, kidney throbbing.

## 2016-08-10 NOTE — ED Triage Notes (Signed)
Reports blood in urine and bilat flank pain, worse on right.

## 2016-08-10 NOTE — ED Notes (Signed)
Pt states she is [redacted] weeks pregnant. Pt reports saw PCP three days ago, diagnosed with UTI but over the weekend began to have bilateral flank pain that radiates to groin. Pt reports some nausea,vomiting and diarrhea two days ago as well.

## 2016-08-11 ENCOUNTER — Ambulatory Visit (INDEPENDENT_AMBULATORY_CARE_PROVIDER_SITE_OTHER): Admitting: Obstetrics and Gynecology

## 2016-08-11 VITALS — BP 99/66 | HR 97 | Wt 136.9 lb

## 2016-08-11 DIAGNOSIS — Z3A09 9 weeks gestation of pregnancy: Secondary | ICD-10-CM

## 2016-08-11 LAB — POCT URINALYSIS DIPSTICK
Glucose, UA: NEGATIVE
Ketones, UA: 15
Nitrite, UA: NEGATIVE
RBC UA: NEGATIVE
SPEC GRAV UA: 1.015
UROBILINOGEN UA: 0.2
pH, UA: 6

## 2016-08-11 NOTE — Progress Notes (Signed)
OB WORK IN- pt went to ER yesterday, was dx with UTI last week, pt is achy, R kidney area, very nauseated

## 2016-08-11 NOTE — Progress Notes (Signed)
ED follow up- feeling better today, OK to stop macrobid. Reassured of all normal findings.

## 2016-08-19 ENCOUNTER — Other Ambulatory Visit

## 2016-08-19 ENCOUNTER — Other Ambulatory Visit: Payer: Self-pay | Admitting: *Deleted

## 2016-08-19 DIAGNOSIS — Z3A10 10 weeks gestation of pregnancy: Secondary | ICD-10-CM

## 2016-08-27 LAB — MATERNIT21  PLUS CORE+ESS+SCA, BLOOD
Chromosome 13: NEGATIVE
Chromosome 18: NEGATIVE
Chromosome 21: NEGATIVE
Y Chromosome: NOT DETECTED

## 2016-09-02 ENCOUNTER — Encounter: Payer: Self-pay | Admitting: Obstetrics and Gynecology

## 2016-09-02 ENCOUNTER — Ambulatory Visit (INDEPENDENT_AMBULATORY_CARE_PROVIDER_SITE_OTHER): Admitting: Obstetrics and Gynecology

## 2016-09-02 VITALS — BP 103/58 | HR 87 | Wt 138.9 lb

## 2016-09-02 DIAGNOSIS — Z3492 Encounter for supervision of normal pregnancy, unspecified, second trimester: Secondary | ICD-10-CM

## 2016-09-02 LAB — POCT URINALYSIS DIPSTICK
GLUCOSE UA: NEGATIVE
Ketones, UA: NEGATIVE
Leukocytes, UA: NEGATIVE
NITRITE UA: NEGATIVE
PH UA: 6.5
Protein, UA: NEGATIVE
RBC UA: NEGATIVE
SPEC GRAV UA: 1.015
UROBILINOGEN UA: 0.2

## 2016-09-02 NOTE — Patient Instructions (Signed)
Second Trimester of Pregnancy The second trimester is from week 13 through week 28 (months 4 through 6). The second trimester is often a time when you feel your best. Your body has also adjusted to being pregnant, and you begin to feel better physically. Usually, morning sickness has lessened or quit completely, you may have more energy, and you may have an increase in appetite. The second trimester is also a time when the fetus is growing rapidly. At the end of the sixth month, the fetus is about 9 inches long and weighs about 1 pounds. You will likely begin to feel the baby move (quickening) between 18 and 20 weeks of the pregnancy. Body changes during your second trimester Your body continues to go through many changes during your second trimester. The changes vary from woman to woman.  Your weight will continue to increase. You will notice your lower abdomen bulging out.  You may begin to get stretch marks on your hips, abdomen, and breasts.  You may develop headaches that can be relieved by medicines. The medicines should be approved by your health care provider.  You may urinate more often because the fetus is pressing on your bladder.  You may develop or continue to have heartburn as a result of your pregnancy.  You may develop constipation because certain hormones are causing the muscles that push waste through your intestines to slow down.  You may develop hemorrhoids or swollen, bulging veins (varicose veins).  You may have back pain. This is caused by:  Weight gain.  Pregnancy hormones that are relaxing the joints in your pelvis.  A shift in weight and the muscles that support your balance.  Your breasts will continue to grow and they will continue to become tender.  Your gums may bleed and may be sensitive to brushing and flossing.  Dark spots or blotches (chloasma, mask of pregnancy) may develop on your face. This will likely fade after the baby is born.  A dark line  from your belly button to the pubic area (linea nigra) may appear. This will likely fade after the baby is born.  You may have changes in your hair. These can include thickening of your hair, rapid growth, and changes in texture. Some women also have hair loss during or after pregnancy, or hair that feels dry or thin. Your hair will most likely return to normal after your baby is born. What to expect at prenatal visits During a routine prenatal visit:  You will be weighed to make sure you and the fetus are growing normally.  Your blood pressure will be taken.  Your abdomen will be measured to track your baby's growth.  The fetal heartbeat will be listened to.  Any test results from the previous visit will be discussed. Your health care provider may ask you:  How you are feeling.  If you are feeling the baby move.  If you have had any abnormal symptoms, such as leaking fluid, bleeding, severe headaches, or abdominal cramping.  If you are using any tobacco products, including cigarettes, chewing tobacco, and electronic cigarettes.  If you have any questions. Other tests that may be performed during your second trimester include:  Blood tests that check for:  Low iron levels (anemia).  Gestational diabetes (between 24 and 28 weeks).  Rh antibodies. This is to check for a protein on red blood cells (Rh factor).  Urine tests to check for infections, diabetes, or protein in the urine.  An ultrasound to  confirm the proper growth and development of the baby.  An amniocentesis to check for possible genetic problems.  Fetal screens for spina bifida and Down syndrome.  HIV (human immunodeficiency virus) testing. Routine prenatal testing includes screening for HIV, unless you choose not to have this test. Follow these instructions at home: Eating and drinking  Continue to eat regular, healthy meals.  Avoid raw meat, uncooked cheese, cat litter boxes, and soil used by cats. These  carry germs that can cause birth defects in the baby.  Take your prenatal vitamins.  Take 1500-2000 mg of calcium daily starting at the 20th week of pregnancy until you deliver your baby.  If you develop constipation:  Take over-the-counter or prescription medicines.  Drink enough fluid to keep your urine clear or pale yellow.  Eat foods that are high in fiber, such as fresh fruits and vegetables, whole grains, and beans.  Limit foods that are high in fat and processed sugars, such as fried and sweet foods. Activity  Exercise only as directed by your health care provider. Experiencing uterine cramps is a good sign to stop exercising.  Avoid heavy lifting, wear low heel shoes, and practice good posture.  Wear your seat belt at all times when driving.  Rest with your legs elevated if you have leg cramps or low back pain.  Wear a good support bra for breast tenderness.  Do not use hot tubs, steam rooms, or saunas. Lifestyle  Avoid all smoking, herbs, alcohol, and unprescribed drugs. These chemicals affect the formation and growth of the baby.  Do not use any products that contain nicotine or tobacco, such as cigarettes and e-cigarettes. If you need help quitting, ask your health care provider.  A sexual relationship may be continued unless your health care provider directs you otherwise. General instructions  Follow your health care provider's instructions regarding medicine use. There are medicines that are either safe or unsafe to take during pregnancy.  Take warm sitz baths to soothe any pain or discomfort caused by hemorrhoids. Use hemorrhoid cream if your health care provider approves.  If you develop varicose veins, wear support hose. Elevate your feet for 15 minutes, 3-4 times a day. Limit salt in your diet.  Visit your dentist if you have not gone yet during your pregnancy. Use a soft toothbrush to brush your teeth and be gentle when you floss.  Keep all follow-up  prenatal visits as told by your health care provider. This is important. Contact a health care provider if:  You have dizziness.  You have mild pelvic cramps, pelvic pressure, or nagging pain in the abdominal area.  You have persistent nausea, vomiting, or diarrhea.  You have a bad smelling vaginal discharge.  You have pain with urination. Get help right away if:  You have a fever.  You are leaking fluid from your vagina.  You have spotting or bleeding from your vagina.  You have severe abdominal cramping or pain.  You have rapid weight gain or weight loss.  You have shortness of breath with chest pain.  You notice sudden or extreme swelling of your face, hands, ankles, feet, or legs.  You have not felt your baby move in over an hour.  You have severe headaches that do not go away with medicine.  You have vision changes. Summary  The second trimester is from week 13 through week 28 (months 4 through 6). It is also a time when the fetus is growing rapidly.  Your body goes  through many changes during pregnancy. The changes vary from woman to woman.  Avoid all smoking, herbs, alcohol, and unprescribed drugs. These chemicals affect the formation and growth your baby.  Do not use any tobacco products, such as cigarettes, chewing tobacco, and e-cigarettes. If you need help quitting, ask your health care provider.  Contact your health care provider if you have any questions. Keep all prenatal visits as told by your health care provider. This is important. This information is not intended to replace advice given to you by your health care provider. Make sure you discuss any questions you have with your health care provider. Document Released: 09/01/2001 Document Revised: 02/13/2016 Document Reviewed: 11/08/2012 Elsevier Interactive Patient Education  2017 [employer].

## 2016-09-02 NOTE — Progress Notes (Signed)
NEW OB HISTORY AND PHYSICAL  SUBJECTIVE:       Stephanie Logan is a 25 y.o. [phone] female, Patient's last menstrual period was 04/22/2016 (lmp unknown)., Estimated Date of Delivery: 03/13/17, [redacted]w[redacted]d, presents today for establishment of Prenatal Care. She has no unusual complaints and complains of nothing      Gynecologic History Patient's last menstrual period was 04/22/2016 (lmp unknown). Normal Contraception: none Last Pap: 2017. Results were: normal  Obstetric History OB History  Gravida Para Term Preterm AB Living  4 0 0 0 1 2  SAB TAB Ectopic Multiple Live Births  1 0 0 0 2    # Outcome Date GA Lbr Len/2nd Weight Sex Delivery Anes PTL Lv  4 Current           3 Gravida 01/27/15    M Vag-Spont   LIV  2 SAB 01/2014        ND  1 Gravida 01/19/13    M Vag-Spont   LIV     Complications: Anal sphincter tear in delivery without 3rd degree perineal laceration      Past Medical History:  Diagnosis Date  . Amenorrhea   . Anxiety   . PCOS (polycystic ovarian syndrome)   . Post partum depression     Past Surgical History:  Procedure Laterality Date  . BREAST FIBROADENOMA SURGERY  2008,2010    Current Outpatient Prescriptions on File Prior to Visit  Medication Sig Dispense Refill  . FLUoxetine (PROZAC) 40 MG capsule Take 1 capsule (40 mg total) by mouth daily. (Patient taking differently: Take 60 mg by mouth daily. ) 60 capsule 3  . Prenatal Vit-Fe Fumarate-FA (MULTIVITAMIN-PRENATAL) 27-0.8 MG TABS tablet Take 1 tablet by mouth daily at 12 noon.    . nitrofurantoin, macrocrystal-monohydrate, (MACROBID) 100 MG capsule Take 1 capsule (100 mg total) by mouth 2 (two) times daily. (Patient not taking: Reported on 09/02/2016) 14 capsule 1   No current facility-administered medications on file prior to visit.     Allergies  Allergen Reactions  . Ciprofloxacin Rash    Social History   Social History  . Marital status: Married    Spouse name: N/A  . Number of children:  N/A  . Years of education: N/A   Occupational History  . Not on file.   Social History Main Topics  . Smoking status: Never Smoker  . Smokeless tobacco: Never Used  . Alcohol use Yes     Comment: occas  . Drug use: No  . Sexual activity: Yes    Birth control/ protection: None   Other Topics Concern  . Not on file   Social History Narrative  . No narrative on file    Family History  Problem Relation Age of Onset  . Cancer Maternal Grandmother     brain  . Cancer Mother     cervical, precancerous  . Seizures Sister     The following portions of the patient's history were reviewed and updated as appropriate: allergies, current medications, past OB history, past medical history, past surgical history, past family history, past social history, and problem list.    OBJECTIVE: Initial Physical Exam (New OB)  GENERAL APPEARANCE: alert, well appearing, in no apparent distress, oriented to person, place and time HEAD: normocephalic, atraumatic MOUTH: mucous membranes moist, pharynx normal without lesions and dental hygiene good THYROID: no thyromegaly or masses present BREASTS: not examined LUNGS: clear to auscultation, no wheezes, rales or rhonchi, symmetric air entry HEART: regular rate and  rhythm, no murmurs ABDOMEN: soft, nontender, nondistended, no abnormal masses, no epigastric pain, fundus not palpable and FHT present EXTREMITIES: no redness or tenderness in the calves or thighs SKIN: normal coloration and turgor, no rashes LYMPH NODES: no adenopathy palpable NEUROLOGIC: alert, oriented, normal speech, no focal findings or movement disorder noted  PELVIC EXAM not indicated  ASSESSMENT: Normal pregnancy  PLAN: Prenatal care See orders

## 2016-09-02 NOTE — Progress Notes (Signed)
NOB- pt is doing well, denies any complaints

## 2016-09-07 ENCOUNTER — Telehealth: Payer: Self-pay | Admitting: Obstetrics and Gynecology

## 2016-09-07 NOTE — Telephone Encounter (Signed)
States for the last 5 days, she gets a headache around 1:00 pm, denies any blurred vision, of increased swelling, has used the butalbital in the past pregnancies with good results, pls advise

## 2016-09-07 NOTE — Telephone Encounter (Signed)
[redacted] wks pregnant and has had a headache 5 DAYS.

## 2016-09-09 ENCOUNTER — Other Ambulatory Visit: Payer: Self-pay | Admitting: Obstetrics and Gynecology

## 2016-09-09 MED ORDER — BUTALBITAL-APAP-CAFFEINE 50-325-40 MG PO CAPS: 1.0000 | ORAL_CAPSULE | Freq: Four times a day (QID) | ORAL | 3 refills | Status: DC | PRN

## 2016-09-09 NOTE — Telephone Encounter (Signed)
Please let her know we have a script ready for her

## 2016-09-09 NOTE — Telephone Encounter (Signed)
rx was faxed.

## 2016-09-21 HISTORY — PX: BREAST EXCISIONAL BIOPSY: SUR124

## 2016-09-21 NOTE — L&D Delivery Note (Signed)
Delivery Note At  0143 a non-viable female "Stephanie Logan" was delivered via  (Presentation:LOA ;  ).  APGAR: ,0 ; weight  .   Placenta status: delivered intact with 3 vessel  Cord:  with the following complications: fetal demise with nuchal cord x3 and knot in cord on second loop.  Anesthesia:  epidural Episiotomy:  none Lacerations:  none Suture Repair: NA Est. Blood Loss (mL):  200  Mom to remain in L&D till discharge.  Baby to [facility].  [name] 02/08/2017, 2:00 AM

## 2016-10-02 ENCOUNTER — Encounter: Payer: Self-pay | Admitting: Obstetrics and Gynecology

## 2016-10-09 ENCOUNTER — Other Ambulatory Visit: Payer: Self-pay | Admitting: Obstetrics and Gynecology

## 2016-10-09 ENCOUNTER — Other Ambulatory Visit (INDEPENDENT_AMBULATORY_CARE_PROVIDER_SITE_OTHER)

## 2016-10-09 ENCOUNTER — Ambulatory Visit (INDEPENDENT_AMBULATORY_CARE_PROVIDER_SITE_OTHER): Admitting: Obstetrics and Gynecology

## 2016-10-09 VITALS — BP 105/62 | HR 82 | Wt 143.1 lb

## 2016-10-09 DIAGNOSIS — Z3492 Encounter for supervision of normal pregnancy, unspecified, second trimester: Secondary | ICD-10-CM

## 2016-10-09 DIAGNOSIS — Z369 Encounter for antenatal screening, unspecified: Secondary | ICD-10-CM | POA: Diagnosis not present

## 2016-10-09 LAB — POCT URINALYSIS DIPSTICK
BILIRUBIN UA: NEGATIVE
Glucose, UA: NEGATIVE
KETONES UA: NEGATIVE
LEUKOCYTES UA: NEGATIVE
Nitrite, UA: NEGATIVE
PH UA: 6
PROTEIN UA: NEGATIVE
RBC UA: NEGATIVE
SPEC GRAV UA: 1.01
Urobilinogen, UA: 0.2

## 2016-10-09 MED ORDER — FLUOXETINE HCL 40 MG PO CAPS: 40.0000 mg | ORAL_CAPSULE | Freq: Every day | ORAL | 1 refills | Status: DC

## 2016-10-09 MED ORDER — BUTALBITAL-APAP-CAFFEINE 50-325-40 MG PO CAPS: 1.0000 | ORAL_CAPSULE | Freq: Four times a day (QID) | ORAL | 3 refills | Status: DC | PRN

## 2016-10-09 MED ORDER — FLUOXETINE HCL 20 MG PO CAPS: 20.0000 mg | ORAL_CAPSULE | Freq: Every day | ORAL | 3 refills | Status: DC

## 2016-10-09 NOTE — Progress Notes (Signed)
ROB- pt is having a lot of headaches, states they last all day

## 2016-10-09 NOTE — Progress Notes (Signed)
ROB-daily HA. meds and dark room helps.fetal bradycardia noted at beginning, with resolution to 130s, U/s done and revealed:  Indications: Presentation, viability, Fluid check Findings:  Nelda [place]se[place] intrauterine pregnancy is visualized with FHR at 135 BPM, (checked a second time and was 137 BPM). Biometrics give an (U/S) Gestational age of [redacted] weeks and 3 days, and an (U/S) EDD of 03/16/17; this correlates with the clinically established EDD of 03/13/17.  Fetal presentation is high in the fundus and transverse with the head at maternal right, spine is posterior.  EFW: 191 grams ( 0 lbs. 7 oz. ) Placenta: Anterior AFI: Subjectively normal with a MVP of 3.8 cm.    Impression: 1. 17 week 3 day Viable Singleton Intrauterine pregnancy by U/S. 2. (U/S) EDD is consistent with Clinically established (LMP) EDD of 03/13/17. 3. Consistent fetal heart rate during scan.

## 2016-10-21 ENCOUNTER — Ambulatory Visit (INDEPENDENT_AMBULATORY_CARE_PROVIDER_SITE_OTHER): Admitting: Obstetrics and Gynecology

## 2016-10-21 ENCOUNTER — Ambulatory Visit (INDEPENDENT_AMBULATORY_CARE_PROVIDER_SITE_OTHER)

## 2016-10-21 VITALS — BP 108/64 | HR 88 | Wt 144.1 lb

## 2016-10-21 DIAGNOSIS — Z3492 Encounter for supervision of normal pregnancy, unspecified, second trimester: Secondary | ICD-10-CM

## 2016-10-21 DIAGNOSIS — Z369 Encounter for antenatal screening, unspecified: Secondary | ICD-10-CM | POA: Diagnosis not present

## 2016-10-21 LAB — POCT URINALYSIS DIPSTICK
Bilirubin, UA: NEGATIVE
Glucose, UA: NEGATIVE
Ketones, UA: NEGATIVE
LEUKOCYTES UA: NEGATIVE
NITRITE UA: NEGATIVE
PH UA: 6.5
Protein, UA: NEGATIVE
RBC UA: NEGATIVE
Spec Grav, UA: 1.015
UROBILINOGEN UA: 0.2

## 2016-10-21 NOTE — Progress Notes (Signed)
ROB- anatomy scan, pt is doing well, head & chest congestion x 3 days- clear, some facial pressure

## 2016-10-21 NOTE — Progress Notes (Signed)
Indications: Anatomy Findings:  Singleton intrauterine pregnancy is visualized with FHR at  BPM. Biometrics give an (U/S) Gestational age of [redacted] weeks and 2 days, and an (U/S) EDD of 03/15/17; this correlates with the clinically established EDD of 03/13/17.  Fetal presentation is Breech, spine posterior.  EFW: 283 grams ( 0 lbs. 10 oz. ) Placenta: Anterior, grade 0 and remote to cervix at 5.1 cm. AFI: Subjectively adequate with an MVP of 5.1 cm. ROB & anatomy scan:  Anatomic survey is complete and appears WNL. Gender - Female.   Right Ovary measures 3.3 x 3.2 x 2.2 cm, and appears WNL. Left Ovary measures 4.0 x 2.1 x 2.6 cm, and appears WNL. There is no evidence of a corpus luteal cyst. Survey of the adnexa demonstrates no adnexal masses. There is no free peritoneal fluid in the cul de sac.  Impression: 1. 19 week 2 day Viable Singleton Intrauterine pregnancy by U/S. 2. (U/S) EDD is consistent with Clinically established (LMP) EDD of 03/13/17. 3. Normal appearing anatomy scan.

## 2016-11-19 ENCOUNTER — Ambulatory Visit (INDEPENDENT_AMBULATORY_CARE_PROVIDER_SITE_OTHER): Admitting: Certified Nurse Midwife

## 2016-11-19 ENCOUNTER — Encounter: Payer: Self-pay | Admitting: Certified Nurse Midwife

## 2016-11-19 VITALS — BP 96/55 | HR 80 | Wt 146.6 lb

## 2016-11-19 DIAGNOSIS — Z3492 Encounter for supervision of normal pregnancy, unspecified, second trimester: Secondary | ICD-10-CM | POA: Diagnosis not present

## 2016-11-19 LAB — POCT URINALYSIS DIPSTICK
BILIRUBIN UA: NEGATIVE
GLUCOSE UA: NEGATIVE
Ketones, UA: NEGATIVE
NITRITE UA: NEGATIVE
Protein, UA: NEGATIVE
RBC UA: NEGATIVE
Spec Grav, UA: 1.01
Urobilinogen, UA: NEGATIVE
pH, UA: 6

## 2016-11-19 NOTE — Progress Notes (Signed)
Pt c/o weakness, nausea, dizziness mostly at bedtime whether she has eaten or not. Advised to increase snacks, food intake and fluids. Pt states she has a wrist B/P someone just gave her and will also start checking her B/P at home.

## 2016-11-19 NOTE — Progress Notes (Signed)
ROB-Pt doing well. Reports intermittent periods of dizziness. Denies falling or loss of consciousness. Encouraged 80-100 ounces water daily, frequent meals with small snacks, and noting episodes to establish trends. Discussed dizziness safety techniques. Reviewed red flag symptoms and when to call. RTC x 4 weeks for glucola, H/H, and ROB.

## 2016-11-25 ENCOUNTER — Inpatient Hospital Stay
Admission: EM | Admit: 2016-11-25 | Discharge: 2016-11-25 | Disposition: A | Discharge status: Home / Self Care | Attending: Obstetrics and Gynecology | Admitting: Obstetrics and Gynecology

## 2016-11-25 DIAGNOSIS — R109 Unspecified abdominal pain: Secondary | ICD-10-CM | POA: Diagnosis not present

## 2016-11-25 DIAGNOSIS — O26892 Other specified pregnancy related conditions, second trimester: Secondary | ICD-10-CM | POA: Diagnosis not present

## 2016-11-25 DIAGNOSIS — M545 Low back pain: Secondary | ICD-10-CM | POA: Insufficient documentation

## 2016-11-25 DIAGNOSIS — Z3A25 25 weeks gestation of pregnancy: Secondary | ICD-10-CM | POA: Diagnosis not present

## 2016-11-25 DIAGNOSIS — R102 Pelvic and perineal pain: Secondary | ICD-10-CM | POA: Diagnosis present

## 2016-11-25 LAB — URINALYSIS, ROUTINE W REFLEX MICROSCOPIC
BILIRUBIN URINE: NEGATIVE
GLUCOSE, UA: NEGATIVE mg/dL
Hgb urine dipstick: NEGATIVE
KETONES UR: 5 mg/dL — AB
LEUKOCYTES UA: NEGATIVE
NITRITE: NEGATIVE
PROTEIN: NEGATIVE mg/dL
Specific Gravity, Urine: 1.01 (ref 1.005–1.030)
pH: 6 (ref 5.0–8.0)

## 2016-11-25 MED ORDER — CALCIUM CARBONATE ANTACID 500 MG PO CHEW: 2.0000 | CHEWABLE_TABLET | ORAL | Status: DC | PRN

## 2016-11-25 MED ORDER — ACETAMINOPHEN 325 MG PO TABS: 650.0000 mg | ORAL_TABLET | ORAL | Status: DC | PRN

## 2016-11-25 NOTE — OB Triage Note (Signed)
Patient came in for observation for lower abdominal and back pain since yesterday. Patient rates back pain 9 out of 10 and abdominal pain 6 out of 10. . Patient reports thin Stephanie Logan discharge than is increased more than normal. Patient states she has been feeling the baby move fine. Patient denies vaginal bleeding and spotting. Vital signs stable and patient afebrile. FHR baseline 135 with moderate variability and no decelerations. Husband at bedside. Will continue to monitor.

## 2016-11-25 NOTE — Discharge Summary (Signed)
Patient discharged with instructions on follow up appointment, oral hydration, labor precautions, Tylenol for pain medication, and when to seek medical attention. Patient ambulatory at discharge with steady gait. Patient discharged with husband. Was unable to print discharge AVS, but Melody, CNM was alright with discharging her without the print out of her discharge papers.

## 2016-11-26 ENCOUNTER — Telehealth: Payer: Self-pay | Admitting: Obstetrics and Gynecology

## 2016-11-26 NOTE — Telephone Encounter (Signed)
Pt called saying she is having lower back pain.  She is 24 weeks.  She went to L & D last night and they took urine and she has not heard back,  Please advise    Thanks Con Memos

## 2016-11-27 ENCOUNTER — Other Ambulatory Visit (INDEPENDENT_AMBULATORY_CARE_PROVIDER_SITE_OTHER): Admitting: Obstetrics and Gynecology

## 2016-11-27 ENCOUNTER — Encounter: Payer: Self-pay | Admitting: Obstetrics and Gynecology

## 2016-11-27 DIAGNOSIS — O2342 Unspecified infection of urinary tract in pregnancy, second trimester: Secondary | ICD-10-CM

## 2016-11-27 LAB — POCT URINALYSIS DIPSTICK
Bilirubin, UA: NEGATIVE
Ketones, UA: NEGATIVE
Nitrite, UA: NEGATIVE
RBC UA: NEGATIVE
SPEC GRAV UA: 1.01
UROBILINOGEN UA: 0.2
pH, UA: 7

## 2016-11-27 LAB — URINE CULTURE

## 2016-11-27 MED ORDER — NITROFURANTOIN MONOHYD MACRO 100 MG PO CAPS: 100.0000 mg | ORAL_CAPSULE | Freq: Two times a day (BID) | ORAL | 0 refills | Status: DC

## 2016-11-27 NOTE — Telephone Encounter (Signed)
Called pt she is going to come by office and do ua, looks like urine from hospital states needs recollecting

## 2016-11-29 LAB — URINE CULTURE

## 2016-12-01 ENCOUNTER — Observation Stay
Admission: EM | Admit: 2016-12-01 | Discharge: 2016-12-01 | Disposition: A | Discharge status: Home / Self Care | Attending: Obstetrics and Gynecology | Admitting: Obstetrics and Gynecology

## 2016-12-01 DIAGNOSIS — O2342 Unspecified infection of urinary tract in pregnancy, second trimester: Secondary | ICD-10-CM | POA: Insufficient documentation

## 2016-12-01 DIAGNOSIS — Z3A25 25 weeks gestation of pregnancy: Secondary | ICD-10-CM

## 2016-12-01 DIAGNOSIS — O4692 Antepartum hemorrhage, unspecified, second trimester: Secondary | ICD-10-CM | POA: Diagnosis present

## 2016-12-01 DIAGNOSIS — O26852 Spotting complicating pregnancy, second trimester: Secondary | ICD-10-CM | POA: Diagnosis present

## 2016-12-01 DIAGNOSIS — Z79899 Other long term (current) drug therapy: Secondary | ICD-10-CM | POA: Insufficient documentation

## 2016-12-01 NOTE — Discharge Instructions (Signed)
Vaginal Bleeding During Pregnancy, Second Trimester A small amount of bleeding (spotting) from the vagina is common in pregnancy. Sometimes the bleeding is normal and is not a problem, and sometimes it is a sign of something serious. Be sure to tell your doctor about any bleeding from your vagina right away. Follow these instructions at home:  Watch your condition for any changes.  Follow your doctor's instructions about how active you can be.  If you are on bed rest:  You may need to stay in bed and only get up to use the bathroom.  You may be allowed to do some activities.  If you need help, make plans for someone to help you.  Write down:  The number of pads you use each day.  How often you change pads.  How soaked (saturated) your pads are.  Do not use tampons.  Do not douche.  Do not have sex or orgasms until your doctor says it is okay.  If you pass any tissue from your vagina, save the tissue so you can show it to your doctor.  Only take medicines as told by your doctor.  Do not take aspirin because it can make you bleed.  Do not exercise, lift heavy weights, or do any activities that take a lot of energy and effort unless your doctor says it is okay.  Keep all follow-up visits as told by your doctor. Contact a doctor if:  You bleed from your vagina.  You have cramps.  You have labor pains.  You have a fever that does not go away after you take medicine. Get help right away if:  You have very bad cramps in your back or belly (abdomen).  You have contractions.  You have chills.  You pass large clots or tissue from your vagina.  You bleed more.  You feel light-headed or weak.  You pass out (faint).  You are leaking fluid or have a gush of fluid from your vagina. This information is not intended to replace advice given to you by your health care provider. Make sure you discuss any questions you have with your health care provider. Document  Released: 01/22/2014 Document Revised: 02/13/2016 Document Reviewed: 05/15/2013 Elsevier Interactive Patient Education  2017 [employer].

## 2016-12-01 NOTE — OB Triage Note (Signed)
Pt arrived to unit complaining of dark brown vaginal discharge. Pt states she notices some bleeding yesterday (11/30/16) around 8 pm with some mild cramping, but that it went away after 20 -30 min. Pt then noticed brownish discharge today around 5:30pm today. +FM, no headache or blurred vision. No leaking of fluid. No report of contractions or pain. Pt currently on antibiotics for a UTI.

## 2016-12-01 NOTE — Discharge Summary (Signed)
@  LOGO@  L&D OB Triage Note  SUBJECTIVE Stephanie Logan is a 26 y.o. [phone] female at [redacted]w[redacted]d, EDD Estimated Date of Delivery: 03/13/17 who presented to triage with complaints of vaginal spotting that started yesterday it was red in color. It was noticed today with wiping and described as dark in color. She denies need to wear a pad, recent intercourse or vigorous activity. She had a bowel movement yesterday that she strained to pass stool. She endorses positive fetal movement. No loss of fluid or contractions.   Obstetric History   G4   P0   T0   P0   A1   L2    SAB1   TAB0   Ectopic0   Multiple0   Live Births2     # Outcome Date GA Lbr Len/2nd Weight Sex Delivery Anes PTL Lv  4 Current           3 Gravida 01/27/15    M Vag-Spont   LIV  2 SAB 01/2014        ND  1 Gravida 01/19/13    M Vag-Spont   LIV     Complications: Anal sphincter tear in delivery without 3rd degree perineal laceration      Prescriptions Prior to Admission  Medication Sig Dispense Refill Last Dose  . FLUoxetine (PROZAC) 20 MG capsule Take 1 capsule (20 mg total) by mouth daily. 90 capsule 3 12/01/2016 at Unknown time  . FLUoxetine (PROZAC) 40 MG capsule Take 1 capsule (40 mg total) by mouth daily. 90 capsule 1 12/01/2016 at Unknown time  . nitrofurantoin, macrocrystal-monohydrate, (MACROBID) 100 MG capsule Take 1 capsule (100 mg total) by mouth 2 (two) times daily. 14 capsule 0 12/01/2016 at Unknown time  . Butalbital-APAP-Caffeine 50-325-40 MG capsule Take 1-2 capsules by mouth every 6 (six) hours as needed for headache. (Patient not taking: Reported on 12/01/2016) 30 capsule 3 Not Taking at Unknown time  . Prenatal Vit-Fe Fumarate-FA (MULTIVITAMIN-PRENATAL) 27-0.8 MG TABS tablet Take 1 tablet by mouth daily at 12 noon.   Taking     OBJECTIVE  Nursing Evaluation:   Temp 98.1 F (36.7 C) (Oral)   Resp 18   Ht 5\' 3"  (1.6 m)   Wt 145 lb (65.8 kg)   LMP 04/22/2016 (LMP Unknown)   BMI 25.69 kg/m    Findings: Abdomen  soft, non tender. Sterile speculum exam, no bleeding in the vagina or cervix. Normal white discharge noted. Cervix visualized-closed. Rectum: external tissue on the outside of the anus that did not appear inflamed (possible previous hemorrhoid).  Fetal monitoring was performed and has been reviewed by me. Baseline FHR: 135, moderate variability, 10 x 10 accelerations present. No decelerations, no contractions NST INTERPRETATION: Category I and approriate for gestational age,    ASSESSMENT Impression:  1. Pregnancy:  [name] at [redacted]w[redacted]d , EDD Estimated Date of Delivery: 03/13/17 2.  Appropriate for gestational age  PLAN 22. Reassurance given 2. Discharge home with precautions to return to L&D or call the office if:  increased leakage or fluid, decreased fetal movement or bleeding from vaginal area that is more than spotting when wiping. Contractions more than 5-6 hour.  3. Continue taking Macrobid for UTI 4.Continue routine prenatal care follow up at next [facility] appointment.

## 2016-12-17 ENCOUNTER — Ambulatory Visit (INDEPENDENT_AMBULATORY_CARE_PROVIDER_SITE_OTHER): Admitting: Obstetrics and Gynecology

## 2016-12-17 VITALS — BP 98/62 | HR 88 | Wt 148.6 lb

## 2016-12-17 DIAGNOSIS — Z23 Encounter for immunization: Secondary | ICD-10-CM

## 2016-12-17 DIAGNOSIS — Z131 Encounter for screening for diabetes mellitus: Secondary | ICD-10-CM

## 2016-12-17 DIAGNOSIS — Z3492 Encounter for supervision of normal pregnancy, unspecified, second trimester: Secondary | ICD-10-CM

## 2016-12-17 LAB — POCT URINALYSIS DIPSTICK
Bilirubin, UA: NEGATIVE
Blood, UA: NEGATIVE
Glucose, UA: NEGATIVE
NITRITE UA: NEGATIVE
PH UA: 6.5 (ref 5.0–8.0)
Spec Grav, UA: 1.015 (ref 1.030–1.035)
Urobilinogen, UA: 0.2 (ref ?–2.0)

## 2016-12-17 MED ORDER — TETANUS-DIPHTH-ACELL PERTUSSIS 5-2.5-18.5 LF-MCG/0.5 IM SUSP
0.5000 mL | Freq: Once | INTRAMUSCULAR | Status: AC
  Administered 2016-12-17: 0.5 mL via INTRAMUSCULAR

## 2016-12-17 NOTE — Patient Instructions (Signed)
Third Trimester of Pregnancy The third trimester is from week 28 through week 40 (months 7 through 9). The third trimester is a time when the unborn baby (fetus) is growing rapidly. At the end of the ninth month, the fetus is about 20 inches in length and weighs 6-10 pounds. Body changes during your third trimester Your body will continue to go through many changes during pregnancy. The changes vary from woman to woman. During the third trimester:  Your weight will continue to increase. You can expect to gain 25-35 pounds (11-16 kg) by the end of the pregnancy.  You may begin to get stretch marks on your hips, abdomen, and breasts.  You may urinate more often because the fetus is moving lower into your pelvis and pressing on your bladder.  You may develop or continue to have heartburn. This is caused by increased hormones that slow down muscles in the digestive tract.  You may develop or continue to have constipation because increased hormones slow digestion and cause the muscles that push waste through your intestines to relax.  You may develop hemorrhoids. These are swollen veins (varicose veins) in the rectum that can itch or be painful.  You may develop swollen, bulging veins (varicose veins) in your legs.  You may have increased body aches in the pelvis, back, or thighs. This is due to weight gain and increased hormones that are relaxing your joints.  You may have changes in your hair. These can include thickening of your hair, rapid growth, and changes in texture. Some women also have hair loss during or after pregnancy, or hair that feels dry or thin. Your hair will most likely return to normal after your baby is born.  Your breasts will continue to grow and they will continue to become tender. A yellow fluid (colostrum) may leak from your breasts. This is the first milk you are producing for your baby.  Your belly button may stick out.  You may notice more swelling in your hands,  face, or ankles.  You may have increased tingling or numbness in your hands, arms, and legs. The skin on your belly may also feel numb.  You may feel short of breath because of your expanding uterus.  You may have more problems sleeping. This can be caused by the size of your belly, increased need to urinate, and an increase in your body's metabolism.  You may notice the fetus "dropping," or moving lower in your abdomen (lightening).  You may have increased vaginal discharge.  You may notice your joints feel loose and you may have pain around your pelvic bone.  What to expect at prenatal visits You will have prenatal exams every 2 weeks until week 36. Then you will have weekly prenatal exams. During a routine prenatal visit:  You will be weighed to make sure you and the baby are growing normally.  Your blood pressure will be taken.  Your abdomen will be measured to track your baby's growth.  The fetal heartbeat will be listened to.  Any test results from the previous visit will be discussed.  You may have a cervical check near your due date to see if your cervix has softened or thinned (effaced).  You will be tested for Group B streptococcus. This happens between 35 and 37 weeks.  Your health care provider may ask you:  What your birth plan is.  How you are feeling.  If you are feeling the baby move.  If you have had   any abnormal symptoms, such as leaking fluid, bleeding, severe headaches, or abdominal cramping.  If you are using any tobacco products, including cigarettes, chewing tobacco, and electronic cigarettes.  If you have any questions.  Other tests or screenings that may be performed during your third trimester include:  Blood tests that check for low iron levels (anemia).  Fetal testing to check the health, activity level, and growth of the fetus. Testing is done if you have certain medical conditions or if there are problems during the  pregnancy.  Nonstress test (NST). This test checks the health of your baby to make sure there are no signs of problems, such as the baby not getting enough oxygen. During this test, a belt is placed around your belly. The baby is made to move, and its heart rate is monitored during movement.  What is false labor? False labor is a condition in which you feel small, irregular tightenings of the muscles in the womb (contractions) that usually go away with rest, changing position, or drinking water. These are called Braxton Hicks contractions. Contractions may last for hours, days, or even weeks before true labor sets in. If contractions come at regular intervals, become more frequent, increase in intensity, or become painful, you should see your health care provider. What are the signs of labor?  Abdominal cramps.  Regular contractions that start at 10 minutes apart and become stronger and more frequent with time.  Contractions that start on the top of the uterus and spread down to the lower abdomen and back.  Increased pelvic pressure and dull back pain.  A watery or bloody mucus discharge that comes from the vagina.  Leaking of amniotic fluid. This is also known as your "water breaking." It could be a slow trickle or a gush. Let your health care provider know if it has a color or strange odor. If you have any of these signs, call your health care provider right away, even if it is before your due date. Follow these instructions at home: Medicines  Follow your health care provider's instructions regarding medicine use. Specific medicines may be either safe or unsafe to take during pregnancy.  Take a prenatal vitamin that contains at least 600 micrograms (mcg) of folic acid.  If you develop constipation, try taking a stool softener if your health care provider approves. Eating and drinking  Eat a balanced diet that includes fresh fruits and vegetables, whole grains, good sources of protein  such as meat, eggs, or tofu, and low-fat dairy. Your health care provider will help you determine the amount of weight gain that is right for you.  Avoid raw meat and uncooked cheese. These carry germs that can cause birth defects in the baby.  If you have low calcium intake from food, talk to your health care provider about whether you should take a daily calcium supplement.  Eat four or five small meals rather than three large meals a day.  Limit foods that are high in fat and processed sugars, such as fried and sweet foods.  To prevent constipation: ? Drink enough fluid to keep your urine clear or pale yellow. ? Eat foods that are high in fiber, such as fresh fruits and vegetables, whole grains, and beans. Activity  Exercise only as directed by your health care provider. Most women can continue their usual exercise routine during pregnancy. Try to exercise for 30 minutes at least 5 days a week. Stop exercising if you experience uterine contractions.  Avoid heavy   lifting.  Do not exercise in extreme heat or humidity, or at high altitudes.  Wear low-heel, comfortable shoes.  Practice good posture.  You may continue to have sex unless your health care provider tells you otherwise. Relieving pain and discomfort  Take frequent breaks and rest with your legs elevated if you have leg cramps or low back pain.  Take warm sitz baths to soothe any pain or discomfort caused by hemorrhoids. Use hemorrhoid cream if your health care provider approves.  Wear a good support bra to prevent discomfort from breast tenderness.  If you develop varicose veins: ? Wear support pantyhose or compression stockings as told by your healthcare provider. ? Elevate your feet for 15 minutes, 3-4 times a day. Prenatal care  Write down your questions. Take them to your prenatal visits.  Keep all your prenatal visits as told by your health care provider. This is important. Safety  Wear your seat belt at  all times when driving.  Make a list of emergency phone numbers, including numbers for family, friends, the hospital, and police and fire departments. General instructions  Avoid cat litter boxes and soil used by cats. These carry germs that can cause birth defects in the baby. If you have a cat, ask someone to clean the litter box for you.  Do not travel far distances unless it is absolutely necessary and only with the approval of your health care provider.  Do not use hot tubs, steam rooms, or saunas.  Do not drink alcohol.  Do not use any products that contain nicotine or tobacco, such as cigarettes and e-cigarettes. If you need help quitting, ask your health care provider.  Do not use any medicinal herbs or unprescribed drugs. These chemicals affect the formation and growth of the baby.  Do not douche or use tampons or scented sanitary pads.  Do not cross your legs for long periods of time.  To prepare for the arrival of your baby: ? Take prenatal classes to understand, practice, and ask questions about labor and delivery. ? Make a trial run to the hospital. ? Visit the hospital and tour the maternity area. ? Arrange for maternity or paternity leave through employers. ? Arrange for family and friends to take care of pets while you are in the hospital. ? Purchase a rear-facing car seat and make sure you know how to install it in your car. ? Pack your hospital bag. ? Prepare the baby's nursery. Make sure to remove all pillows and stuffed animals from the baby's crib to prevent suffocation.  Visit your dentist if you have not gone during your pregnancy. Use a soft toothbrush to brush your teeth and be gentle when you floss. Contact a health care provider if:  You are unsure if you are in labor or if your water has broken.  You become dizzy.  You have mild pelvic cramps, pelvic pressure, or nagging pain in your abdominal area.  You have lower back pain.  You have persistent  nausea, vomiting, or diarrhea.  You have an unusual or bad smelling vaginal discharge.  You have pain when you urinate. Get help right away if:  Your water breaks before 37 weeks.  You have regular contractions less than 5 minutes apart before 37 weeks.  You have a fever.  You are leaking fluid from your vagina.  You have spotting or bleeding from your vagina.  You have severe abdominal pain or cramping.  You have rapid weight loss or weight gain.    You have shortness of breath with chest pain.  You notice sudden or extreme swelling of your face, hands, ankles, feet, or legs.  Your baby makes fewer than 10 movements in 2 hours.  You have severe headaches that do not go away when you take medicine.  You have vision changes. Summary  The third trimester is from week 28 through week 40, months 7 through 9. The third trimester is a time when the unborn baby (fetus) is growing rapidly.  During the third trimester, your discomfort may increase as you and your baby continue to gain weight. You may have abdominal, leg, and back pain, sleeping problems, and an increased need to urinate.  During the third trimester your breasts will keep growing and they will continue to become tender. A yellow fluid (colostrum) may leak from your breasts. This is the first milk you are producing for your baby.  False labor is a condition in which you feel small, irregular tightenings of the muscles in the womb (contractions) that eventually go away. These are called Braxton Hicks contractions. Contractions may last for hours, days, or even weeks before true labor sets in.  Signs of labor can include: abdominal cramps; regular contractions that start at 10 minutes apart and become stronger and more frequent with time; watery or bloody mucus discharge that comes from the vagina; increased pelvic pressure and dull back pain; and leaking of amniotic fluid. This information is not intended to replace advice  given to you by your health care provider. Make sure you discuss any questions you have with your health care provider. Document Released: 09/01/2001 Document Revised: 02/13/2016 Document Reviewed: 11/08/2012 Elsevier Interactive Patient Education  2017 [employer]  

## 2016-12-17 NOTE — Progress Notes (Signed)
ROB- glucola done, blood consent signed, tdap given, pt is doing well 

## 2016-12-17 NOTE — Progress Notes (Signed)
ROB & glucola-blood consent signed, Tdap, feeling worse anxiety at times, but denies depression, doesn't want to change medicine at this time. discussed some coping techniques.

## 2016-12-18 LAB — HEMOGLOBIN AND HEMATOCRIT, BLOOD
HEMATOCRIT: 28.5 % — AB (ref 34.0–46.6)
Hemoglobin: 9.2 g/dL — ABNORMAL LOW (ref 11.1–15.9)

## 2016-12-18 LAB — GLUCOSE, 1 HOUR GESTATIONAL: GESTATIONAL DIABETES SCREEN: 104 mg/dL (ref 65–139)

## 2016-12-22 ENCOUNTER — Encounter: Payer: Self-pay | Admitting: Obstetrics and Gynecology

## 2016-12-23 ENCOUNTER — Other Ambulatory Visit: Payer: Self-pay | Admitting: Obstetrics and Gynecology

## 2016-12-23 MED ORDER — FUSION PLUS PO CAPS: 1.0000 | ORAL_CAPSULE | Freq: Every day | ORAL | 1 refills | Status: DC

## 2016-12-28 ENCOUNTER — Encounter: Payer: Self-pay | Admitting: Obstetrics and Gynecology

## 2016-12-29 ENCOUNTER — Other Ambulatory Visit: Payer: Self-pay | Admitting: *Deleted

## 2016-12-29 MED ORDER — ALBUTEROL SULFATE HFA 108 (90 BASE) MCG/ACT IN AERS: 2.0000 | INHALATION_SPRAY | Freq: Four times a day (QID) | RESPIRATORY_TRACT | 2 refills | Status: DC | PRN

## 2016-12-30 ENCOUNTER — Encounter: Payer: Self-pay | Admitting: Obstetrics and Gynecology

## 2016-12-31 ENCOUNTER — Ambulatory Visit (INDEPENDENT_AMBULATORY_CARE_PROVIDER_SITE_OTHER): Admitting: Certified Nurse Midwife

## 2016-12-31 ENCOUNTER — Encounter: Payer: Self-pay | Admitting: Certified Nurse Midwife

## 2016-12-31 ENCOUNTER — Other Ambulatory Visit: Payer: Self-pay | Admitting: Certified Nurse Midwife

## 2016-12-31 VITALS — BP 103/62 | HR 94 | Wt 149.5 lb

## 2016-12-31 DIAGNOSIS — Z3492 Encounter for supervision of normal pregnancy, unspecified, second trimester: Secondary | ICD-10-CM

## 2016-12-31 DIAGNOSIS — R8299 Other abnormal findings in urine: Secondary | ICD-10-CM

## 2016-12-31 DIAGNOSIS — R82998 Other abnormal findings in urine: Secondary | ICD-10-CM

## 2016-12-31 LAB — POCT URINALYSIS DIPSTICK
Bilirubin, UA: NEGATIVE
Ketones, UA: NEGATIVE
NITRITE UA: NEGATIVE
PROTEIN UA: NEGATIVE
RBC UA: NEGATIVE
Spec Grav, UA: 1.01 (ref 1.010–1.025)
UROBILINOGEN UA: NEGATIVE U/dL — AB
pH, UA: 6 (ref 5.0–8.0)

## 2016-12-31 NOTE — Patient Instructions (Signed)

## 2017-01-02 LAB — URINE CULTURE

## 2017-01-03 NOTE — Progress Notes (Signed)
Stephanie Logan-Pt reports difficulty breathing, questions relation to baby position. Has not picked up inhaler from pharmacy. Lungs CTAB, Heart RRR, SpO2 98%. Encouraged visiting spinning babies website for optimization of fetal positioning techniques. Encouraged pick up inhaler and use as needed. Pt concerned about decreasing or changing anxiety medication at this time. Currently taking 60 mg Prozac daily, advised pt to continue taking medication as prescribed. Reviewed red flag symptoms and when to call. RTC x 2-3 weeks for Stephanie Logan.

## 2017-01-20 ENCOUNTER — Ambulatory Visit (INDEPENDENT_AMBULATORY_CARE_PROVIDER_SITE_OTHER): Admitting: Certified Nurse Midwife

## 2017-01-20 ENCOUNTER — Encounter: Payer: Self-pay | Admitting: Certified Nurse Midwife

## 2017-01-20 VITALS — BP 95/58 | HR 98 | Wt 149.6 lb

## 2017-01-20 DIAGNOSIS — Z3493 Encounter for supervision of normal pregnancy, unspecified, third trimester: Secondary | ICD-10-CM

## 2017-01-20 LAB — POCT URINALYSIS DIPSTICK
Bilirubin, UA: NEGATIVE
Blood, UA: NEGATIVE
Glucose, UA: NEGATIVE
LEUKOCYTES UA: NEGATIVE
Nitrite, UA: NEGATIVE
PH UA: 5 (ref 5.0–8.0)
PROTEIN UA: NEGATIVE
Spec Grav, UA: 1.01 (ref 1.010–1.025)
UROBILINOGEN UA: 0.2 U/dL

## 2017-01-20 NOTE — Patient Instructions (Signed)
Heartburn During Pregnancy Heartburn is a type of pain or discomfort that can happen in the throat or chest. It is often described as a burning sensation. Heartburn is common during pregnancy because:  A hormone (progesterone) that is released during pregnancy may relax the valve (lower esophageal sphincter, or LES) that separates the esophagus from the stomach. This allows stomach acid to move up into the esophagus, causing heartburn.  The uterus gets larger and pushes up on the stomach, which pushes more acid into the esophagus. This is especially true in the later stages of pregnancy. Heartburn usually goes away or gets better after giving birth. What are the causes? Heartburn is caused by stomach acid backing up into the esophagus (reflux). Reflux can be triggered by:  Changing hormone levels.  Large meals.  Certain foods and beverages, such as coffee, chocolate, onions, and peppermint.  Exercise.  Increased stomach acid production. What increases the risk? You are more likely to experience heartburn during pregnancy if you:  Had heartburn prior to becoming pregnant.  Have been pregnant more than once before.  Are overweight or obese. The likelihood that you will get heartburn also increases as you get farther along in your pregnancy, especially during the last trimester. What are the signs or symptoms? Symptoms of this condition include:  Burning pain in the chest or lower throat.  Bitter taste in the mouth.  Coughing.  Problems swallowing.  Vomiting.  Hoarse voice.  Asthma. Symptoms may get worse when you lie down or bend over. Symptoms are often worse at night. How is this diagnosed? This condition is diagnosed based on:  Your medical history.  Your symptoms.  Blood tests to check for a certain type of bacteria associated with heartburn.  Whether taking heartburn medicine relieves your symptoms.  Examination of the stomach and esophagus using a tube with  a light and camera on the end (endoscopy). How is this treated? Treatment varies depending on how severe your symptoms are. Your health care provider may recommend:  Over-the-counter medicines (antacids or acid reducers) for mild heartburn.  Prescription medicines to decrease stomach acid or to protect your stomach lining.  Certain changes in your diet.  Raising the head of your bed so it is higher than the foot of the bed. This helps prevent stomach acid from backing up into the esophagus when you are lying down. Follow these instructions at home: Eating and drinking  Do not drink alcohol during your pregnancy.  Identify foods and beverages that make your symptoms worse, and avoid them.  Beverages that you may want to avoid include:  Coffee and tea (with or without caffeine).  Energy drinks and sports drinks.  Carbonated drinks or sodas.  Citrus fruit juices.  Foods that you may want to avoid include:  Chocolate and cocoa.  Peppermint and mint flavorings.  Garlic, onions, and horseradish.  Spicy and acidic foods, including peppers, chili powder, curry powder, vinegar, hot sauces, and barbecue sauce.  Citrus fruits, such as oranges, lemons, and limes.  Tomato-based foods, such as red sauce, chili, and salsa.  Fried and fatty foods, such as donuts, french fries, potato chips, and high-fat dressings.  High-fat meats, such as hot dogs, cold cuts, sausage, ham, and bacon.  High-fat dairy items, such as whole milk, butter, and cheese.  Eat small, frequent meals instead of large meals.  Avoid drinking large amounts of liquid with your meals.  Avoid eating meals during the 2-3 hours before bedtime.  Avoid lying down right   after you eat.  Do not exercise right after you eat. Medicines  Take over-the-counter and prescription medicines only as told by your health care provider.  Do not take aspirin, ibuprofen, or other NSAIDs unless your health care provider tells  you to do that.  You may be instructed to avoid medicines that contain sodium bicarbonate. General instructions  If directed, raise the head of your bed about 6 inches (15 cm) by putting blocks under the legs. Sleeping with more pillows does not effectively relieve heartburn because it only changes the position of your head.  Do not use any products that contain nicotine or tobacco, such as cigarettes and e-cigarettes. If you need help quitting, ask your health care provider.  Wear loose-fitting clothing.  Try to reduce your stress, such as with yoga or meditation. If you need help managing stress, ask your health care provider.  Maintain a healthy weight. If you are overweight, work with your health care provider to safely lose weight.  Keep all follow-up visits as told by your health care provider. This is important. Contact a health care provider if:  You develop new symptoms.  Your symptoms do not improve with treatment.  You have unexplained weight loss.  You have difficulty swallowing.  You make loud sounds when you breathe (wheeze).  You have a cough that does not go away.  You have frequent heartburn for more than 2 weeks.  You have nausea or vomiting that does not get better with treatment.  You have pain in your abdomen. Get help right away if:  You have severe chest pain that spreads to your arm, neck, or jaw.  You feel sweaty, dizzy, or light-headed.  You have shortness of breath.  You have pain when swallowing.  You vomit, and your vomit looks like blood or coffee grounds.  Your stool is bloody or black. This information is not intended to replace advice given to you by your health care provider. Make sure you discuss any questions you have with your health care provider. Document Released: 09/04/2000 Document Revised: 05/25/2016 Document Reviewed: 05/25/2016 Elsevier Interactive Patient Education  2017 [employer]  

## 2017-01-20 NOTE — Progress Notes (Signed)
ROB, doing well. Continues to feel short of breath when lying down. She is currently using albuterol to help. She is pink, lungs clear, does not appear to be in destress.  She also has allergies that may be contributing to feeling short of breath. Discussed medications for allergies. Encouraged her to prop herself up with pillows while sleeping. She denies LOF, vaginal bleeding, and has occasional contractions. Endorses good fetal movement. PTL precautions reviewed. ROB in 2 wks.   Philip Aspen, CNM

## 2017-01-30 ENCOUNTER — Observation Stay (HOSPITAL_BASED_OUTPATIENT_CLINIC_OR_DEPARTMENT_OTHER)
Admission: EM | Admit: 2017-01-30 | Discharge: 2017-01-31 | Disposition: A | Discharge status: Home / Self Care | Source: Home / Self Care | Admitting: Obstetrics and Gynecology

## 2017-01-30 DIAGNOSIS — O26893 Other specified pregnancy related conditions, third trimester: Secondary | ICD-10-CM | POA: Diagnosis not present

## 2017-01-30 DIAGNOSIS — Z3490 Encounter for supervision of normal pregnancy, unspecified, unspecified trimester: Secondary | ICD-10-CM

## 2017-01-30 DIAGNOSIS — O479 False labor, unspecified: Secondary | ICD-10-CM

## 2017-01-30 DIAGNOSIS — Z3A34 34 weeks gestation of pregnancy: Secondary | ICD-10-CM | POA: Diagnosis not present

## 2017-01-30 DIAGNOSIS — R1010 Upper abdominal pain, unspecified: Secondary | ICD-10-CM | POA: Diagnosis not present

## 2017-01-30 LAB — URINALYSIS, ROUTINE W REFLEX MICROSCOPIC
Bilirubin Urine: NEGATIVE
GLUCOSE, UA: NEGATIVE mg/dL
Hgb urine dipstick: NEGATIVE
KETONES UR: 80 mg/dL — AB
Nitrite: NEGATIVE
PH: 6 (ref 5.0–8.0)
Protein, ur: 30 mg/dL — AB
SPECIFIC GRAVITY, URINE: 1.021 (ref 1.005–1.030)

## 2017-01-30 LAB — CBC WITH DIFFERENTIAL/PLATELET
BASOS PCT: 0 %
Basophils Absolute: 0 10*3/uL (ref 0–0.1)
Eosinophils Absolute: 0 10*3/uL (ref 0–0.7)
Eosinophils Relative: 0 %
HEMATOCRIT: 27.4 % — AB (ref 35.0–47.0)
HEMOGLOBIN: 8.8 g/dL — AB (ref 12.0–16.0)
LYMPHS ABS: 0.7 10*3/uL — AB (ref 1.0–3.6)
LYMPHS PCT: 6 %
MCH: 24.1 pg — ABNORMAL LOW (ref 26.0–34.0)
MCHC: 32 g/dL (ref 32.0–36.0)
MCV: 75.2 fL — ABNORMAL LOW (ref 80.0–100.0)
MONOS PCT: 7 %
Monocytes Absolute: 0.9 10*3/uL (ref 0.2–0.9)
NEUTROS ABS: 10.6 10*3/uL — AB (ref 1.4–6.5)
NEUTROS PCT: 87 %
Platelets: 224 10*3/uL (ref 150–440)
RBC: 3.65 MIL/uL — ABNORMAL LOW (ref 3.80–5.20)
RDW: 15 % — ABNORMAL HIGH (ref 11.5–14.5)
WBC: 12.3 10*3/uL — ABNORMAL HIGH (ref 3.6–11.0)

## 2017-01-30 MED ORDER — FENTANYL CITRATE (PF) 100 MCG/2ML IJ SOLN
50.0000 ug | INTRAMUSCULAR | Status: DC | PRN
  Administered 2017-01-30: 50 ug via INTRAVENOUS

## 2017-01-30 MED ORDER — FENTANYL CITRATE (PF) 100 MCG/2ML IJ SOLN
INTRAMUSCULAR | Status: AC
  Administered 2017-01-30: 50 ug via INTRAVENOUS
  Filled 2017-01-30: qty 2

## 2017-01-30 MED ORDER — TERBUTALINE SULFATE 1 MG/ML IJ SOLN
0.2500 mg | Freq: Once | INTRAMUSCULAR | Status: AC
  Administered 2017-01-30: 0.25 mg via SUBCUTANEOUS
  Filled 2017-01-30: qty 1

## 2017-01-30 MED ORDER — SODIUM CHLORIDE 0.9 % IV SOLN
INTRAVENOUS | Status: AC
  Administered 2017-01-30: 2 g via INTRAVENOUS
  Filled 2017-01-30: qty 2000

## 2017-01-30 MED ORDER — LACTATED RINGERS IV SOLN
INTRAVENOUS | Status: DC
  Administered 2017-01-30 – 2017-01-31 (×2): via INTRAVENOUS

## 2017-01-30 MED ORDER — SODIUM CHLORIDE 0.9 % IV SOLN
INTRAVENOUS | Status: AC
  Filled 2017-01-30: qty 2000

## 2017-01-30 MED ORDER — TERBUTALINE SULFATE 1 MG/ML IJ SOLN
0.2500 mg | Freq: Once | INTRAMUSCULAR | Status: AC
  Administered 2017-01-30: 0.25 mg via SUBCUTANEOUS

## 2017-01-30 MED ORDER — BETAMETHASONE SOD PHOS & ACET 6 (3-3) MG/ML IJ SUSP
INTRAMUSCULAR | Status: AC
  Filled 2017-01-30: qty 1

## 2017-01-30 MED ORDER — BETAMETHASONE SOD PHOS & ACET 6 (3-3) MG/ML IJ SUSP
12.0000 mg | INTRAMUSCULAR | Status: DC
  Administered 2017-01-30: 12 mg via INTRAMUSCULAR

## 2017-01-30 MED ORDER — ACETAMINOPHEN 325 MG PO TABS
650.0000 mg | ORAL_TABLET | Freq: Four times a day (QID) | ORAL | Status: DC | PRN
  Administered 2017-01-30 – 2017-01-31 (×2): 650 mg via ORAL
  Filled 2017-01-30 (×2): qty 2

## 2017-01-30 MED ORDER — ONDANSETRON HCL 4 MG/2ML IJ SOLN
4.0000 mg | Freq: Four times a day (QID) | INTRAMUSCULAR | Status: DC | PRN
  Administered 2017-01-30: 4 mg via INTRAVENOUS
  Filled 2017-01-30: qty 2

## 2017-01-30 MED ORDER — BETAMETHASONE SOD PHOS & ACET 6 (3-3) MG/ML IJ SUSP
INTRAMUSCULAR | Status: AC
  Administered 2017-01-30: 12 mg via INTRAMUSCULAR
  Filled 2017-01-30: qty 1

## 2017-01-30 MED ORDER — SODIUM CHLORIDE 0.9 % IV SOLN
2.0000 g | Freq: Once | INTRAVENOUS | Status: AC
  Administered 2017-01-30: 2 g via INTRAVENOUS

## 2017-01-30 MED ORDER — FLUOXETINE HCL 20 MG PO CAPS
60.0000 mg | ORAL_CAPSULE | Freq: Every day | ORAL | Status: DC
  Administered 2017-01-31: 60 mg via ORAL
  Filled 2017-01-30 (×2): qty 3

## 2017-01-30 NOTE — OB Triage Provider Note (Signed)
Stephanie Logan is a 26 y.o. [phone] at [redacted]w[redacted]d who is admitted for upper abdominal pain since last night, and then contractions this afternoon.  Estimated Date of Delivery: 03/13/17 Fetal presentation is cephalic.  Length of Stay:  0 Days. Admitted 01/30/2017  Subjective: Also states she feels achy all over, and has nasal stuffiness since last night.  Patient reports good fetal movement.  She reports regular uterine contractions, no bleeding and no loss of fluid per vagina.  Vitals:  Blood pressure 106/65, pulse (!) 128, temperature 97.6 F (36.4 C), temperature source Oral, last menstrual period 04/22/2016. Physical Examination: CONSTITUTIONAL: Well-developed, well-nourished female in no acute distress.  SKIN: Skin is warm and dry. No rash noted. Not diaphoretic. No erythema. No pallor. [place]: Alert and oriented to person, place, and time. Normal reflexes, muscle tone coordination. No cranial nerve deficit noted. PSYCHIATRIC: Normal mood and affect. Normal behavior. Normal judgment and thought content. CARDIOVASCULAR: Normal heart rate noted, regular rhythm RESPIRATORY: Effort and breath sounds normal, no problems with respiration noted MUSCULOSKELETAL: Normal range of motion. No edema and no tenderness. 2+ distal pulses. ABDOMEN: Soft, nontender, nondistended, gravid. CERVIX: Dilation: 1 Effacement (%): 20 Cervical Position: Posterior Exam by:: ER RN   Fetal monitoring: FHR: 158 bpm, Variability: moderate, Accelerations: Present, Decelerations: Absent  Uterine activity: 20 contractions per hour  No results found for this or any previous visit (from the past 48 hour(s)).  No results found.  Current scheduled medications   I have reviewed the patient's current medications.  ASSESSMENT: Patient Active Problem List   Diagnosis Date Noted  . Vaginal bleeding in pregnancy, second trimester 12/01/2016  . Dysmenorrhea 06/18/2016  . Chronic pain of right hip 02/18/2016  .  Chronic pain of right knee 02/18/2016  . Depression with anxiety 02/18/2016  . Pigmented skin lesion 02/18/2016  . Severe episode of recurrent major depressive disorder, without psychotic features ([place])   . PCOS (polycystic ovarian syndrome) 07/19/2015    PLAN: IV hydration and IV zofran, UA sent - awaiting results, and will send to culture if indicated. continuous fetal monitoring, will consider terbutaline if contractions do not resolve with IV hydration.   [place], CNM [facility]

## 2017-01-30 NOTE — Progress Notes (Addendum)
Spoke with [employer] via phone with patient update/lab results. Verbal orders given to repeat dose of terbutaline .25mg  as needed for up to 4 doses and Ampicillin 2g IVPB. Verbal order for BMZ and to send GBS culture. Verbal order to keep patient overnight. Will continue to update provider.

## 2017-01-30 NOTE — OB Triage Note (Signed)
Patient presents to triage complains of abdominal pain under ribs, and contractions unsure of frequency due to pain under ribs, with contractions pain in more in her back. No leaking of fluid, no bleeding.

## 2017-01-31 ENCOUNTER — Inpatient Hospital Stay: Admission: RE | Admit: 2017-01-31 | Source: Home / Self Care | Admitting: Obstetrics and Gynecology

## 2017-01-31 ENCOUNTER — Inpatient Hospital Stay
Admission: EM | Admit: 2017-01-31 | Discharge: 2017-01-31 | DRG: 778 | Disposition: A | Discharge status: Home / Self Care | Attending: Obstetrics and Gynecology | Admitting: Obstetrics and Gynecology

## 2017-01-31 DIAGNOSIS — O26893 Other specified pregnancy related conditions, third trimester: Secondary | ICD-10-CM | POA: Diagnosis present

## 2017-01-31 DIAGNOSIS — O2343 Unspecified infection of urinary tract in pregnancy, third trimester: Secondary | ICD-10-CM | POA: Diagnosis present

## 2017-01-31 DIAGNOSIS — Z3A34 34 weeks gestation of pregnancy: Secondary | ICD-10-CM

## 2017-01-31 DIAGNOSIS — E860 Dehydration: Secondary | ICD-10-CM | POA: Diagnosis present

## 2017-01-31 DIAGNOSIS — R1010 Upper abdominal pain, unspecified: Secondary | ICD-10-CM | POA: Diagnosis not present

## 2017-01-31 MED ORDER — AMOXICILLIN 500 MG PO CAPS: 500.0000 mg | ORAL_CAPSULE | Freq: Three times a day (TID) | ORAL | 2 refills | Status: DC

## 2017-01-31 MED ORDER — BETAMETHASONE SOD PHOS & ACET 6 (3-3) MG/ML IJ SUSP
12.0000 mg | Freq: Once | INTRAMUSCULAR | Status: AC
  Administered 2017-01-31: 6 mg via INTRAMUSCULAR

## 2017-01-31 NOTE — Discharge Summary (Signed)
Antenatal Physician Discharge Summary  Patient ID: Stephanie Logan MRN: [id number] DOB/AGE: January 06, 1991 26 y.o.  Admit date: 01/30/2017 Discharge date: 01/31/2017  Admission Diagnoses:  Discharge Diagnoses:   Prenatal Procedures: NST and IV fluids, antibiotics  Consults: Neonatology, Maternal Fetal Medicine  Significant Diagnostic Studies:  Results for orders placed or performed during the hospital encounter of 01/30/17 (from the past 168 hour(s))  CBC with Differential/Platelet   Collection Time: 01/30/17  8:11 PM  Result Value Ref Range   WBC 12.3 (H) 3.6 - 11.0 K/uL   RBC 3.65 (L) 3.80 - 5.20 MIL/uL   Hemoglobin 8.8 (L) 12.0 - 16.0 g/dL   HCT 27.4 (L) 35.0 - 47.0 %   MCV 75.2 (L) 80.0 - 100.0 fL   MCH 24.1 (L) 26.0 - 34.0 pg   MCHC 32.0 32.0 - 36.0 g/dL   RDW 15.0 (H) 11.5 - 14.5 %   Platelets 224 150 - 440 K/uL   Neutrophils Relative % 87 %   Neutro Abs 10.6 (H) 1.4 - 6.5 K/uL   Lymphocytes Relative 6 %   Lymphs Abs 0.7 (L) 1.0 - 3.6 K/uL   Monocytes Relative 7 %   Monocytes Absolute 0.9 0.2 - 0.9 K/uL   Eosinophils Relative 0 %   Eosinophils Absolute 0.0 0 - 0.7 K/uL   Basophils Relative 0 %   Basophils Absolute 0.0 0 - 0.1 K/uL  Urinalysis, Routine w reflex microscopic   Collection Time: 01/30/17  8:53 PM  Result Value Ref Range   Color, Urine YELLOW (A) YELLOW   APPearance CLOUDY (A) CLEAR   Specific Gravity, Urine 1.021 1.005 - 1.030   pH 6.0 5.0 - 8.0   Glucose, UA NEGATIVE NEGATIVE mg/dL   Hgb urine dipstick NEGATIVE NEGATIVE   Bilirubin Urine NEGATIVE NEGATIVE   Ketones, ur 80 (A) NEGATIVE mg/dL   Protein, ur 30 (A) NEGATIVE mg/dL   Nitrite NEGATIVE NEGATIVE   Leukocytes, UA MODERATE (A) NEGATIVE   RBC / HPF 0-5 0 - 5 RBC/hpf   WBC, UA 6-30 0 - 5 WBC/hpf   Bacteria, UA RARE (A) NONE SEEN   Squamous Epithelial / LPF 6-30 (A) NONE SEEN   Mucous PRESENT     Treatments: IV hydration, antibiotics: ampicillin and tocolytics  Hospital Course:  This is  a 26 y.o. [id number] with IUP at [redacted]w[redacted]d admitted for preterm contractions, dehydration, and UTI. She was admitted with contractions, noted to have a cervical exam of 1/20/OOP.  No leaking of fluid and no bleeding.  She was initially started on magnesium sulfate for tocolysis and neuroprotection and also received betamethasone x 2 doses.  Her tocolysis was transitioned to Procardia. She was seen by Neonatology during her stay.  She was observed, fetal heart rate monitoring remained reassuring, and she had no signs/symptoms of progressing preterm labor or other maternal-fetal concerns.  Her cervical exam was unchanged from admission.  She was deemed stable for discharge to home with outpatient follow up.  Discharge Exam: BP (!) 100/44 (BP Location: Left Arm)   Pulse (!) 130   Temp 98 F (36.7 C) (Oral)   Resp 18   LMP 04/22/2016 (LMP Unknown)   SpO2 100%  General appearance: alert, cooperative and appears stated age GI: soft not-tender and gravid  Discharge Condition: Stable  Disposition: 01-Home or Self Care to stay on pelvic rest until futher notice, light activity, continue antibiotics until urine culture comes back. To return at 10pm tonight to get second dose Betamethasone. Has next OB  appointment on Wed. With me.      [place]

## 2017-02-02 LAB — CULTURE, BETA STREP (GROUP B ONLY)

## 2017-02-03 ENCOUNTER — Ambulatory Visit (INDEPENDENT_AMBULATORY_CARE_PROVIDER_SITE_OTHER): Admitting: Obstetrics and Gynecology

## 2017-02-03 VITALS — BP 101/63 | HR 88 | Wt 148.4 lb

## 2017-02-03 DIAGNOSIS — Z3493 Encounter for supervision of normal pregnancy, unspecified, third trimester: Secondary | ICD-10-CM

## 2017-02-03 LAB — POCT URINALYSIS DIPSTICK
BILIRUBIN UA: NEGATIVE
Blood, UA: NEGATIVE
GLUCOSE UA: NEGATIVE
Ketones, UA: NEGATIVE
Nitrite, UA: NEGATIVE
Spec Grav, UA: 1.01 (ref 1.010–1.025)
Urobilinogen, UA: 0.2 E.U./dL
pH, UA: 7 (ref 5.0–8.0)

## 2017-02-03 NOTE — Progress Notes (Signed)
ROB- doing better, irregular but persistent contractions, mild and doesn't keep her awake. To remain on pelvic rest. Will repeat growth scan next visit due to placental infarct and insufficiency with first pregnancy.

## 2017-02-03 NOTE — Progress Notes (Signed)
ROB- pt was in hospital this weekend, pelvic pressure- nasal congestion, sinus pressure, coughing- productive

## 2017-02-05 ENCOUNTER — Other Ambulatory Visit: Payer: Self-pay | Admitting: Obstetrics and Gynecology

## 2017-02-05 ENCOUNTER — Ambulatory Visit (INDEPENDENT_AMBULATORY_CARE_PROVIDER_SITE_OTHER): Admitting: Obstetrics and Gynecology

## 2017-02-05 ENCOUNTER — Other Ambulatory Visit (INDEPENDENT_AMBULATORY_CARE_PROVIDER_SITE_OTHER)

## 2017-02-05 VITALS — BP 101/68 | HR 86 | Wt 146.2 lb

## 2017-02-05 DIAGNOSIS — O368120 Decreased fetal movements, second trimester, not applicable or unspecified: Secondary | ICD-10-CM

## 2017-02-05 DIAGNOSIS — Z3493 Encounter for supervision of normal pregnancy, unspecified, third trimester: Secondary | ICD-10-CM

## 2017-02-05 DIAGNOSIS — O403XX0 Polyhydramnios, third trimester, not applicable or unspecified: Secondary | ICD-10-CM

## 2017-02-05 DIAGNOSIS — N2889 Other specified disorders of kidney and ureter: Secondary | ICD-10-CM

## 2017-02-05 LAB — POCT URINALYSIS DIPSTICK
Bilirubin, UA: NEGATIVE
Blood, UA: NEGATIVE
GLUCOSE UA: NEGATIVE
KETONES UA: NEGATIVE
LEUKOCYTES UA: NEGATIVE
Nitrite, UA: NEGATIVE
PROTEIN UA: NEGATIVE
Spec Grav, UA: 1.01 (ref 1.010–1.025)
Urobilinogen, UA: 0.2 E.U./dL
pH, UA: 7 (ref 5.0–8.0)

## 2017-02-05 NOTE — Patient Instructions (Signed)
Esophageal atresia   Esophageal atresia is a disorder of the digestive system in which the esophagus does not develop properly. The esophagus is the tube that normally carries food from the mouth to the stomach. 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 This means it occurs before birth. There are several types. In most cases, the upper esophagus ends and does not connect with the lower esophagus and stomach.  Most infants with EA have another defect called tracheoesophageal fistula (TEF). This is an abnormal connection between the esophagus and the windpipe (trachea). In addition, infants with EA/TEF often have tracheomalacia. This is a weakness and floppiness of the walls of the windpipe, which can cause breathing to sound high-pitched or noisy. Some babies with EA/TEF have other defects as well, most commonly heart defects. Symptoms Symptoms of EA may include: Bluish coloration to the skin (cyanosis) with attempted feeding  Coughing, gagging, and choking with attempted feeding  Drooling  Poor feeding Exams and Tests Before birth, a mother's ultrasound may show too much amniotic fluid. This can be a sign of EA or other blockage of the baby's digestive tract.  The disorder is usually detected shortly after birth when the infant tries to feed and then coughs, chokes, and turns blue. If EA is suspected, the health care provider will try to pass a small feeding tube through the infant's mouth or nose into the stomach. If the feeding tube can't pass all the way to the stomach, the infant will likely be diagnosed with EA. An x-ray is then done and will show any of the following: An air-filled pouch in the esophagus.  Air in the stomach and intestine.  If a feeding tube has been inserted before the x-ray, it will appear coiled in the upper esophagus. Treatment EA is a surgical emergency. Surgery to repair the esophagus is done as soon as possible after birth so that the lungs are not  damaged and the baby can be fed. Before the surgery, the baby is not fed by mouth and will need intravenous (IV) nutrition. Care is taken to prevent the baby from breathing secretions into the lungs. Outlook (Prognosis) An early diagnosis gives a better chance of a good outcome. Possible Complications The infant may breathe saliva and other fluids into the lungs, causing aspiration pneumonia, choking, and possibly death. Other complications may include: Feeding problems  Reflux (the repeated bringing up of food from the stomach) after surgery  Narrowing (stricture) of the esophagus due to scarring from surgery Prematurity may complicate the condition. As noted above, there may also be defects in other areas of the body.

## 2017-02-05 NOTE — Progress Notes (Signed)
101 68  .OB WORK IN- pt hasnt felt baby move, was so busy yesterday didn't feel her

## 2017-02-05 NOTE — Progress Notes (Signed)
Work in [employer] for decreased fetal mov't-  Indications: BPP w/Non-Stress Test Findings:  Singleton intrauterine pregnancy is visualized with FHR at 155 BPM. Biometrics give an (U/S) Gestational age of [redacted] weeks 3 days, and an (U/S) EDD of 03/23/17; this correlates with the clinically established EDD of 03/13/17.  Fetal presentation is vertex.  EFW: 2170 grams ( 4 lbs. 13 oz) 24th percentile for growth, Williams. Placenta: Anterior, grade 2. AFI: POLYHYDRAMNIOS at 28.4 cm (greater than the 97.5 percentile for 35 weeks which is 27.9 cm)  Anatomic survey of the fetal kidneys and bladder is seen and appear WNL. Fetal stomach is NOT SEEN during the entire 45 minute scan.  Fetal right renal pelvis is dilated at 9.4 mm. Gender - Female .   BPP: 4/8.  (No fetal breathing movements seen and POLYHYDRAMNIOS.)  Fetal umbilical dopplers: S/D ratio is 2.65 which is between the 50th and 95th percentile for 35 weeks (50th= 2.56 and 95th=3.45).   Impression: 1. 33 week 3 day Viable Singleton Intrauterine pregnancy by U/S. 2. (U/S) EDD is consistent with Clinically established (LMP) EDD of 03/13/17. 3. BPP: 4/8 for no fetal breathing movements and Polyhydramnios 4. Umbilical artery dopplers appear WNL. 5. Right fetal renal pelviectasis at 9.4 mm ( > 7 mm in 3rd trimester is considered abnormal.). 6. Fetal stomach is not seen during entire BPP and doppler scan. 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  Counseled at length about findings and concerns. Sent referral to [facility] for next available. Instructed to do [place] bid and return to L&D for any concerns. Also scheduled twice weekly NST with repeat u/s here in one week.

## 2017-02-07 ENCOUNTER — Encounter: Payer: Self-pay | Admitting: Anesthesiology

## 2017-02-07 ENCOUNTER — Inpatient Hospital Stay
Admission: EM | Admit: 2017-02-07 | Discharge: 2017-02-08 | DRG: 775 | Disposition: A | Discharge status: Home / Self Care | Source: Ambulatory Visit | Attending: Obstetrics and Gynecology | Admitting: Obstetrics and Gynecology

## 2017-02-07 ENCOUNTER — Inpatient Hospital Stay: Admit: 2017-02-07

## 2017-02-07 ENCOUNTER — Inpatient Hospital Stay: Admitting: Anesthesiology

## 2017-02-07 DIAGNOSIS — Z3A35 35 weeks gestation of pregnancy: Secondary | ICD-10-CM

## 2017-02-07 DIAGNOSIS — O364XX0 Maternal care for intrauterine death, not applicable or unspecified: Secondary | ICD-10-CM | POA: Diagnosis present

## 2017-02-07 DIAGNOSIS — Z8759 Personal history of other complications of pregnancy, childbirth and the puerperium: Secondary | ICD-10-CM

## 2017-02-07 LAB — CBC
HCT: 30 % — ABNORMAL LOW (ref 35.0–47.0)
HEMOGLOBIN: 9.5 g/dL — AB (ref 12.0–16.0)
MCH: 23.2 pg — AB (ref 26.0–34.0)
MCHC: 31.7 g/dL — ABNORMAL LOW (ref 32.0–36.0)
MCV: 73.2 fL — ABNORMAL LOW (ref 80.0–100.0)
Platelets: 312 10*3/uL (ref 150–440)
RBC: 4.1 MIL/uL (ref 3.80–5.20)
RDW: 15.3 % — ABNORMAL HIGH (ref 11.5–14.5)
WBC: 12 10*3/uL — ABNORMAL HIGH (ref 3.6–11.0)

## 2017-02-07 MED ORDER — OXYCODONE-ACETAMINOPHEN 5-325 MG PO TABS: 2.0000 | ORAL_TABLET | ORAL | Status: DC | PRN

## 2017-02-07 MED ORDER — BUPIVACAINE HCL (PF) 0.25 % IJ SOLN
INTRAMUSCULAR | Status: DC | PRN
Start: 2017-02-07 — End: 2017-02-08
  Administered 2017-02-07: 10 mL via EPIDURAL

## 2017-02-07 MED ORDER — LIDOCAINE-EPINEPHRINE (PF) 1.5 %-1:200000 IJ SOLN
INTRAMUSCULAR | Status: DC | PRN
  Administered 2017-02-07: 3 mL via PERINEURAL

## 2017-02-07 MED ORDER — OXYTOCIN 40 UNITS IN LACTATED RINGERS INFUSION - SIMPLE MED: 1.0000 m[IU]/min | INTRAVENOUS | Status: DC

## 2017-02-07 MED ORDER — FENTANYL CITRATE (PF) 100 MCG/2ML IJ SOLN
50.0000 ug | INTRAMUSCULAR | Status: DC | PRN
  Administered 2017-02-07 (×2): 50 ug via INTRAVENOUS
  Filled 2017-02-07: qty 2

## 2017-02-07 MED ORDER — ACETAMINOPHEN 325 MG PO TABS
650.0000 mg | ORAL_TABLET | ORAL | Status: DC | PRN
  Filled 2017-02-07: qty 2

## 2017-02-07 MED ORDER — OXYCODONE-ACETAMINOPHEN 5-325 MG PO TABS
2.0000 | ORAL_TABLET | ORAL | Status: DC | PRN
Start: 2017-02-07 — End: 2017-02-08
  Filled 2017-02-07: qty 2

## 2017-02-07 MED ORDER — LIDOCAINE HCL (PF) 1 % IJ SOLN
INTRAMUSCULAR | Status: DC | PRN
  Administered 2017-02-07: 3 mL

## 2017-02-07 MED ORDER — MISOPROSTOL 200 MCG PO TABS: 200.0000 ug | ORAL_TABLET | ORAL | Status: DC | PRN

## 2017-02-07 MED ORDER — LACTATED RINGERS IV SOLN
INTRAVENOUS | Status: DC
  Administered 2017-02-07: 18:00:00 via INTRAVENOUS

## 2017-02-07 MED ORDER — TERBUTALINE SULFATE 1 MG/ML IJ SOLN
INTRAMUSCULAR | Status: AC
  Filled 2017-02-07: qty 1

## 2017-02-07 MED ORDER — FENTANYL 2.5 MCG/ML W/ROPIVACAINE 0.2% IN NS 100 ML EPIDURAL INFUSION (ARMC-ANES)
EPIDURAL | Status: AC
  Filled 2017-02-07: qty 100

## 2017-02-07 MED ORDER — OXYCODONE-ACETAMINOPHEN 5-325 MG PO TABS: 1.0000 | ORAL_TABLET | ORAL | Status: DC | PRN

## 2017-02-07 MED ORDER — LIDOCAINE HCL (PF) 1 % IJ SOLN: 30.0000 mL | INTRAMUSCULAR | Status: DC | PRN

## 2017-02-07 MED ORDER — OXYTOCIN BOLUS FROM INFUSION: 500.0000 mL | Freq: Once | INTRAVENOUS | Status: DC

## 2017-02-07 MED ORDER — SOD CITRATE-CITRIC ACID 500-334 MG/5ML PO SOLN: 30.0000 mL | ORAL | Status: DC | PRN

## 2017-02-07 MED ORDER — LACTATED RINGERS IV SOLN: INTRAVENOUS | Status: DC

## 2017-02-07 MED ORDER — OXYTOCIN BOLUS FROM INFUSION
500.0000 mL | Freq: Once | INTRAVENOUS | Status: AC
  Administered 2017-02-08: 500 mL via INTRAVENOUS

## 2017-02-07 MED ORDER — LIDOCAINE HCL (PF) 1 % IJ SOLN
30.0000 mL | INTRAMUSCULAR | Status: DC | PRN
Start: 2017-02-07 — End: 2017-02-07

## 2017-02-07 MED ORDER — LACTATED RINGERS IV SOLN
500.0000 mL | INTRAVENOUS | Status: DC | PRN
  Administered 2017-02-07: 300 mL via INTRAVENOUS
  Administered 2017-02-07: 1000 mL via INTRAVENOUS

## 2017-02-07 MED ORDER — FENTANYL CITRATE (PF) 100 MCG/2ML IJ SOLN: 50.0000 ug | INTRAMUSCULAR | Status: DC | PRN

## 2017-02-07 MED ORDER — MISOPROSTOL 200 MCG PO TABS
ORAL_TABLET | ORAL | Status: AC
  Administered 2017-02-07: 200 ug via BUCCAL
  Filled 2017-02-07: qty 1

## 2017-02-07 MED ORDER — ACETAMINOPHEN 325 MG PO TABS: 650.0000 mg | ORAL_TABLET | ORAL | Status: DC | PRN

## 2017-02-07 MED ORDER — OXYTOCIN 40 UNITS IN LACTATED RINGERS INFUSION - SIMPLE MED: 2.5000 [IU]/h | INTRAVENOUS | Status: DC

## 2017-02-07 MED ORDER — BUTORPHANOL TARTRATE 2 MG/ML IJ SOLN
1.0000 mg | Freq: Once | INTRAMUSCULAR | Status: AC
  Administered 2017-02-07: 1 mg via INTRAVENOUS
  Filled 2017-02-07: qty 2

## 2017-02-07 MED ORDER — CLONAZEPAM 0.5 MG PO TABS
1.0000 mg | ORAL_TABLET | Freq: Three times a day (TID) | ORAL | Status: DC | PRN
  Administered 2017-02-07 – 2017-02-08 (×4): 1 mg via ORAL
  Filled 2017-02-07 (×4): qty 2

## 2017-02-07 MED ORDER — ONDANSETRON HCL 4 MG/2ML IJ SOLN: 4.0000 mg | Freq: Four times a day (QID) | INTRAMUSCULAR | Status: DC | PRN

## 2017-02-07 MED ORDER — LACTATED RINGERS IV SOLN: 500.0000 mL | INTRAVENOUS | Status: DC | PRN

## 2017-02-07 MED ORDER — OXYCODONE-ACETAMINOPHEN 5-325 MG PO TABS
1.0000 | ORAL_TABLET | ORAL | Status: DC | PRN
  Administered 2017-02-08 (×3): 1 via ORAL
  Filled 2017-02-07 (×2): qty 1

## 2017-02-07 MED ORDER — ONDANSETRON HCL 4 MG/2ML IJ SOLN
4.0000 mg | Freq: Four times a day (QID) | INTRAMUSCULAR | Status: DC | PRN
Start: 2017-02-07 — End: 2017-02-07

## 2017-02-07 MED ORDER — OXYTOCIN 40 UNITS IN LACTATED RINGERS INFUSION - SIMPLE MED
1.0000 m[IU]/min | INTRAVENOUS | Status: DC
Start: 2017-02-07 — End: 2017-02-07

## 2017-02-07 MED ORDER — TERBUTALINE SULFATE 1 MG/ML IJ SOLN: 0.2500 mg | INTRAMUSCULAR | Status: DC | PRN

## 2017-02-07 NOTE — H&P (Signed)
Obstetric History and Physical  Stephanie Logan is a 26 y.o. [phone] with IUP at [redacted]w[redacted]d presenting with decreased fetal movement x 1 day, and in general not feeling well. Patient states she has been having  none contractions, none vaginal bleeding, intact membranes, with decreased  fetal movement.    Prenatal Course Source of Care: [facility]  Pregnancy complications or risks:polyhydramnious, renal stasis, and preterm labor  Prenatal labs and studies: ABO, Rh: O/Positive/-- (11/13 1043) Antibody: Negative (11/13 1043) Rubella: 2.05 (11/13 1043) RPR: Non Reactive (11/13 1043)  HBsAg: Negative (11/13 1043)  HIV: Non Reactive (11/13 1043)  GBS: negative 1 hr Glucola  normal Genetic screening normal Anatomy US abnormal with polyhydramnious and renal stasi on scan this week- new findings.  Past Medical History:  Diagnosis Date  . Amenorrhea   . Anxiety   . PCOS (polycystic ovarian syndrome)   . Post partum depression     Past Surgical History:  Procedure Laterality Date  . BREAST FIBROADENOMA SURGERY  2008,2010    OB History  Gravida Para Term Preterm AB Living  4 0 0 0 1 2  SAB TAB Ectopic Multiple Live Births  1 0 0 0 2    # Outcome Date GA Lbr Len/2nd Weight Sex Delivery Anes PTL Lv  4 Current           3 Gravida 01/27/15    M Vag-Spont   LIV  2 SAB 01/2014        ND  1 Gravida 01/19/13    M Vag-Spont   LIV     Complications: Anal sphincter tear in delivery without 3rd degree perineal laceration      Social History   Social History  . Marital status: Married    Spouse name: N/A  . Number of children: N/A  . Years of education: N/A   Social History Main Topics  . Smoking status: Never Smoker  . Smokeless tobacco: Never Used  . Alcohol use Yes     Comment: occas  . Drug use: No  . Sexual activity: Yes    Birth control/ protection: None   Other Topics Concern  . None   Social History Narrative  . None    Family History  Problem Relation Age of Onset  .  Cancer Maternal Grandmother        brain  . Cancer Mother        cervical, precancerous  . Seizures Sister     Prescriptions Prior to Admission  Medication Sig Dispense Refill Last Dose  . amoxicillin (AMOXIL) 500 MG capsule Take 1 capsule (500 mg total) by mouth 3 (three) times daily. 21 capsule 2 02/07/2017 at Unknown time  . FLUoxetine (PROZAC) 20 MG capsule Take 1 capsule (20 mg total) by mouth daily. 90 capsule 3 02/06/2017 at Unknown time  . Iron-FA-B Cmp-C-Biot-Probiotic (FUSION PLUS) CAPS Take 1 capsule by mouth daily. 60 capsule 1 02/06/2017 at Unknown time  . Prenatal Vit-Fe Fumarate-FA (MULTIVITAMIN-PRENATAL) 27-0.8 MG TABS tablet Take 1 tablet by mouth daily at 12 noon.   02/07/2017 at Unknown time  . butalbital-acetaminophen-caffeine (FIORICET, ESGIC) 50-325-40 MG tablet TK 1 TO 2 TS PO Q 6 H PRF HA  3 Completed Course at Unknown time  . FLUoxetine (PROZAC) 40 MG capsule Take 1 capsule (40 mg total) by mouth daily. 90 capsule 1 Taking    Allergies  Allergen Reactions  . Ciprofloxacin Rash    Review of Systems: Negative except for what is mentioned in  HPI.  Physical Exam: BP 109/70   Pulse (!) 121   Temp 98.1 F (36.7 C) (Tympanic)   LMP 04/22/2016 (LMP Unknown)   SpO2 100%  GENERAL: Well-developed, well-nourished female in no acute distress.  LUNGS: Clear to auscultation bilaterally.  HEART: Regular rate and rhythm. ABDOMEN: Soft, nontender, nondistended, gravid. EXTREMITIES: Nontender, no edema, 2+ distal pulses. Cervical Exam:  not done yet FHT:  Absent, confirmed with ultrasound by myself and Dr Glennon Mac at bedside Contractions: none   Pertinent Labs/Studies:   No results found for this or any previous visit (from the past 24 hour(s)).  Assessment : Stephanie Logan is a 28 y.o. [id number] at [redacted]w[redacted]d being admitted for labor with IUFD.  Plan: Labor: Augmentation per protocol FWB: Reassuring fetal heart tracing.  GBS negative Delivery plan: Hopeful for vaginal  delivery  Chris Narasimhan, CNM [facility]

## 2017-02-07 NOTE — Anesthesia Procedure Notes (Addendum)
Epidural Patient location during procedure: OB Start time: 02/07/2017 6:50 PM End time: 02/07/2017 6:57 PM  Preanesthetic Checklist Completed: patient identified, site marked, surgical consent, pre-op evaluation, timeout performed, IV checked, risks and benefits discussed and monitors and equipment checked  Epidural Patient position: sitting Prep: Betadine Patient monitoring: heart rate, continuous pulse ox and blood pressure Approach: midline Location: L4-L5 Injection technique: LOR air  Needle:  Needle type: Tuohy  Needle gauge: 18 G Needle length: 9 cm and 9 Catheter type: closed end flexible Catheter size: 20 Guage Test dose: negative and 1.5% lidocaine with Epi 1:200 K  Assessment Events: blood not aspirated, injection not painful, no injection resistance, negative IV test and no paresthesia  Additional Notes Time out called. Patient placed in sitting position.  Back prepped and draped in sterile fashion.  A skin wheal was made in the L3-L4 interspace with 1% Lidocaine plain.  An 18G Tuohy needle was advanced into the epidural space by a loss of resistance technique.  The epidural catheter was threaded 4 cm into the epidural space and the test dose was negative.  Noted some skin bleeding which was controlled with pressure for several minutes and reinforced with sterile gauze.  No blood or paresthesiaas via needle or catheter.  The catheter was affixed to the back in sterile fashion.  The patient tolerated the procedure well.  Easy epidural X 1.Reason for block:procedure for pain

## 2017-02-07 NOTE — Progress Notes (Signed)
Stephanie Logan is a 26 y.o. [id number] at [redacted]w[redacted]d by LMP admitted for induction of labor due to IUFD.  Subjective: Denies pain, request epidural as soon as possible  Objective: BP 109/70   Pulse (!) 121   Temp 98.1 F (36.7 C) (Tympanic)   LMP 04/22/2016 (LMP Unknown)   SpO2 100%  No intake/output data recorded. No intake/output data recorded.   UC:   none SVE:    1/20/OOP  Labs: Lab Results  Component Value Date   WBC 12.0 (H) 02/07/2017   HGB 9.5 (L) 02/07/2017   HCT 30.0 (L) 02/07/2017   MCV 73.2 (L) 02/07/2017   PLT 312 02/07/2017    Assessment / Plan: IUFD at 36 weeks  discussed findings and plan of care with Dr Amalia Hailey, patient initially requested a cesarean delivery, but after discussing increased risks related to surgery in general she consented to induction of labor with vaginal delivery.  Labor: will start IOL now per patient request Preeclampsia:  labs stable Fetal Wellbeing:  NA Pain Control:  NA I/D:  n/a Anticipated MOD:  NSVD  Stephanie Logan 02/07/2017, 5:37 PM

## 2017-02-07 NOTE — OB Triage Note (Signed)
Patient presents to triage complaints of decreased fetal movement , patient reports not eating well but she is drinking plenty.

## 2017-02-07 NOTE — Progress Notes (Signed)
I was asked to provide a second opinion by Lorelle Gibbs, CNM, for her patient after the patient reported with no fetal movement.  A bedside ultrasound had been performed with no perceived fetal heart activity.  I was asked to verify this.  I performed a bedside ultrasound noting a single IUP in cephalic presentation, spine on maternal left, placenta anterior.  Amniotic fluid subjectively elevated.  No fetal movement noted throughout the scan that lasted about 20 minutes.  I attempted to view the heart in the long axis as well as transverse.  No visual cardiac activity noted.  M mode was utilized, as well as color flow doppler, both with no evidence of cardiac activity.   I assessed other vessels (umbilical arteries, aorta) in the fetus and in the floating umbilical cord.  No color flow activity noted. After this assessment, I concurred with the midwife's assessment and conveyed my findings to the patient and her husband in the presence of the patient's midwife and nurse.  Prentice Docker, MD 02/07/2017 5:24 PM

## 2017-02-07 NOTE — Anesthesia Preprocedure Evaluation (Signed)
Anesthesia Evaluation  Patient identified by MRN, date of birth, ID band Patient awake    Reviewed: Allergy & Precautions, NPO status , Patient's Chart, lab work & pertinent test results  Airway Mallampati: II       Dental no notable dental hx.    Pulmonary neg pulmonary ROS,    Pulmonary exam normal        Cardiovascular negative cardio ROS Normal cardiovascular exam     Neuro/Psych PSYCHIATRIC DISORDERS Anxiety Depression negative neurological ROS     GI/Hepatic negative GI ROS, Neg liver ROS,   Endo/Other  negative endocrine ROS  Renal/GU negative Renal ROS     Musculoskeletal negative musculoskeletal ROS (+)   Abdominal Normal abdominal exam  (+)   Peds negative pediatric ROS (+)  Hematology negative hematology ROS (+)   Anesthesia Other Findings Past Medical History: No date: Amenorrhea No date: Anxiety No date: PCOS (polycystic ovarian syndrome) No date: Post partum depression  Reproductive/Obstetrics (+) Pregnancy                             Anesthesia Physical Anesthesia Plan  ASA: II  Anesthesia Plan: Epidural   Post-op Pain Management:    Induction: Intravenous  Airway Management Planned: Natural Airway  Additional Equipment:   Intra-op Plan:   Post-operative Plan:   Informed Consent: I have reviewed the patients History and Physical, chart, labs and discussed the procedure including the risks, benefits and alternatives for the proposed anesthesia with the patient or authorized representative who has indicated his/her understanding and acceptance.     Plan Discussed with: CRNA and Surgeon  Anesthesia Plan Comments:         Anesthesia Quick Evaluation

## 2017-02-08 DIAGNOSIS — O364XX0 Maternal care for intrauterine death, not applicable or unspecified: Principal | ICD-10-CM

## 2017-02-08 DIAGNOSIS — Z3A35 35 weeks gestation of pregnancy: Secondary | ICD-10-CM

## 2017-02-08 LAB — TYPE AND SCREEN
ABO/RH(D): O POS
Antibody Screen: NEGATIVE
DAT, IGG: NEGATIVE
WEAK D: POSITIVE

## 2017-02-08 LAB — CBC
HCT: 29.5 % — ABNORMAL LOW (ref 35.0–47.0)
Hemoglobin: 9.4 g/dL — ABNORMAL LOW (ref 12.0–16.0)
MCH: 23.2 pg — AB (ref 26.0–34.0)
MCHC: 31.8 g/dL — AB (ref 32.0–36.0)
MCV: 72.9 fL — AB (ref 80.0–100.0)
PLATELETS: 329 10*3/uL (ref 150–440)
RBC: 4.04 MIL/uL (ref 3.80–5.20)
RDW: 15.1 % — AB (ref 11.5–14.5)
WBC: 15.2 10*3/uL — AB (ref 3.6–11.0)

## 2017-02-08 MED ORDER — ACETAMINOPHEN 325 MG PO TABS
650.0000 mg | ORAL_TABLET | ORAL | Status: DC | PRN
  Administered 2017-02-08: 650 mg via ORAL

## 2017-02-08 MED ORDER — BUTORPHANOL TARTRATE 2 MG/ML IJ SOLN
1.0000 mg | Freq: Once | INTRAMUSCULAR | Status: AC
  Administered 2017-02-08: 1 mg via INTRAVENOUS

## 2017-02-08 MED ORDER — CLONAZEPAM 1 MG PO TABS: 1.0000 mg | ORAL_TABLET | Freq: Three times a day (TID) | ORAL | 0 refills | Status: DC | PRN

## 2017-02-08 MED ORDER — OXYCODONE HCL 5 MG PO TABS: 10.0000 mg | ORAL_TABLET | ORAL | Status: DC | PRN

## 2017-02-08 MED ORDER — FLUOXETINE HCL 20 MG PO CAPS
60.0000 mg | ORAL_CAPSULE | Freq: Every day | ORAL | Status: DC
  Administered 2017-02-08: 60 mg via ORAL
  Filled 2017-02-08: qty 3

## 2017-02-08 MED ORDER — FENTANYL 2.5 MCG/ML W/ROPIVACAINE 0.2% IN NS 100 ML EPIDURAL INFUSION (ARMC-ANES)
EPIDURAL | Status: DC | PRN
  Administered 2017-02-07: 10 mL/h via EPIDURAL

## 2017-02-08 MED ORDER — SIMETHICONE 80 MG PO CHEW: 80.0000 mg | CHEWABLE_TABLET | ORAL | Status: DC | PRN

## 2017-02-08 MED ORDER — ONDANSETRON HCL 4 MG PO TABS: 4.0000 mg | ORAL_TABLET | ORAL | Status: DC | PRN

## 2017-02-08 MED ORDER — LIDOCAINE HCL (PF) 1 % IJ SOLN
INTRAMUSCULAR | Status: AC
  Filled 2017-02-08: qty 30

## 2017-02-08 MED ORDER — BUTORPHANOL TARTRATE 2 MG/ML IJ SOLN
INTRAMUSCULAR | Status: AC
  Administered 2017-02-08: 1 mg via INTRAVENOUS
  Filled 2017-02-08: qty 2

## 2017-02-08 MED ORDER — AMMONIA AROMATIC IN INHA
RESPIRATORY_TRACT | Status: AC
  Filled 2017-02-08: qty 10

## 2017-02-08 MED ORDER — DIBUCAINE 1 % RE OINT: 1.0000 "application " | TOPICAL_OINTMENT | RECTAL | Status: DC | PRN

## 2017-02-08 MED ORDER — OXYTOCIN 40 UNITS IN LACTATED RINGERS INFUSION - SIMPLE MED
INTRAVENOUS | Status: AC
  Filled 2017-02-08: qty 1000

## 2017-02-08 MED ORDER — WITCH HAZEL-GLYCERIN EX PADS: 1.0000 "application " | MEDICATED_PAD | CUTANEOUS | Status: DC | PRN

## 2017-02-08 MED ORDER — BUTORPHANOL TARTRATE 2 MG/ML IJ SOLN
1.0000 mg | Freq: Once | INTRAMUSCULAR | Status: AC
  Administered 2017-02-08: 1 mg via INTRAVENOUS
  Filled 2017-02-08: qty 2

## 2017-02-08 MED ORDER — OXYCODONE HCL 5 MG PO TABS: 5.0000 mg | ORAL_TABLET | ORAL | Status: DC | PRN

## 2017-02-08 MED ORDER — ZOLPIDEM TARTRATE 5 MG PO TABS: 5.0000 mg | ORAL_TABLET | Freq: Every evening | ORAL | Status: DC | PRN

## 2017-02-08 MED ORDER — OXYTOCIN 10 UNIT/ML IJ SOLN
INTRAMUSCULAR | Status: AC
  Filled 2017-02-08: qty 2

## 2017-02-08 MED ORDER — DIPHENHYDRAMINE HCL 25 MG PO CAPS: 25.0000 mg | ORAL_CAPSULE | Freq: Four times a day (QID) | ORAL | Status: DC | PRN

## 2017-02-08 MED ORDER — OXYCODONE-ACETAMINOPHEN 5-325 MG PO TABS: 1.0000 | ORAL_TABLET | ORAL | 0 refills | Status: DC | PRN

## 2017-02-08 MED ORDER — LIDOCAINE HCL (PF) 2 % IJ SOLN
INTRAMUSCULAR | Status: DC | PRN
  Administered 2017-02-07 – 2017-02-08 (×2): 10 mL via INTRADERMAL

## 2017-02-08 MED ORDER — ONDANSETRON HCL 4 MG/2ML IJ SOLN: 4.0000 mg | INTRAMUSCULAR | Status: DC | PRN

## 2017-02-08 MED ORDER — SENNOSIDES-DOCUSATE SODIUM 8.6-50 MG PO TABS: 2.0000 | ORAL_TABLET | ORAL | Status: DC

## 2017-02-08 MED ORDER — BENZOCAINE-MENTHOL 20-0.5 % EX AERO: 1.0000 "application " | INHALATION_SPRAY | CUTANEOUS | Status: DC | PRN

## 2017-02-08 MED ORDER — MISOPROSTOL 200 MCG PO TABS
ORAL_TABLET | ORAL | Status: AC
  Filled 2017-02-08: qty 4

## 2017-02-08 MED ORDER — IBUPROFEN 600 MG PO TABS
600.0000 mg | ORAL_TABLET | Freq: Four times a day (QID) | ORAL | Status: DC
  Administered 2017-02-08 (×3): 600 mg via ORAL
  Filled 2017-02-08 (×3): qty 1

## 2017-02-08 NOTE — Progress Notes (Signed)
Paperwork for cremation and return of remains signed with pt and husband, infant memory box, cd with photos and other mementos given to parents. Per patient request, infant taken from room as patient taken out for discharge. Follow up with Felisa Bonier, CNM 1-2 weeks, pt to call and make appt.  Online support group info card given to pt, instructions on breast and self care given.

## 2017-02-08 NOTE — Progress Notes (Signed)
Post Partum Day 1 Subjective: up ad lib, voiding, tolerating PO and c/o cramping and back pain at epidural site  Objective: Blood pressure (!) 91/56, pulse 79, temperature 98.3 F (36.8 C), temperature source Oral, resp. rate 16, height 5\' 3"  (1.6 m), weight 146 lb 3.2 oz (66.3 kg), last menstrual period 04/22/2016, SpO2 98 %.  Physical Exam:  General: cooperative, appears stated age, fatigued, no distress and pale Lochia: appropriate Uterine Fundus: firm Incision: NA DVT Evaluation: No evidence of DVT seen on physical exam. Negative Homan's sign.   Recent Labs  02/07/17 1712 02/08/17 0506  HGB 9.5* 9.4*  HCT 30.0* 29.5*    Assessment/Plan: Discharge home   LOS: 1 day   Stephanie Logan N Stephanie Logan 02/08/2017, 12:39 PM

## 2017-02-08 NOTE — Progress Notes (Signed)
Stephanie Logan is a 26 y.o. [id number] at [redacted]w[redacted]d by LMP admitted for induction of labor due to IUFD.  Subjective: Rates pain a 10 out of 10, rocking and crying with contractions  Objective: BP (!) 88/49   Pulse 94   Temp 98.9 F (37.2 C) (Oral)   Resp 16   Ht 5\' 3"  (1.6 m)   Wt 146 lb 3.2 oz (66.3 kg)   LMP 04/22/2016 (LMP Unknown)   SpO2 98%   BMI 25.90 kg/m  No intake/output data recorded. Total I/O In: -  Out: 73 [Urine:230]  FHT:   UC:   irregular, every 1-2 minutes, strong to palpation SVE:   Dilation: 7 Effacement (%): 100 Station: +1 Exam by:: m.Stephanie Logan cnm  Labs: Lab Results  Component Value Date   WBC 12.0 (H) 02/07/2017   HGB 9.5 (L) 02/07/2017   HCT 30.0 (L) 02/07/2017   MCV 73.2 (L) 02/07/2017   PLT 312 02/07/2017    Assessment / Plan: IOL due to fetal demise, progressing well after one dose cytotec  Labor: AROM with moderate amount light MSAF noted Preeclampsia:  labs stable Fetal Wellbeing:  NA Pain Control:  Epidural I/D:  n/a Anticipated MOD:  NSVD  Stephanie Logan N Stephanie Logan 02/08/2017, 1:38 AM

## 2017-02-09 ENCOUNTER — Encounter: Payer: Self-pay | Admitting: Obstetrics and Gynecology

## 2017-02-09 ENCOUNTER — Other Ambulatory Visit: Payer: Self-pay

## 2017-02-09 LAB — RPR: RPR: NONREACTIVE

## 2017-02-09 LAB — INFECT DISEASE AB IGM REFLEX 1

## 2017-02-09 LAB — TORCH-IGM(TOXO/ RUB/ CMV/ HSV) W TITER
CMV IgM: <30 AU/mL (ref 0.0–29.9)
Rubella IgM: <20 AU/mL (ref 0.0–19.9)
Toxoplasma Antibody- IgM: <3 AU/mL (ref 0.0–7.9)

## 2017-02-10 LAB — SURGICAL PATHOLOGY

## 2017-02-12 ENCOUNTER — Other Ambulatory Visit: Payer: Self-pay

## 2017-02-12 ENCOUNTER — Other Ambulatory Visit: Payer: Self-pay | Admitting: Obstetrics and Gynecology

## 2017-02-16 ENCOUNTER — Ambulatory Visit (INDEPENDENT_AMBULATORY_CARE_PROVIDER_SITE_OTHER)

## 2017-02-16 ENCOUNTER — Encounter: Payer: Self-pay | Admitting: Obstetrics and Gynecology

## 2017-02-16 ENCOUNTER — Ambulatory Visit (INDEPENDENT_AMBULATORY_CARE_PROVIDER_SITE_OTHER): Admitting: Obstetrics and Gynecology

## 2017-02-16 NOTE — Progress Notes (Signed)
Subjective:     Patient ID: Stephanie Logan, female   DOB: 19-Apr-1991, 26 y.o.   MRN: [id number]  HPI Here for follow up at 8 days postpartum of IUFD vaginal delivery of female infant, states is sleeping fine when takes klonipin,and feels like depression medications are working fine. Tearful but at appropriate times. States has great family support. Denies fever but is having cramping and still passing large lots daily. No heavy bleeding.  Review of Systems Depression screen [facility] 2/9 02/16/2017 07/03/2015  Decreased Interest 2 3  Down, Depressed, Hopeless 2 3  PHQ - 2 Score 4 6  Altered sleeping 2 3  Tired, decreased energy 2 3  Change in appetite 2 2  Feeling bad or failure about yourself  0 3  Trouble concentrating 2 3  Moving slowly or fidgety/restless 0 3  Suicidal thoughts 0 3  PHQ-9 Score 12 26  Difficult doing work/chores - Very difficult      Objective:   Physical Exam A&Ox4 Blood pressure 118/74, pulse 88, weight 134 lb 12.8 oz (61.1 kg), last menstrual period 04/22/2016, unknown if currently breastfeeding. Pelvic exam: VAGINA: normal appearing vagina with normal color and discharge, no lesions, CERVIX: multiparous os, moderate amount dark bloody discarge, UTERUS: tenderness on palpation, anteverted, enlarged to 8 week's size, ADNEXA: normal adnexa in size, nontender and no masses.   pelvic u/s reveals:  Findings:  The uterus measures 14.4 x 8 x 10.4 cm. Echo texture is homogenous without evidence of focal masses.  The Endometrium measures 14.5 mm.  There is a hypoechoic non-vascular mass seen in the lower uterine segment measurement 2.6 x 1.8 x 2.2 cm.  This could likely represent a blood clot.   Ovaries are not visualized. Survey of the adnexa demonstrates no adnexal masses. There is no free fluid in the cul de sac.  Impression: 1. There is a hypoechoic non-vascular mass seen in the lower uterine segment of the endometrium. Assessment:     S/P IUFD at 49  weeks Abnormal uterine bleeding in the postpartum Depression     Plan:     Reassured of normal findings on ultrasound RTC in 4-5 weeks for PPV or sooner if needed. To maintain pelvic rest until then.  Zamari Bonsall Naomi, CNM

## 2017-02-16 NOTE — Anesthesia Postprocedure Evaluation (Signed)
Anesthesia Post Note  Patient: Stephanie Logan  Procedure(s) Performed: * No procedures listed *  Patient location during evaluation: Mother Baby Anesthesia Type: Epidural Level of consciousness: awake and alert and oriented Pain management: pain level controlled Vital Signs Assessment: post-procedure vital signs reviewed and stable Respiratory status: spontaneous breathing Cardiovascular status: blood pressure returned to baseline Anesthetic complications: no Comments: Easy epidural X 1, but had some tenderness from skin oozing post epidural.     Last Vitals: There were no vitals filed for this visit.  Last Pain: There were no vitals filed for this visit.               Brittney Mucha

## 2017-02-17 ENCOUNTER — Encounter: Payer: Self-pay | Admitting: Obstetrics and Gynecology

## 2017-02-17 ENCOUNTER — Other Ambulatory Visit: Payer: Self-pay

## 2017-02-19 ENCOUNTER — Other Ambulatory Visit: Payer: Self-pay | Admitting: Obstetrics and Gynecology

## 2017-02-19 MED ORDER — PROGESTERONE MICRONIZED 100 MG PO CAPS: 100.0000 mg | ORAL_CAPSULE | Freq: Every day | ORAL | 1 refills | Status: DC

## 2017-02-24 ENCOUNTER — Encounter: Payer: Self-pay | Admitting: Obstetrics and Gynecology

## 2017-02-24 NOTE — Telephone Encounter (Signed)
Patient called while the EPIC system was down and wants to speak with Mel or Amy about the clots that she is still passing and the one she passed today that was pretty big. Please call

## 2017-03-04 ENCOUNTER — Encounter: Payer: Self-pay | Admitting: Obstetrics and Gynecology

## 2017-03-17 ENCOUNTER — Encounter: Payer: Self-pay | Admitting: Obstetrics and Gynecology

## 2017-03-17 ENCOUNTER — Ambulatory Visit (INDEPENDENT_AMBULATORY_CARE_PROVIDER_SITE_OTHER): Admitting: Obstetrics and Gynecology

## 2017-03-17 DIAGNOSIS — R300 Dysuria: Secondary | ICD-10-CM

## 2017-03-17 DIAGNOSIS — O99345 Other mental disorders complicating the puerperium: Secondary | ICD-10-CM

## 2017-03-17 DIAGNOSIS — F53 Postpartum depression: Secondary | ICD-10-CM

## 2017-03-17 DIAGNOSIS — F332 Major depressive disorder, recurrent severe without psychotic features: Secondary | ICD-10-CM

## 2017-03-17 LAB — POCT URINALYSIS DIPSTICK
Bilirubin, UA: NEGATIVE
Blood, UA: NEGATIVE
Glucose, UA: NEGATIVE
Ketones, UA: NEGATIVE
NITRITE UA: NEGATIVE
Protein, UA: NEGATIVE
Spec Grav, UA: 1.015 (ref 1.010–1.025)
UROBILINOGEN UA: 0.2 U/dL
pH, UA: 7 (ref 5.0–8.0)

## 2017-03-17 MED ORDER — ALPRAZOLAM 0.5 MG PO TABS: 0.5000 mg | ORAL_TABLET | Freq: Every evening | ORAL | 2 refills | Status: DC | PRN

## 2017-03-17 MED ORDER — TRIAZOLAM 0.125 MG PO TABS: 0.1250 mg | ORAL_TABLET | Freq: Every evening | ORAL | 2 refills | Status: DC | PRN

## 2017-03-17 MED ORDER — NITROFURANTOIN MONOHYD MACRO 100 MG PO CAPS: 100.0000 mg | ORAL_CAPSULE | Freq: Two times a day (BID) | ORAL | 1 refills | Status: DC

## 2017-03-17 NOTE — Progress Notes (Signed)
   Subjective:     Stephanie Logan is a 26 y.o. female who presents for a postpartum visit. She is 6 weeks postpartum following a spontaneous vaginal delivery with fetal demise secondary to cord accident. I have fully reviewed the prenatal and intrapartum course. The delivery was at 21 gestational weeks. Outcome: spontaneous vaginal delivery. Anesthesia: epidural. Postpartum course has been complicated by depression. Bleeding no bleeding. Bowel function is normal. Bladder function is normal. Patient is not sexually active. Contraception method is abstinence. Postpartum depression screening: positive.  The following portions of the patient's history were reviewed and updated as appropriate: allergies, current medications, past family history, past medical history, past social history, past surgical history and problem list.  Review of Systems Pertinent items noted in HPI and remainder of comprehensive ROS otherwise negative.  Depression screen [facility] 2/9 03/17/2017 02/16/2017 07/03/2015  Decreased Interest 2 2 3   Down, Depressed, Hopeless 2 2 3   PHQ - 2 Score 4 4 6   Altered sleeping 2 2 3   Tired, decreased energy 3 2 3   Change in appetite 2 2 2   Feeling bad or failure about yourself  0 0 3  Trouble concentrating 3 2 3   Moving slowly or fidgety/restless 0 0 3  Suicidal thoughts 0 0 3  PHQ-9 Score 14 12 26   Difficult doing work/chores - - Very difficult   Objective:    BP 98/66   Pulse 66   Ht 5\' 2"  (1.575 m)   Wt 128 lb (58.1 kg)   BMI 23.41 kg/m   General:  alert, cooperative and appears stated age   Breasts:  inspection negative, no nipple discharge or bleeding, no masses or nodularity palpable  Lungs: clear to auscultation bilaterally  Heart:  regular rate and rhythm, S1, S2 normal, no murmur, click, rub or gallop  Abdomen: soft, non-tender; bowel sounds normal; no masses,  no organomegaly   Vulva:  normal  Vagina: normal vagina, no discharge, exudate, lesion, or erythema  Cervix:   multiparous appearance  Corpus: normal size, contour, position, consistency, mobility, non-tender  Adnexa:  no mass, fullness, tenderness  Rectal Exam: Not performed.        Assessment:     6 weeks postpartum exam, with 36 weeks fetal demise. Depression with anxiety  Pap smear not done at today's visit.   Plan:    1. Contraception: condoms 2. Depression with anxiety- changes prn and sleep meds to xanax in daytime and triazolam at bedtime. Will keep prozac as she is already taking. Is in counseling and encouraged to continue.  Counseled regarding future pregnancy and recommended waiting until she had at least 2 regular menses by then. 3. Follow up in: 3 months or as needed.

## 2017-03-17 NOTE — Patient Instructions (Signed)
Place postpartum visit patient instructions here.  Postpartum Depression and Baby Blues The postpartum period begins right after the birth of a baby. During this time, there is often a great amount of joy and excitement. It is also a time of many changes in the life of the parents. Regardless of how many times a mother gives birth, each child brings new challenges and dynamics to the family. It is not unusual to have feelings of excitement along with confusing shifts in moods, emotions, and thoughts. All mothers are at risk of developing postpartum depression or the "baby blues." These mood changes can occur right after giving birth, or they may occur many months after giving birth. The baby blues or postpartum depression can be mild or severe. Additionally, postpartum depression can go away rather quickly, or it can be a long-term condition. What are the causes? Raised hormone levels and the rapid drop in those levels are thought to be a main cause of postpartum depression and the baby blues. A number of hormones change during and after pregnancy. Estrogen and progesterone usually decrease right after the delivery of your baby. The levels of thyroid hormone and various cortisol steroids also rapidly drop. Other factors that play a role in these mood changes include major life events and genetics. What increases the risk? If you have any of the following risks for the baby blues or postpartum depression, know what symptoms to watch out for during the postpartum period. Risk factors that may increase the likelihood of getting the baby blues or postpartum depression include:  Having a personal or family history of depression.  Having depression while being pregnant.  Having premenstrual mood issues or mood issues related to oral contraceptives.  Having a lot of life stress.  Having marital conflict.  Lacking a social support network.  Having a baby with special needs.  Having health problems,  such as diabetes.  What are the signs or symptoms? Symptoms of baby blues include:  Brief changes in mood, such as going from extreme happiness to sadness.  Decreased concentration.  Difficulty sleeping.  Crying spells, tearfulness.  Irritability.  Anxiety.  Symptoms of postpartum depression typically begin within the first month after giving birth. These symptoms include:  Difficulty sleeping or excessive sleepiness.  Marked weight loss.  Agitation.  Feelings of worthlessness.  Lack of interest in activity or food.  Postpartum psychosis is a very serious condition and can be dangerous. Fortunately, it is rare. Displaying any of the following symptoms is cause for immediate medical attention. Symptoms of postpartum psychosis include:  Hallucinations and delusions.  Bizarre or disorganized behavior.  Confusion or disorientation.  How is this diagnosed? A diagnosis is made by an evaluation of your symptoms. There are no medical or lab tests that lead to a diagnosis, but there are various questionnaires that a health care provider may use to identify those with the baby blues, postpartum depression, or psychosis. Often, a screening tool called the [place] Postnatal Depression Scale is used to diagnose depression in the postpartum period. How is this treated? The baby blues usually goes away on its own in 1-2 weeks. Social support is often all that is needed. You will be encouraged to get adequate sleep and rest. Occasionally, you may be given medicines to help you sleep. Postpartum depression requires treatment because it can last several months or longer if it is not treated. Treatment may include individual or group therapy, medicine, or both to address any social, physiological, and psychological factors  that may play a role in the depression. Regular exercise, a healthy diet, rest, and social support may also be strongly recommended. Postpartum psychosis is more serious  and needs treatment right away. Hospitalization is often needed. Follow these instructions at home:  Get as much rest as you can. Nap when the baby sleeps.  Exercise regularly. Some women find yoga and walking to be beneficial.  Eat a balanced and nourishing diet.  Do little things that you enjoy. Have a cup of tea, take a bubble bath, read your favorite magazine, or listen to your favorite music.  Avoid alcohol.  Ask for help with household chores, cooking, grocery shopping, or running errands as needed. Do not try to do everything.  Talk to people close to you about how you are feeling. Get support from your partner, family members, friends, or other new moms.  Try to stay positive in how you think. Think about the things you are grateful for.  Do not spend a lot of time alone.  Only take over-the-counter or prescription medicine as directed by your health care provider.  Keep all your postpartum appointments.  Let your health care provider know if you have any concerns. Contact a health care provider if: You are having a reaction to or problems with your medicine. Get help right away if:  You have suicidal feelings.  You think you may harm the baby or someone else. This information is not intended to replace advice given to you by your health care provider. Make sure you discuss any questions you have with your health care provider. Document Released: 06/11/2004 Document Revised: 02/13/2016 Document Reviewed: 06/19/2013 Elsevier Interactive Patient Education  2017 [employer].

## 2017-03-18 LAB — VITAMIN D 25 HYDROXY (VIT D DEFICIENCY, FRACTURES): Vit D, 25-Hydroxy: 49.3 ng/mL (ref 30.0–100.0)

## 2017-03-18 LAB — CBC
Hematocrit: 38.1 % (ref 34.0–46.6)
Hemoglobin: 11.9 g/dL (ref 11.1–15.9)
MCH: 24.2 pg — ABNORMAL LOW (ref 26.6–33.0)
MCHC: 31.2 g/dL — AB (ref 31.5–35.7)
MCV: 77 fL — ABNORMAL LOW (ref 79–97)
PLATELETS: 263 10*3/uL (ref 150–379)
RBC: 4.92 x10E6/uL (ref 3.77–5.28)
RDW: 19.4 % — AB (ref 12.3–15.4)
WBC: 4.7 10*3/uL (ref 3.4–10.8)

## 2017-03-18 LAB — B12 AND FOLATE PANEL
FOLATE: 8.8 ng/mL (ref >3.0)
Vitamin B-12: 343 pg/mL (ref 232–1245)

## 2017-03-18 LAB — FERRITIN: FERRITIN: 28 ng/mL (ref 15–150)

## 2017-03-19 LAB — URINE CULTURE

## 2017-03-25 ENCOUNTER — Telehealth: Payer: Self-pay | Admitting: Obstetrics and Gynecology

## 2017-03-25 NOTE — Telephone Encounter (Signed)
Could be a number of things. If it worsens I would have her go to urgent care, otherwise prob. Needs to be seen by ENT  Melody

## 2017-03-25 NOTE — Telephone Encounter (Signed)
pls advise

## 2017-03-25 NOTE — Telephone Encounter (Signed)
Patient with pressure and pain in right ear now she is unsteady on her feet  Please call

## 2017-03-25 NOTE — Telephone Encounter (Signed)
Notified pt. 

## 2017-04-19 ENCOUNTER — Other Ambulatory Visit: Payer: Self-pay | Admitting: Obstetrics and Gynecology

## 2017-06-17 ENCOUNTER — Encounter: Payer: Self-pay | Admitting: Obstetrics and Gynecology

## 2017-06-17 ENCOUNTER — Other Ambulatory Visit: Payer: Self-pay | Admitting: Obstetrics and Gynecology

## 2017-06-17 ENCOUNTER — Ambulatory Visit (INDEPENDENT_AMBULATORY_CARE_PROVIDER_SITE_OTHER): Admitting: Obstetrics and Gynecology

## 2017-06-17 VITALS — Wt 135.4 lb

## 2017-06-17 DIAGNOSIS — N926 Irregular menstruation, unspecified: Secondary | ICD-10-CM

## 2017-06-17 DIAGNOSIS — Z01411 Encounter for gynecological examination (general) (routine) with abnormal findings: Secondary | ICD-10-CM | POA: Diagnosis not present

## 2017-06-17 MED ORDER — SERTRALINE HCL 50 MG PO TABS: 50.0000 mg | ORAL_TABLET | Freq: Every day | ORAL | 4 refills | Status: DC

## 2017-06-17 MED ORDER — ALPRAZOLAM 0.5 MG PO TABS: 0.5000 mg | ORAL_TABLET | Freq: Two times a day (BID) | ORAL | 2 refills | Status: DC | PRN

## 2017-06-17 NOTE — Progress Notes (Signed)
   Subjective:     Stephanie Logan is a married white  26 y.o. female and is here for a comprehensive physical exam. G3P3002, SVD x3. Full time homemaker. The patient reports problems - drop down to 40mg  prozac due to feeling foggy. but anxiety so much worse. can't sleep without taking xanax but sleeps all night when she takes it. took zoloft in past and clonazapem. .  Social History   Social History  . Marital status: Married    Spouse name: N/A  . Number of children: N/A  . Years of education: N/A   Occupational History  . Not on file.   Social History Main Topics  . Smoking status: Never Smoker  . Smokeless tobacco: Never Used  . Alcohol use Yes     Comment: occas  . Drug use: No  . Sexual activity: Yes    Birth control/ protection: None   Other Topics Concern  . Not on file   Social History Narrative  . No narrative on file   Health Maintenance  Topic Date Due  . INFLUENZA VACCINE  04/21/2017  . PAP SMEAR  06/19/2019  . TETANUS/TDAP  12/18/2026  . HIV Screening  Completed    The following portions of the patient's history were reviewed and updated as appropriate: allergies, current medications, past family history, past medical history, past social history, past surgical history and problem list.  Review of Systems Pertinent items noted in HPI and remainder of comprehensive ROS otherwise negative.   Objective:    General appearance: alert, cooperative and appears stated age Neck: no adenopathy, no carotid bruit, no JVD, supple, symmetrical, trachea midline and thyroid not enlarged, symmetric, no tenderness/mass/nodules Lungs: clear to auscultation bilaterally Breasts: normal appearance, no masses or tenderness Heart: regular rate and rhythm, S1, S2 normal, no murmur, click, rub or gallop Abdomen: soft, non-tender; bowel sounds normal; no masses,  no organomegaly Pelvic: cervix normal in appearance, external genitalia normal, no adnexal masses or tenderness,  no cervical motion tenderness, rectovaginal septum normal, uterus normal size, shape, and consistency and vagina normal without discharge    Assessment:    Healthy female exam.  Anxiety and depression     Plan:  Labs obtained- will follow up accordingly RTC 1 year or as needed   See After Visit Summary for Counseling Recommendations

## 2017-06-17 NOTE — Patient Instructions (Signed)
Place annual gynecologic exam patient instructions here.

## 2017-06-18 ENCOUNTER — Other Ambulatory Visit: Payer: Self-pay | Admitting: Obstetrics and Gynecology

## 2017-06-18 LAB — COMPREHENSIVE METABOLIC PANEL
ALK PHOS: 62 IU/L (ref 39–117)
ALT: 12 IU/L (ref 0–32)
AST: 18 IU/L (ref 0–40)
Albumin/Globulin Ratio: 1.7 (ref 1.2–2.2)
Albumin: 5 g/dL (ref 3.5–5.5)
BILIRUBIN TOTAL: 0.4 mg/dL (ref 0.0–1.2)
BUN/Creatinine Ratio: 17 (ref 9–23)
BUN: 14 mg/dL (ref 6–20)
CO2: 26 mmol/L (ref 20–29)
Calcium: 9.7 mg/dL (ref 8.7–10.2)
Chloride: 98 mmol/L (ref 96–106)
Creatinine, Ser: 0.81 mg/dL (ref 0.57–1.00)
GFR calc Af Amer: 114 mL/min/{1.73_m2} (ref >59)
GFR, EST NON AFRICAN AMERICAN: 99 mL/min/{1.73_m2} (ref >59)
GLUCOSE: 82 mg/dL (ref 65–99)
Globulin, Total: 2.9 g/dL (ref 1.5–4.5)
POTASSIUM: 4.4 mmol/L (ref 3.5–5.2)
Sodium: 138 mmol/L (ref 134–144)
Total Protein: 7.9 g/dL (ref 6.0–8.5)

## 2017-06-18 LAB — CBC
HEMATOCRIT: 39.1 % (ref 34.0–46.6)
HEMOGLOBIN: 12.5 g/dL (ref 11.1–15.9)
MCH: 28 pg (ref 26.6–33.0)
MCHC: 32 g/dL (ref 31.5–35.7)
MCV: 88 fL (ref 79–97)
PLATELETS: 314 10*3/uL (ref 150–379)
RBC: 4.46 x10E6/uL (ref 3.77–5.28)
RDW: 13.5 % (ref 12.3–15.4)
WBC: 5.8 10*3/uL (ref 3.4–10.8)

## 2017-06-18 LAB — BETA HCG QUANT (REF LAB): hCG Quant: <1 m[IU]/mL

## 2017-06-18 LAB — FERRITIN: Ferritin: 10 ng/mL — ABNORMAL LOW (ref 15–150)

## 2017-06-18 LAB — B12 AND FOLATE PANEL
FOLATE: 9.8 ng/mL (ref >3.0)
Vitamin B-12: 416 pg/mL (ref 232–1245)

## 2017-06-18 LAB — FSH/LH
FSH: 7.6 m[IU]/mL
LH: 24.7 m[IU]/mL

## 2017-06-18 LAB — VITAMIN D 25 HYDROXY (VIT D DEFICIENCY, FRACTURES): VIT D 25 HYDROXY: 36.8 ng/mL (ref 30.0–100.0)

## 2017-06-18 MED ORDER — FUSION PLUS PO CAPS: 1.0000 | ORAL_CAPSULE | Freq: Every day | ORAL | 1 refills | Status: DC

## 2017-06-22 LAB — CYTOLOGY - PAP

## 2017-06-23 ENCOUNTER — Encounter: Payer: Self-pay | Admitting: Obstetrics and Gynecology

## 2017-09-17 ENCOUNTER — Other Ambulatory Visit: Payer: Self-pay

## 2017-09-17 ENCOUNTER — Emergency Department

## 2017-09-17 ENCOUNTER — Emergency Department
Admission: EM | Admit: 2017-09-17 | Discharge: 2017-09-17 | Disposition: A | Discharge status: Home / Self Care | Attending: Emergency Medicine | Admitting: Emergency Medicine

## 2017-09-17 ENCOUNTER — Encounter: Payer: Self-pay | Admitting: Emergency Medicine

## 2017-09-17 DIAGNOSIS — F329 Major depressive disorder, single episode, unspecified: Secondary | ICD-10-CM | POA: Diagnosis not present

## 2017-09-17 DIAGNOSIS — Y998 Other external cause status: Secondary | ICD-10-CM | POA: Insufficient documentation

## 2017-09-17 DIAGNOSIS — W208XXA Other cause of strike by thrown, projected or falling object, initial encounter: Secondary | ICD-10-CM | POA: Diagnosis not present

## 2017-09-17 DIAGNOSIS — Y9389 Activity, other specified: Secondary | ICD-10-CM | POA: Diagnosis not present

## 2017-09-17 DIAGNOSIS — S92411A Displaced fracture of proximal phalanx of right great toe, initial encounter for closed fracture: Secondary | ICD-10-CM | POA: Insufficient documentation

## 2017-09-17 DIAGNOSIS — F419 Anxiety disorder, unspecified: Secondary | ICD-10-CM | POA: Diagnosis not present

## 2017-09-17 DIAGNOSIS — Y929 Unspecified place or not applicable: Secondary | ICD-10-CM | POA: Insufficient documentation

## 2017-09-17 DIAGNOSIS — Z79899 Other long term (current) drug therapy: Secondary | ICD-10-CM | POA: Diagnosis not present

## 2017-09-17 DIAGNOSIS — S99921A Unspecified injury of right foot, initial encounter: Secondary | ICD-10-CM | POA: Diagnosis present

## 2017-09-17 MED ORDER — OXYCODONE-ACETAMINOPHEN 5-325 MG PO TABS: 1.0000 | ORAL_TABLET | Freq: Four times a day (QID) | ORAL | 0 refills | Status: AC | PRN

## 2017-09-17 MED ORDER — OXYCODONE-ACETAMINOPHEN 5-325 MG PO TABS
1.0000 | ORAL_TABLET | Freq: Once | ORAL | Status: AC
  Administered 2017-09-17: 1 via ORAL
  Filled 2017-09-17: qty 1

## 2017-09-17 MED ORDER — IBUPROFEN 800 MG PO TABS: 800.0000 mg | ORAL_TABLET | Freq: Three times a day (TID) | ORAL | 0 refills | Status: DC | PRN

## 2017-09-17 NOTE — ED Notes (Signed)
See triage note  States she dropped a heavy piece of wood onto top of right foot  No swelling or deformity noted   Good pulses

## 2017-09-17 NOTE — ED Provider Notes (Signed)
[facility] Emergency Department Provider Note  ____________________________________________  Time seen: Approximately 3:30 PM  I have reviewed the triage vital signs and the nursing notes.   HISTORY  Chief Complaint Foot Pain    HPI Stephanie Logan is a 26 y.o. female that presents to the emergency department for evaluation of pain to right great toe after dropping a piece of hard wood on her foot.  She is having some numbness and tingling on the side of her toe.  No additional injuries or concerns.  Past Medical History:  Diagnosis Date  . Amenorrhea   . Anxiety   . PCOS (polycystic ovarian syndrome)   . Post partum depression     Patient Active Problem List   Diagnosis Date Noted  . IUFD at 67 weeks or more of gestation 02/07/2017  . Chronic pain of right hip 02/18/2016  . Chronic pain of right knee 02/18/2016  . Depression with anxiety 02/18/2016  . Severe episode of recurrent major depressive disorder, without psychotic features ([place])   . PCOS (polycystic ovarian syndrome) 07/19/2015    Past Surgical History:  Procedure Laterality Date  . BREAST FIBROADENOMA SURGERY  2008,2010    Prior to Admission medications   Medication Sig Start Date End Date Taking? Authorizing Provider  ALPRAZolam Duanne Moron) 0.5 MG tablet Take 1 tablet (0.5 mg total) by mouth 2 (two) times daily as needed for anxiety. 06/17/17   Shambley, Melody N, CNM  clonazePAM (KLONOPIN) 1 MG tablet Take 1 tablet (1 mg total) by mouth 3 (three) times daily as needed (anxiety). Patient not taking: Reported on 06/17/2017 02/08/17   Joylene Igo, CNM  ibuprofen (ADVIL,MOTRIN) 800 MG tablet Take 1 tablet (800 mg total) by mouth every 8 (eight) hours as needed. 09/17/17   Laban Emperor, PA-C  Iron-FA-B Cmp-C-Biot-Probiotic (FUSION PLUS) CAPS Take 1 capsule by mouth daily. 06/18/17   Shambley, Melody N, CNM  oxyCODONE-acetaminophen (ROXICET) 5-325 MG tablet Take 1 tablet by mouth every  6 (six) hours as needed for up to 3 days. 09/17/17 09/20/17  Laban Emperor, PA-C  sertraline (ZOLOFT) 50 MG tablet Take 1 tablet (50 mg total) by mouth daily. 06/17/17   Shambley, Melody N, CNM  triazolam (HALCION) 0.125 MG tablet Take 1 tablet (0.125 mg total) by mouth at bedtime as needed for sleep. Patient not taking: Reported on 06/17/2017 03/17/17   Joylene Igo, CNM    Allergies Ciprofloxacin  Family History  Problem Relation Age of Onset  . Cancer Maternal Grandmother        brain  . Cancer Mother        cervical, precancerous  . Seizures Sister     Social History Social History   Tobacco Use  . Smoking status: Never Smoker  . Smokeless tobacco: Never Used  Substance Use Topics  . Alcohol use: Yes    Comment: occas  . Drug use: No     Review of Systems  Cardiovascular: No chest pain. Respiratory:  No SOB. Gastrointestinal: No abdominal pain.  Musculoskeletal: Positive for toe and foot pain. Skin: Negative for rash, abrasions, lacerations, ecchymosis.   ____________________________________________   PHYSICAL EXAM:  VITAL SIGNS: ED Triage Vitals  Enc Vitals Group     BP 09/17/17 1425 (!) 145/80     Pulse Rate 09/17/17 1425 (!) 105     Resp 09/17/17 1425 (!) 22     Temp 09/17/17 1425 98.4 F (36.9 C)     Temp Source 09/17/17 1425 Oral  SpO2 09/17/17 1425 100 %     Weight 09/17/17 1426 130 lb (59 kg)     Height 09/17/17 1426 5\' 2"  (1.575 m)     Head Circumference --      Peak Flow --      Pain Score 09/17/17 1425 9     Pain Loc --      Pain Edu? --      Excl. in [place]? --      Constitutional: Alert and oriented. Well appearing and in no acute distress. Eyes: Conjunctivae are normal. PERRL. EOMI. Head: Atraumatic. ENT:      Ears:      Nose: No congestion/rhinnorhea.      Mouth/Throat: Mucous membranes are moist.  Neck: No stridor.  Cardiovascular:  Good peripheral circulation. Cap refill less than 3 seconds. Respiratory: Normal  respiratory effort without tachypnea or retractions. Good air entry to the bases with no decreased or absent breath sounds. Musculoskeletal: Full range of motion to all extremities. No gross deformities appreciated.  Mild swelling to base of right great toe.  Tenderness to palpation over dorsal right great toe.  Range of motion limited due to pain.   Neurologic:  Normal speech and language. No gross focal neurologic deficits are appreciated.  Skin:  Skin is warm, dry and intact. No rash noted.  ____________________________________________   LABS (all labs ordered are listed, but only abnormal results are displayed)  Labs Reviewed - No data to display ____________________________________________  EKG   ____________________________________________  RADIOLOGY Robinette Haines, personally viewed and evaluated these images (plain radiographs) as part of my medical decision making, as well as reviewing the written report by the radiologist.  Dg Foot Complete Right  Result Date: 09/17/2017 CLINICAL DATA:  Right foot pain as well as pain of the right big toe status post trauma. EXAM: RIGHT FOOT COMPLETE - 3+ VIEW COMPARISON:  None. FINDINGS: There is comminuted displaced intra-articular fracture of the right first distal phalanx. There is no dislocation. There is no evidence of arthropathy or other focal bone abnormality. Soft tissues are unremarkable. IMPRESSION: Fracture of the right first distal phalanx. Electronically Signed   By: Abelardo Diesel M.D.   On: 09/17/2017 14:57    ____________________________________________    PROCEDURES  Procedure(s) performed:    Procedures    Medications  oxyCODONE-acetaminophen (PERCOCET/ROXICET) 5-325 MG per tablet 1 tablet (1 tablet Oral Given 09/17/17 1525)     ____________________________________________   INITIAL IMPRESSION / ASSESSMENT AND PLAN / ED COURSE  Pertinent labs & imaging results that were available during my care of the  patient were reviewed by me and considered in my medical decision making (see chart for details).  Review of the [place] CSRS was performed in accordance of the [place] prior to dispensing any controlled drugs.   Patient's diagnosis is consistent with toe fracture.  Vital signs and exam are reassuring.  X-ray consistent with comminuted displaced intra-articular great toe fracture.  Postop shoe and crutches were given.  Patient will be discharged home with prescriptions for Percocet and ibuprofen. Patient is to follow up with podiatry next week. Patient is given ED precautions to return to the ED for any worsening or new symptoms.   ____________________________________________  FINAL CLINICAL IMPRESSION(S) / ED DIAGNOSES  Final diagnoses:  Closed displaced fracture of proximal phalanx of right great toe, initial encounter      NEW MEDICATIONS STARTED DURING THIS VISIT:  ED Discharge Orders        Ordered  ibuprofen (ADVIL,MOTRIN) 800 MG tablet  Every 8 hours PRN     09/17/17 1536    oxyCODONE-acetaminophen (ROXICET) 5-325 MG tablet  Every 6 hours PRN     09/17/17 1536          This chart was dictated using voice recognition software/Dragon. Despite best efforts to proofread, errors can occur which can change the meaning. Any change was purely unintentional.    Laban Emperor, PA-C 09/17/17 1603    Nena Polio, MD 09/19/17 [phone]

## 2017-09-17 NOTE — ED Triage Notes (Signed)
Pt c/o pain to right foot. Dropped piece of wood to foot. No obvious deformity. Appears in pain. No bleeding.

## 2017-09-21 NOTE — L&D Delivery Note (Signed)
Delivery Note At  2036 a viable and healthy female "Stephanie Logan" was delivered via  (Presentation:LOA ;  ).  APGAR: ,8  .   Placenta status: delivered intact with 3 vessel  Cord:  with the following complications: none, NCx2 easily reduced on perineum  Anesthesia:  epidural Episiotomy:  none Lacerations:  none Suture Repair: NA Est. Blood Loss (mL):  300  Mom to postpartum.  Baby to Couplet care / Skin to Skin.  Laria Grimmett N Hinton Luellen 08/31/2018, 8:51 PM

## 2017-12-07 ENCOUNTER — Other Ambulatory Visit: Payer: Self-pay | Admitting: Obstetrics and Gynecology

## 2018-01-03 ENCOUNTER — Other Ambulatory Visit

## 2018-01-03 ENCOUNTER — Other Ambulatory Visit: Payer: Self-pay | Admitting: Obstetrics and Gynecology

## 2018-01-03 DIAGNOSIS — N926 Irregular menstruation, unspecified: Secondary | ICD-10-CM

## 2018-01-04 ENCOUNTER — Ambulatory Visit (INDEPENDENT_AMBULATORY_CARE_PROVIDER_SITE_OTHER)

## 2018-01-04 ENCOUNTER — Other Ambulatory Visit: Payer: Self-pay | Admitting: Obstetrics and Gynecology

## 2018-01-04 DIAGNOSIS — N926 Irregular menstruation, unspecified: Secondary | ICD-10-CM

## 2018-01-04 LAB — BETA HCG QUANT (REF LAB): HCG QUANT: 1841 m[IU]/mL

## 2018-01-06 ENCOUNTER — Other Ambulatory Visit: Payer: Self-pay

## 2018-01-11 ENCOUNTER — Encounter: Payer: Self-pay | Admitting: Obstetrics and Gynecology

## 2018-01-14 ENCOUNTER — Ambulatory Visit (INDEPENDENT_AMBULATORY_CARE_PROVIDER_SITE_OTHER)

## 2018-01-14 DIAGNOSIS — N926 Irregular menstruation, unspecified: Secondary | ICD-10-CM | POA: Diagnosis not present

## 2018-01-28 ENCOUNTER — Ambulatory Visit: Admitting: Obstetrics and Gynecology

## 2018-01-28 VITALS — BP 105/57 | HR 75 | Wt 145.4 lb

## 2018-01-28 DIAGNOSIS — Z3481 Encounter for supervision of other normal pregnancy, first trimester: Secondary | ICD-10-CM

## 2018-01-28 NOTE — Progress Notes (Signed)
Stephanie Logan presents for NOB nurse interview visit. Pregnancy confirmation done 01/14/18 [facility]______.  G-4 .  P- 2   . Pregnancy education material explained and given. _0__ cats in the home. NOB labs ordered.  HIV labs and Drug screen were explained optional and she did not decline. Drug screen ordered. PNV encouraged. Genetic screening options discussed. Genetic testing: Ordered.  Pt may discuss with provider. Pt. To follow up with provider in __3 weeks for NOB physical.  All questions answered.

## 2018-01-29 LAB — URINALYSIS, ROUTINE W REFLEX MICROSCOPIC
BILIRUBIN UA: NEGATIVE
GLUCOSE, UA: NEGATIVE
Ketones, UA: NEGATIVE
Nitrite, UA: NEGATIVE
PH UA: 5.5 (ref 5.0–7.5)
PROTEIN UA: NEGATIVE
RBC UA: NEGATIVE
Specific Gravity, UA: 1.024 (ref 1.005–1.030)
Urobilinogen, Ur: 0.2 mg/dL (ref 0.2–1.0)

## 2018-01-29 LAB — CBC WITH DIFFERENTIAL
BASOS: 0 %
Basophils Absolute: 0 10*3/uL (ref 0.0–0.2)
EOS (ABSOLUTE): 0.1 10*3/uL (ref 0.0–0.4)
EOS: 1 %
HEMATOCRIT: 34.8 % (ref 34.0–46.6)
HEMOGLOBIN: 11.6 g/dL (ref 11.1–15.9)
IMMATURE GRANS (ABS): 0 10*3/uL (ref 0.0–0.1)
Immature Granulocytes: 0 %
LYMPHS ABS: 1.8 10*3/uL (ref 0.7–3.1)
LYMPHS: 19 %
MCH: 27.9 pg (ref 26.6–33.0)
MCHC: 33.3 g/dL (ref 31.5–35.7)
MCV: 84 fL (ref 79–97)
Monocytes Absolute: 0.6 10*3/uL (ref 0.1–0.9)
Monocytes: 7 %
NEUTROS ABS: 6.6 10*3/uL (ref 1.4–7.0)
Neutrophils: 73 %
RBC: 4.16 x10E6/uL (ref 3.77–5.28)
RDW: 14.9 % (ref 12.3–15.4)
WBC: 9.1 10*3/uL (ref 3.4–10.8)

## 2018-01-29 LAB — MICROSCOPIC EXAMINATION
CASTS: NONE SEEN /LPF
Epithelial Cells (non renal): >10 /hpf — AB (ref 0–10)

## 2018-01-29 LAB — HIV ANTIBODY (ROUTINE TESTING W REFLEX): HIV Screen 4th Generation wRfx: NONREACTIVE

## 2018-01-29 LAB — ANTIBODY SCREEN: Antibody Screen: NEGATIVE

## 2018-01-29 LAB — RUBELLA SCREEN: RUBELLA: 2.53 {index} (ref >0.99)

## 2018-01-29 LAB — ABO AND RH: Rh Factor: POSITIVE

## 2018-01-29 LAB — RPR: RPR: NONREACTIVE

## 2018-01-29 LAB — HEPATITIS B SURFACE ANTIGEN: Hepatitis B Surface Ag: NEGATIVE

## 2018-01-29 LAB — VARICELLA ZOSTER ANTIBODY, IGG: Varicella zoster IgG: 360 index (ref >165)

## 2018-01-29 LAB — GC/CHLAMYDIA PROBE AMP
CHLAMYDIA, DNA PROBE: NEGATIVE
Neisseria gonorrhoeae by PCR: NEGATIVE

## 2018-01-30 LAB — URINE CULTURE

## 2018-02-03 ENCOUNTER — Encounter: Payer: Self-pay | Admitting: Obstetrics and Gynecology

## 2018-02-03 LAB — MONITOR DRUG PROFILE 14(MW)
AMPHETAMINE SCREEN URINE: NEGATIVE ng/mL
BENZODIAZEPINE SCREEN, URINE: NEGATIVE ng/mL
Buprenorphine, Urine: NEGATIVE ng/mL
CANNABINOIDS UR QL SCN: NEGATIVE ng/mL
Cocaine (Metab) Scrn, Ur: NEGATIVE ng/mL
Creatinine(Crt), U: 148.4 mg/dL (ref 20.0–300.0)
FENTANYL, URINE: NEGATIVE pg/mL
Meperidine Screen, Urine: NEGATIVE ng/mL
Methadone Screen, Urine: NEGATIVE ng/mL
OPIATE SCREEN URINE: NEGATIVE ng/mL
OXYCODONE+OXYMORPHONE UR QL SCN: NEGATIVE ng/mL
PROPOXYPHENE SCREEN URINE: NEGATIVE ng/mL
Ph of Urine: 5.8 (ref 4.5–8.9)
Phencyclidine Qn, Ur: NEGATIVE ng/mL
SPECIFIC GRAVITY: 1.021
TRAMADOL SCREEN, URINE: NEGATIVE ng/mL

## 2018-02-03 LAB — NICOTINE SCREEN, URINE: COTININE UR QL SCN: NEGATIVE ng/mL

## 2018-02-03 LAB — BARBITURATE (GC/MS), URINE
AMOBARBITAL: NEGATIVE
BARBITURATES: POSITIVE — AB
BUTALBITAL: POSITIVE — AB
Butalbital: 480 ng/mL
PENTOBARBITAL: NEGATIVE
PHENOBARBITAL: NEGATIVE
SECOBARBITAL: NEGATIVE

## 2018-02-04 ENCOUNTER — Other Ambulatory Visit

## 2018-02-04 DIAGNOSIS — R300 Dysuria: Secondary | ICD-10-CM

## 2018-02-06 LAB — URINE CULTURE

## 2018-02-14 ENCOUNTER — Encounter: Payer: Self-pay | Admitting: Obstetrics and Gynecology

## 2018-02-15 ENCOUNTER — Telehealth: Payer: Self-pay | Admitting: Obstetrics and Gynecology

## 2018-02-15 NOTE — Telephone Encounter (Signed)
The patient called and stated that she would like to speak with Amy or Melody in regards to her needing to go over her results. Please advise.

## 2018-02-16 ENCOUNTER — Encounter: Payer: Self-pay | Admitting: Obstetrics and Gynecology

## 2018-02-18 ENCOUNTER — Ambulatory Visit (INDEPENDENT_AMBULATORY_CARE_PROVIDER_SITE_OTHER): Admitting: Obstetrics and Gynecology

## 2018-02-18 ENCOUNTER — Encounter: Payer: Self-pay | Admitting: Obstetrics and Gynecology

## 2018-02-18 VITALS — BP 103/58 | HR 82 | Wt 147.2 lb

## 2018-02-18 DIAGNOSIS — Z3491 Encounter for supervision of normal pregnancy, unspecified, first trimester: Secondary | ICD-10-CM | POA: Diagnosis not present

## 2018-02-18 LAB — POCT URINALYSIS DIPSTICK
BILIRUBIN UA: NEGATIVE
Blood, UA: NEGATIVE
GLUCOSE UA: NEGATIVE
KETONES UA: NEGATIVE
Leukocytes, UA: NEGATIVE
Nitrite, UA: NEGATIVE
Protein, UA: NEGATIVE
Spec Grav, UA: 1.01 (ref 1.010–1.025)
UROBILINOGEN UA: 0.2 U/dL
pH, UA: 6.5 (ref 5.0–8.0)

## 2018-02-18 NOTE — Progress Notes (Signed)
NEW OB HISTORY AND PHYSICAL  SUBJECTIVE:       Stephanie Logan is a 27 y.o. [phone] female, Patient's last menstrual period was 08/20/2017., Estimated Date of Delivery: 09/07/18, [redacted]w[redacted]d, presents today for establishment of Prenatal Care. She has no unusual complaints and complains of headache and thirsty, taking butalbital (previous prescription)      Gynecologic History Patient's last menstrual period was 08/20/2017. Normal Contraception: none Last Pap: 05/2017. Results were: normal  Obstetric History OB History  Gravida Para Term Preterm AB Living  5 0 0 0 1 2  SAB TAB Ectopic Multiple Live Births  1 0 0 0 2    # Outcome Date GA Lbr Len/2nd Weight Sex Delivery Anes PTL Lv  5 Current           4 Gravida 01/27/15    M Vag-Spont   LIV  3 SAB 01/2014        ND  2 Gravida 01/19/13    M Vag-Spont   LIV     Complications: Anal sphincter tear in delivery without 3rd degree perineal laceration  1 Gravida             Past Medical History:  Diagnosis Date  . Amenorrhea   . Anxiety   . PCOS (polycystic ovarian syndrome)   . Post partum depression     Past Surgical History:  Procedure Laterality Date  . BREAST FIBROADENOMA SURGERY  2008,2010    Current Outpatient Medications on File Prior to Visit  Medication Sig Dispense Refill  . butalbital-acetaminophen-caffeine (FIORICET WITH CODEINE) 50-325-40-30 MG capsule Take 1 capsule by mouth every 4 (four) hours as needed for headache.    . sertraline (ZOLOFT) 50 MG tablet TAKE 1 TABLET(50 MG) BY MOUTH DAILY 30 tablet 4  . ALPRAZolam (XANAX) 0.5 MG tablet Take 1 tablet (0.5 mg total) by mouth 2 (two) times daily as needed for anxiety. (Patient not taking: Reported on 02/18/2018) 60 tablet 2  . clonazePAM (KLONOPIN) 1 MG tablet Take 1 tablet (1 mg total) by mouth 3 (three) times daily as needed (anxiety). (Patient not taking: Reported on 06/17/2017) 30 tablet 0  . ibuprofen (ADVIL,MOTRIN) 800 MG tablet Take 1 tablet (800 mg total) by  mouth every 8 (eight) hours as needed. (Patient not taking: Reported on 02/18/2018) 30 tablet 0  . Iron-FA-B Cmp-C-Biot-Probiotic (FUSION PLUS) CAPS Take 1 capsule by mouth daily. (Patient not taking: Reported on 02/18/2018) 60 capsule 1  . triazolam (HALCION) 0.125 MG tablet Take 1 tablet (0.125 mg total) by mouth at bedtime as needed for sleep. (Patient not taking: Reported on 06/17/2017) 30 tablet 2   No current facility-administered medications on file prior to visit.     Allergies  Allergen Reactions  . Ciprofloxacin Rash    Social History   Socioeconomic History  . Marital status: Married    Spouse name: Not on file  . Number of children: Not on file  . Years of education: Not on file  . Highest education level: Not on file  Occupational History  . Not on file  Social Needs  . Financial resource strain: Not on file  . Food insecurity:    Worry: Not on file    Inability: Not on file  . Transportation needs:    Medical: Not on file    Non-medical: Not on file  Tobacco Use  . Smoking status: Never Smoker  . Smokeless tobacco: Never Used  Substance and Sexual Activity  . Alcohol use: Yes  Comment: occas  . Drug use: No  . Sexual activity: Yes    Birth control/protection: None  Lifestyle  . Physical activity:    Days per week: Not on file    Minutes per session: Not on file  . Stress: Not on file  Relationships  . Social connections:    Talks on phone: Not on file    Gets together: Not on file    Attends religious service: Not on file    Active member of club or organization: Not on file    Attends meetings of clubs or organizations: Not on file    Relationship status: Not on file  . Intimate partner violence:    Fear of current or ex partner: Not on file    Emotionally abused: Not on file    Physically abused: Not on file    Forced sexual activity: Not on file  Other Topics Concern  . Not on file  Social History Narrative  . Not on file    Family  History  Problem Relation Age of Onset  . Cancer Maternal Grandmother        brain  . Cancer Mother        cervical, precancerous  . Seizures Sister     The following portions of the patient's history were reviewed and updated as appropriate: allergies, current medications, past OB history, past medical history, past surgical history, past family history, past social history, and problem list.    OBJECTIVE: Initial Physical Exam (New OB)  GENERAL APPEARANCE: alert, well appearing, in no apparent distress, oriented to person, place and time HEAD: normocephalic, atraumatic MOUTH: mucous membranes moist, pharynx normal without lesions and dental hygiene good THYROID: no thyromegaly or masses present BREASTS: not examined LUNGS: clear to auscultation, no wheezes, rales or rhonchi, symmetric air entry HEART: regular rate and rhythm, no murmurs ABDOMEN: soft, nontender, nondistended, no abnormal masses, no epigastric pain, fundus not palpable and FHT present EXTREMITIES: no redness or tenderness in the calves or thighs SKIN: normal coloration and turgor, no rashes LYMPH NODES: no adenopathy palpable NEUROLOGIC: alert, oriented, normal speech, no focal findings or movement disorder noted  PELVIC EXAM deferred  ASSESSMENT: Normal pregnancy  PLAN: Prenatal care See orders

## 2018-02-18 NOTE — Patient Instructions (Signed)
Second Trimester of Pregnancy The second trimester is from week 13 through week 28, month 4 through 6. This is often the time in pregnancy that you feel your best. Often times, morning sickness has lessened or quit. You may have more energy, and you may get hungry more often. Your unborn baby (fetus) is growing rapidly. At the end of the sixth month, he or she is about 9 inches long and weighs about 1 pounds. You will likely feel the baby move (quickening) between 18 and 20 weeks of pregnancy. Follow these instructions at home:  Avoid all smoking, herbs, and alcohol. Avoid drugs not approved by your doctor.  Do not use any tobacco products, including cigarettes, chewing tobacco, and electronic cigarettes. If you need help quitting, ask your doctor. You may get counseling or other support to help you quit.  Only take medicine as told by your doctor. Some medicines are safe and some are not during pregnancy.  Exercise only as told by your doctor. Stop exercising if you start having cramps.  Eat regular, healthy meals.  Wear a good support bra if your breasts are tender.  Do not use hot tubs, steam rooms, or saunas.  Wear your seat belt when driving.  Avoid raw meat, uncooked cheese, and liter boxes and soil used by cats.  Take your prenatal vitamins.  Take 1500-2000 milligrams of calcium daily starting at the 20th week of pregnancy until you deliver your baby.  Try taking medicine that helps you poop (stool softener) as needed, and if your doctor approves. Eat more fiber by eating fresh fruit, vegetables, and whole grains. Drink enough fluids to keep your pee (urine) clear or pale yellow.  Take warm water baths (sitz baths) to soothe pain or discomfort caused by hemorrhoids. Use hemorrhoid cream if your doctor approves.  If you have puffy, bulging veins (varicose veins), wear support hose. Raise (elevate) your feet for 15 minutes, 3-4 times a day. Limit salt in your diet.  Avoid heavy  lifting, wear low heals, and sit up straight.  Rest with your legs raised if you have leg cramps or low back pain.  Visit your dentist if you have not gone during your pregnancy. Use a soft toothbrush to brush your teeth. Be gentle when you floss.  You can have sex (intercourse) unless your doctor tells you not to.  Go to your doctor visits. Get help if:  You feel dizzy.  You have mild cramps or pressure in your lower belly (abdomen).  You have a nagging pain in your belly area.  You continue to feel sick to your stomach (nauseous), throw up (vomit), or have watery poop (diarrhea).  You have bad smelling fluid coming from your vagina.  You have pain with peeing (urination). Get help right away if:  You have a fever.  You are leaking fluid from your vagina.  You have spotting or bleeding from your vagina.  You have severe belly cramping or pain.  You lose or gain weight rapidly.  You have trouble catching your breath and have chest pain.  You notice sudden or extreme puffiness (swelling) of your face, hands, ankles, feet, or legs.  You have not felt the baby move in over an hour.  You have severe headaches that do not go away with medicine.  You have vision changes. This information is not intended to replace advice given to you by your health care provider. Make sure you discuss any questions you have with your health care   provider. Document Released: 12/02/2009 Document Revised: 02/13/2016 Document Reviewed: 11/08/2012 Elsevier Interactive Patient Education  2017 [employer]vier Inc.  

## 2018-02-18 NOTE — Progress Notes (Signed)
NOB PE- pt is doing ok, states last several days depression has been bad, she has been very thirsty as well

## 2018-02-24 ENCOUNTER — Encounter: Payer: Self-pay | Admitting: Obstetrics and Gynecology

## 2018-02-28 ENCOUNTER — Encounter (INDEPENDENT_AMBULATORY_CARE_PROVIDER_SITE_OTHER): Payer: Self-pay

## 2018-02-28 ENCOUNTER — Other Ambulatory Visit: Payer: Self-pay | Admitting: Certified Nurse Midwife

## 2018-02-28 DIAGNOSIS — R898 Other abnormal findings in specimens from other organs, systems and tissues: Secondary | ICD-10-CM

## 2018-02-28 NOTE — Progress Notes (Signed)
Panorama test results show no results due to uninformative DNA pattern. A repeat sample is not recommended.    Stephanie Logan, CNM

## 2018-03-01 NOTE — Telephone Encounter (Signed)
The patient called a little bit ago requesting her results. Good call back number is [phone].  Please advise, thanks.

## 2018-03-10 ENCOUNTER — Ambulatory Visit

## 2018-03-10 ENCOUNTER — Ambulatory Visit
Admission: RE | Admit: 2018-03-10 | Discharge: 2018-03-10 | Disposition: A | Discharge status: Home / Self Care | Source: Ambulatory Visit | Attending: Maternal and Fetal Medicine | Admitting: Maternal and Fetal Medicine

## 2018-03-10 VITALS — BP 115/64 | HR 82 | Temp 97.9°F | Resp 18 | Ht 63.6 in | Wt 144.2 lb

## 2018-03-10 DIAGNOSIS — O364XX0 Maternal care for intrauterine death, not applicable or unspecified: Secondary | ICD-10-CM

## 2018-03-10 NOTE — Progress Notes (Signed)
Referring MD:     [facility]      Joylene Igo, CNM Length of consultation:  45 minutes  Stephanie Logan and her husband, Stephanie Logan, were referred for genetic counseling to discuss the results of her prenatal cell-free DNA screening (Panorama), risks to the pregnancy and available prenatal testing options.  The following is a summary of our discussion.  To review, Stephanie Logan is a 27 year old, [id number] woman of [place] ancestry who elected to have prenatal cell-free DNA screening for the purposes of gender prediction.  Stephanie Logan has a history of depression but otherwise reported to be in good health.  She is up to date with her pap smears, which were reported as normal.  Screening for common aneuploidies, Triploidy and microdeletions was chosen.  Blood was collected on 02/16/18 and results were reported out on 02/25/18 as follows:  "This sample did not produce a result due to an uninformative DNA pattern.  A repeat sample is not recommended.  Uninformative DNA patterns may be associated with an increased risk for chromosomal abnormality.  Therefore, genetic counseling with the option of comprehensive ultrasound evaluation and diagnostic testing should be considered ([place] [phone]).  A small number of individuals have a DNA pattern that cannot be interpreted clearly with this assay.  A repeat specimen will not give addition information."   Pregnancy and Family History:  A detailed pregnancy and family history were obtained.  The pregnancy is reported as uncomplicated.  No history of bleeding or infection were reported.  No unusual exposures were reported. On Feb 07, 2017 the patient presented to her Ob at [redacted] weeks gestation with decreased fetal movement.  Ultrasound evaluation revealed no fetal heartbeat and the patient underwent induction of labor of a stillborn female (named "Angelia Mould") who was noted to have a nuchal cord x3 with knot in cord on second loop.  A three  vessel cord was noted.  Parents did not note any obvious malformations or birth defects.  No karyotype, microarray and/or autopsy were performed.  Prenatal cell-free DNA screening (MaterniT21 Plus) was performed and was reported as negative.  Medical documentation reviewed in [place] reports a history of preterm labor affecting pregnancy.  A BPP with Non-Stress Test was performed  Feb 05, 2017 (two days prior to demise).  Impression is as follows:  1. 33 week 3 day Viable Singleton Intrauterine pregnancy by U/S. 2. (U/S) EDD is consistent with Clinically established (LMP) EDD of 03/13/17. 3. BPP: 4/8 for no fetal breathing movements and Polyhydramnios 4. Umbilical artery dopplers appear WNL. 5. Right fetal renal pelviectasis at 9.4 mm ( > 7 mm in 3rd trimester is considered abnormal.). 6. Fetal stomach is not seen during entire BPP and doppler scan. 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  The patient's family history includes a 90 year old son who has sensory integration disorder.  The question of autism spectrum disorder has been raised but he has not been evaluated.  This son was reported to present in the newborn period with unilateral ptosis and ipsilateral hand posturing of the hand.  His differential diagnosis includes the possibility of inutero stroke but the couple have elected not to pursue MRI at this time.  He is otherwise reported to be in good health and intellectually appropriate.  If the diagnosis of autism is suspected, we encouraged the couple to consider testing for Fragile X.  Alternatively, carrier screening for Fragile X could be offered to Ms.  Logan.  If she is not a carrier, her son's presentation would not be expected to be the result of Fragile X and future children would not be at risk for this condition.  Finally, the patient's brother has a two year old son who was born with transposition of the great vessels.  He is otherwise reported to be  in good health and the congenital heart defect appears to be isolated in nature.  We reviewed multifactorial inheritance as well as genetic conditions that have heart defects as a feature.  At this time, we suspect that this is an isolated event of multifactorial inheritance and is not expected to increase the patient's risk for a similar condition.  If, however, additional information is obtained, we would be happy to review the risk assessment.  The remainder of the family history is otherwise unremarkable for a history of cancer, venothromboembolism, recurrent pregnancy loss, developmental disabilities or other known genetic conditions.  Consanguinity was denied.  Carrier screening for both Cystic Fibrosis and Spinal Muscular Atrophy were declined.    Management Plan:   We reviewed the risks, benefits and limitations of prenatal cell-free DNA screening and available prenatal screening/testing options including detailed fetal anatomy and amniocentesis.  Based upon the results, we cannot exclude the possibility of a fetal aneuploidy and therefore amniocentesis was offered to assess the fetal karyotype.  The couple feel strongly that termination of the pregnancy is not an option, regardless of diagnosis and/or prognosis but would consider amniocentesis following a detailed second trimester ultrasound.    At this time, it is difficult to predict whether the history of stillbirth could impact the risk for aneuploidy in this pregnancy.  If their daughter had a chromosome abnormality, most of these events are sporadic in nature, a result of a random error in the formation of an egg or a sperm.  When a chromosome abnormality occurs, the risk for recurrence in a future pregnancy is estimated to be 1% or mom's age-related risk, whichever is higher.  At 27 years, that risk would be estimated to be 1%.  The couple had asked whether DNA from that pregnancy could continue to circulate and whether this could explain the  uninformative prenatal cell-free DNA result.  I informed them that any remaining DNA from the pregnancy would be unmeasurable and not expected to be the cause for the lab result.  Finally, although the patient's personal and family history for VTE is negative, the possibility of a thrombophilia work-up could be considered as well as referral to our MFM consult clinic to review the history and discuss management plan.  Finally, in a small number of cases where patients receive an uninformative prenatal cell-free DNA result of mixed aneuploidy, there may be an increased risk for maternal malignancy. Patient's should make their Ob/Gyn and/or PCP aware of changes to health and referral to Medical Oncologist should be considered.  For reference, please refer to the following publication: Maternal Malignancy Evaluation After Discordant Cell-Free DNA Results", Obstet Gynecol 2018;0:1-5.  Today, we performed a bedside viability scan.  Fetal cardiac activity was noted.  A second trimester detailed ultrasound was scheduled in our [place] at [redacted] weeks gestation.  We have made available the option of amniocentesis if the patient chooses to have one performed that day.  Any information obtained during the pregnancy would be used for information and preparation purposes only.  Termination of the pregnancy is not an option, as stated by the couple.  Stephanie Logan and her  husband were encouraged to contact us as questions or concerns arise.    I spoke today with the patient's midwife and discussed the referral recommendation above. They will contact the patient and complete a pap smear. Her CBC is within normal limits.  I was present and participated in the genetic counseling performed by Mardella Layman MD

## 2018-03-10 NOTE — Progress Notes (Signed)
Appointment made at the [facility] for Detailed [place] with GC following on July 9th at 930am.  PAtient aware of date and time.

## 2018-03-15 ENCOUNTER — Encounter: Payer: Self-pay | Admitting: Certified Nurse Midwife

## 2018-03-15 ENCOUNTER — Telehealth: Payer: Self-pay | Admitting: Certified Nurse Midwife

## 2018-03-15 DIAGNOSIS — R898 Other abnormal findings in specimens from other organs, systems and tissues: Secondary | ICD-10-CM

## 2018-03-15 DIAGNOSIS — Z3492 Encounter for supervision of normal pregnancy, unspecified, second trimester: Secondary | ICD-10-CM

## 2018-03-15 NOTE — Telephone Encounter (Signed)
Telephone call to patient, HIPPA approved message left on identified line. Will attempt to call back.

## 2018-03-15 NOTE — Telephone Encounter (Signed)
[place] Telephone call received for Dr. Ysidro Evert, [facility].   Reports uniformative DNA pattern associated with maternal malignancy. Recommends referral to Medical Oncologist, Luz Brazen. Send patient with update Pap smear and CBC. If no malignancy found, then yearly screening needed.   Notified Dr Amalia Hailey of MFM referrals and agrees with plan of care. Advised no recollection of Pap at this time since normal 05/2017.   Will refer as advised. Plan to contact patient for follow up and notification of referral completion.    Diona Fanti, CNM [facility], [facility]

## 2018-03-17 ENCOUNTER — Ambulatory Visit (INDEPENDENT_AMBULATORY_CARE_PROVIDER_SITE_OTHER): Admitting: Certified Nurse Midwife

## 2018-03-17 ENCOUNTER — Encounter: Payer: Self-pay | Admitting: Obstetrics and Gynecology

## 2018-03-17 VITALS — BP 94/62 | HR 98 | Wt 145.3 lb

## 2018-03-17 DIAGNOSIS — Z3492 Encounter for supervision of normal pregnancy, unspecified, second trimester: Secondary | ICD-10-CM | POA: Diagnosis not present

## 2018-03-17 LAB — POCT URINALYSIS DIPSTICK
BILIRUBIN UA: NEGATIVE
Blood, UA: NEGATIVE
GLUCOSE UA: NEGATIVE
KETONES UA: NEGATIVE
Leukocytes, UA: NEGATIVE
NITRITE UA: NEGATIVE
Protein, UA: NEGATIVE
SPEC GRAV UA: 1.02 (ref 1.010–1.025)
Urobilinogen, UA: 0.2 E.U./dL
pH, UA: 7 (ref 5.0–8.0)

## 2018-03-17 NOTE — Patient Instructions (Signed)
Common Medications Safe in Pregnancy  Acne:      Constipation:  Benzoyl Peroxide     Colace  Clindamycin      Dulcolax Suppository  [place]ica Erythromycin     Fibercon  Salicylic Acid      Metamucil         Miralax AVOID:        Senakot   Accutane    Cough:  Retin-A       Cough Drops  Tetracycline      Phenergan w/ Codeine if Rx  Minocycline      Robitussin (Plain & DM)  Antibiotics:     Crabs/Lice:  Ceclor       RID  Cephalosporins    AVOID:  E-Mycins      Kwell  Keflex  Macrobid/Macrodantin   Diarrhea:  Penicillin      Kao-Pectate  Zithromax      Imodium AD         PUSH FLUIDS AVOID:       Cipro     Fever:  Tetracycline      Tylenol (Regular or Extra  Minocycline       Strength)  Levaquin      Extra Strength-Do not          Exceed 8 tabs/24 hrs Caffeine:        <200mg/day (equiv. To 1 cup of coffee or  approx. 3 12 oz sodas)         Gas: Cold/Hayfever:       Gas-X  Benadryl      Mylicon  Claritin       Phazyme  **Claritin-D        Chlor-Trimeton    Headaches:  Dimetapp      ASA-Free Excedrin  Drixoral-Non-Drowsy     Cold Compress  Mucinex (Guaifenasin)     Tylenol (Regular or Extra  Sudafed/Sudafed-12 Hour     Strength)  **Sudafed PE Pseudoephedrine   Tylenol Cold & Sinus     Vicks Vapor Rub  Zyrtec  **AVOID if Problems With Blood Pressure         Heartburn: Avoid lying down for at least 1 hour after meals  Aciphex      Maalox     Rash:  Milk of Magnesia     Benadryl    Mylanta       1% Hydrocortisone Cream  Pepcid  Pepcid Complete   Sleep Aids:  Prevacid      Ambien   Prilosec       Benadryl  Rolaids       Chamomile Tea  Tums (Limit 4/day)     Unisom  Zantac       Tylenol PM         Warm milk-add vanilla or  Hemorrhoids:       Sugar for taste  Anusol/Anusol H.C.  (RX: Analapram 2.5%)  Sugar Substitutes:  Hydrocortisone OTC     Ok in moderation  Preparation H      Tucks        Vaseline lotion applied to tissue with  wiping    Herpes:     Throat:  Acyclovir      Oragel  Famvir  Valtrex     Vaccines:         Flu Shot Leg Cramps:       *Gardasil  Benadryl      Hepatitis A         Hepatitis B Nasal Spray:         Pneumovax  Saline Nasal Spray     Polio Booster         Tetanus Nausea:       Tuberculosis test or PPD  Vitamin B6 25 mg TID   AVOID:    Dramamine      *Gardasil  Emetrol       Live Poliovirus  Ginger Root 250 mg QID    MMR (measles, mumps &  High Complex Carbs @ Bedtime    rebella)  Sea Bands-Accupressure    Varicella (Chickenpox)  Unisom 1/2 tab TID     *No known complications           If received before Pain:         Known pregnancy;   Darvocet       Resume series after  Lortab        Delivery  Percocet    Yeast:   Tramadol      Femstat  Tylenol 3      Gyne-lotrimin  Ultram       Monistat  Vicodin           MISC:         All Sunscreens           Hair Coloring/highlights          Insect Repellant's          (Including DEET)         Mystic Tans Back Pain in Pregnancy Back pain during pregnancy is common. Back pain may be caused by several factors that are related to changes during your pregnancy. Follow these instructions at home: Managing pain, stiffness, and swelling  If directed, apply ice for sudden (acute) back pain. ? Put ice in a plastic bag. ? Place a towel between your skin and the bag. ? Leave the ice on for 20 minutes, 2-3 times per day.  If directed, apply heat to the affected area before you exercise: ? Place a towel between your skin and the heat pack or heating pad. ? Leave the heat on for 20-30 minutes. ? Remove the heat if your skin turns bright red. This is especially important if you are unable to feel pain, heat, or cold. You may have a greater risk of getting burned. Activity  Exercise as told by your health care provider. Exercising is the best way to prevent or manage back pain.  Listen to your body when lifting. If lifting hurts, ask for help or  bend your knees. This uses your leg muscles instead of your back muscles.  Squat down when picking up something from the floor. Do not bend over.  Only use bed rest as told by your health care provider. Bed rest should only be used for the most severe episodes of back pain. Standing, Sitting, and Lying Down  Do not stand in one place for long periods of time.  Use good posture when sitting. Make sure your head rests over your shoulders and is not hanging forward. Use a pillow on your lower back if necessary.  Try sleeping on your side, preferably the left side, with a pillow or two between your legs. If you are sore after a night's rest, your bed may be too soft. A firm mattress may provide more support for your back during pregnancy. General instructions  Do not wear high heels.  Eat a healthy diet. Try to gain weight within your health care provider's recommendations.  Use a maternity girdle, elastic sling, or   back brace as told by your health care provider.  Take over-the-counter and prescription medicines only as told by your health care provider.  Keep all follow-up visits as told by your health care provider. This is important. This includes any visits with any specialists, such as a physical therapist. Contact a health care provider if:  Your back pain interferes with your daily activities.  You have increasing pain in other parts of your body. Get help right away if:  You develop numbness, tingling, weakness, or problems with the use of your arms or legs.  You develop severe back pain that is not controlled with medicine.  You have a sudden change in bowel or bladder control.  You develop shortness of breath, dizziness, or you faint.  You develop nausea, vomiting, or sweating.  You have back pain that is a rhythmic, cramping pain similar to labor pains. Labor pain is usually 1-2 minutes apart, lasts for about 1 minute, and involves a bearing down feeling or pressure in  your pelvis.  You have back pain and your water breaks or you have vaginal bleeding.  You have back pain or numbness that travels down your leg.  Your back pain developed after you fell.  You develop pain on one side of your back.  You see blood in your urine.  You develop skin blisters in the area of your back pain. This information is not intended to replace advice given to you by your health care provider. Make sure you discuss any questions you have with your health care provider. Document Released: 12/16/2005 Document Revised: 02/13/2016 Document Reviewed: 05/22/2015 Elsevier Interactive Patient Education  2018 [employer]. Round Ligament Pain The round ligament is a cord of muscle and tissue that helps to support the uterus. It can become a source of pain during pregnancy if it becomes stretched or twisted as the baby grows. The pain usually begins in the second trimester of pregnancy, and it can come and go until the baby is delivered. It is not a serious problem, and it does not cause harm to the baby. Round ligament pain is usually a short, sharp, and pinching pain, but it can also be a dull, lingering, and aching pain. The pain is felt in the lower side of the abdomen or in the groin. It usually starts deep in the groin and moves up to the outside of the hip area. Pain can occur with:  A sudden change in position.  Rolling over in bed.  Coughing or sneezing.  Physical activity.  Follow these instructions at home: Watch your condition for any changes. Take these steps to help with your pain:  When the pain starts, relax. Then try: ? Sitting down. ? Flexing your knees up to your abdomen. ? Lying on your side with one pillow under your abdomen and another pillow between your legs. ? Sitting in a warm bath for 15-20 minutes or until the pain goes away.  Take over-the-counter and prescription medicines only as told by your health care provider.  Move slowly when you sit  and stand.  Avoid long walks if they cause pain.  Stop or lessen your physical activities if they cause pain.  Contact a health care provider if:  Your pain does not go away with treatment.  You feel pain in your back that you did not have before.  Your medicine is not helping. Get help right away if:  You develop a fever or chills.  You develop uterine contractions.  You develop vaginal bleeding.  You develop nausea or vomiting.  You develop diarrhea.  You have pain when you urinate. This information is not intended to replace advice given to you by your health care provider. Make sure you discuss any questions you have with your health care provider. Document Released: 06/16/2008 Document Revised: 02/13/2016 Document Reviewed: 11/14/2014 Elsevier Interactive Patient Education  [employer].

## 2018-03-17 NOTE — Progress Notes (Signed)
Pt is here for an [place] visit.

## 2018-03-22 ENCOUNTER — Other Ambulatory Visit: Payer: Self-pay | Admitting: Obstetrics and Gynecology

## 2018-03-22 NOTE — Progress Notes (Signed)
ROB-Doing well. Completed MFM consult, scheduled to have detailed anatomy scan on July 17. Referral to Medical Oncology per recommendations, see orders. Reviewed red flag symptoms and when to call. RTC x 4-5 weeks for ROB or sooner if needed.

## 2018-04-01 ENCOUNTER — Encounter: Payer: Self-pay | Admitting: Certified Nurse Midwife

## 2018-04-06 ENCOUNTER — Encounter: Payer: Self-pay | Admitting: Certified Nurse Midwife

## 2018-04-06 ENCOUNTER — Telehealth: Payer: Self-pay | Admitting: Certified Nurse Midwife

## 2018-04-06 NOTE — Telephone Encounter (Signed)
Stephanie Logan, I sent a message to one of the nurses asking them to fax Dr. Luz Brazen the records that he requested. He requested records not a conversation. Please check and see if the information was sent to him. Thanks, JML

## 2018-04-06 NOTE — Telephone Encounter (Signed)
Patient called in regards to her referral. I sent the referral to Dr Luz Brazen whom I believe called Sharyn Lull to ask her some things regarding the patient before he could schedule her. Im not sure if she ever spoke with him. Thanks

## 2018-04-07 NOTE — Telephone Encounter (Signed)
I tried to call the office yesterday but was on hold forever. I hung up and called back and just talk with an Administrative assistance and left a message.   Thanks,  Deneise Lever

## 2018-04-07 NOTE — Telephone Encounter (Signed)
It appears Stephanie Logan reached out to the office yesterday and left a message. Thanks, JML

## 2018-04-25 ENCOUNTER — Ambulatory Visit (INDEPENDENT_AMBULATORY_CARE_PROVIDER_SITE_OTHER): Admitting: Certified Nurse Midwife

## 2018-04-25 ENCOUNTER — Other Ambulatory Visit: Payer: Self-pay | Admitting: Certified Nurse Midwife

## 2018-04-25 ENCOUNTER — Encounter: Payer: Self-pay | Admitting: Certified Nurse Midwife

## 2018-04-25 VITALS — BP 99/58 | HR 85 | Wt 153.4 lb

## 2018-04-25 DIAGNOSIS — Z3492 Encounter for supervision of normal pregnancy, unspecified, second trimester: Secondary | ICD-10-CM

## 2018-04-25 DIAGNOSIS — R898 Other abnormal findings in specimens from other organs, systems and tissues: Secondary | ICD-10-CM

## 2018-04-25 LAB — POCT URINALYSIS DIPSTICK
BILIRUBIN UA: NEGATIVE
Blood, UA: NEGATIVE
GLUCOSE UA: NEGATIVE
KETONES UA: NEGATIVE
Leukocytes, UA: NEGATIVE
Nitrite, UA: NEGATIVE
Protein, UA: NEGATIVE
Spec Grav, UA: 1.01 (ref 1.010–1.025)
Urobilinogen, UA: 0.2 E.U./dL
pH, UA: 7 (ref 5.0–8.0)

## 2018-04-25 NOTE — Patient Instructions (Signed)

## 2018-04-25 NOTE — Progress Notes (Signed)
Stephanie Logan, pt doing well. Feels good movement. She has not heard from medical oncologist. Will follow up with Dr. Annabelle Harman office. She state she had anatomy u/s at [facility]. ( See below). Stephanie Logan 4 wks   Philip Aspen, CNM    [place] [facility] detail fetal anatomy single or first gestation7/17/2019 [place] Other Result Information  This result has an attachment that is not available.  Result Narrative   Results ======  Note: THE PLAIN TEXT VERSION OF THIS REPORT IN MAESTRO MAY BE MISSING INFORMATION AND IS NOT THE FINAL FORMAL REPORT. To view the official copy of the report, including any embedded images or graphs, click the scanned pdf link (under "Results" header below plain text version).  Indication ========  Anatomy. uninformative Cell free fetal DNA results uninformative.  Pregnancy =========  Singleton pregnancy. Number of fetuses: 1.  Dating ======   DateDetails Gest. age EDD External assessment4/26/2019 GA: 6 w + 2 d, by [facility]18 w + 0 d12/18/2019 U/S7/17/2019 based upon BPD, Femur, HC, Humerus18 w + 0 d12/18/2019  Fetal Biometry ============  Main Fetal Biometry: BPD39.71mm17w 6dHadlock HC 150.1 mm18w 0dHadlock Femur26.48mm18w 0dJeanty Humerus26.70mm18w 3dJeanty  Maternal Structures ===============  Cervical length is 4.9 cm.  General Evaluation ==============  Cardiac activity: Present. Presentation: Cephalic. Placenta: Placental site: Posterior., No evidence of previa. Umbilical cord: Cord vessels: 3 vessel cord. Amniotic fluid: Amount of AF: Normal.  Fetal Anatomy ===========  Head / NeckLateral ventricles: Normal. Midline falx: Normal. Cavum septi pellucidi: Normal. Posterior fossa: Normal. Face Lips: Normal. Profile: Normal. Nose: Normal. Orbits: Normal. Heart / Thorax 4-chamber view: Normal. RVOT: Normal. LVOT: Normal.  Aortic arch: Normal. Bicaval view: Normal. 3-vessel-trachea view: Normal. Great  vessels: Normal, (Short axis view at the level of the great vessels).  Right lung: Normal. Left lung: Normal. Diaphragm: Normal. AbdomenCord insertion: Normal. Stomach: Normal. Kidneys: Normal. Bladder: Normal. Genitals: Female. Spine / Skelet.Cervical spine: Normal. Thoracic spine: Normal. Lumbar spine: Normal. Sacral spine: Normal. ExtremitiesArms: Present. Legs: Present. Position of hands: Normal. Position of feet: Normal.  Impression =========  Intrauterine pregnancy with an estimated gestational age of [redacted] weeks 1 day(s). Dating assigned by earliest available ultrasound, performed at [facility] on 01/14/18 ; measurements at that exam reported as 6 weeks 2 day(s).  A detailed exam was performed for the indication of uninformative cell free fetal DNA results.  Cell free fetal DNA screen results: "This sample did not produce a result due to an uninformative DNA pattern. A repeat sample is not recommended. Uninformative DNA patterns may be associated with an increased risk for chromosomal abnormality. Therefore, genetic counseling with the option of comprehensive ultrasound evaluation and diagnostic testing should be considered ([place] [phone]). A small number of individuals have a DNA pattern that cannot be interpreted clearly with this assay. A repeat specimen will not give addition information."  From previous perinatology consultation at [facility]  The patient's family history includes a 30 year old son who has sensory integration disorder. The question of autism spectrum disorder has been raised but he has not been evaluated. This son was reported to present in the newborn period with unilateral ptosis and ipsilateral hand posturing of the hand. His differential diagnosis includes the possibility of in utero stroke but the couple have  elected not to pursue MRI at this time. He is otherwise reported to be in good health and intellectually appropriate. If the diagnosis of autism is suspected, we encouraged the couple to consider testing  for Fragile X. Alternatively, carrier screening for Fragile X could be offered to Ms. Gilardi. If she is not a carrier, her son's presentation would not be expected to be the result of Fragile X and future children would not be at risk for this condition. Finally, the patient's brother has a two year old son who was born with transposition of the great vessels. He is otherwise reported to be in good health and the congenital heart defect appears to be isolated in nature. We reviewed multifactorial inheritance as well as genetic conditions that have heart defects as a feature. At this time, we suspect that this is an isolated event of multifactorial inheritance and is not expected to increase the patient's risk for a similar condition. If, however, additional information is obtained, we would be happy to review the risk assessment.  The remainder of the family history is otherwise unremarkable for a history of cancer, venothromboembolism, recurrent pregnancy loss, developmental disabilities or other known genetic conditions. Consanguinity was denied. Carrier screening for both Cystic Fibrosis and Spinal Muscular Atrophy were declined.   Fetal anatomy visualized appears normal.  Maternal ovaries appear normal bilaterally.  These findings were reviewed.  She had the opportunity to meet with our genetic counselor again today. She declines invasive testing or further screening. She was previously counseled regarding the presence of the uninformative DNA pattern suggestive of mixed aneuploidy in the cell free fetal DNA scren and the potential that these findings represent risk for placental dysfunction, aneuploidy in the fetus or association with maternal malignancy. The risk of these associations are  difficult to quantify and currently represent case reports/case series.  Recommend follow up fetal growth in the third trimester. Antenatal testing weekly can begin ~32 weeks, sooner if clinically indicated. If further counseling regarding the prior loss is desired, we would be happy to see her for consultation in [place].  Other Result Information  Interface, Rad Results In - 04/06/2018  1:10 PM EDT  Results ======  Note: THE PLAIN TEXT VERSION OF THIS REPORT IN MAESTRO MAY BE MISSING INFORMATION AND IS NOT THE FINAL FORMAL REPORT. To view the official copy of the report, including any embedded images or graphs, click the scanned pdf link (under "Results" header below plain text version).  Indication ========  Anatomy. uninformative Cell free fetal DNA results uninformative.  Pregnancy =========  Singleton pregnancy. Number of fetuses: 1.  Dating ======         Date    Details     Gest. age   EDD External assessment    01/14/2018   GA: 6 w + 2 d, by [facility]  18 w + 0 d  09/07/2018 U/S    04/06/2018   based upon BPD, Femur, HC, Humerus      18 w + 0 d  09/07/2018  Fetal Biometry ============  Main Fetal Biometry: BPD    39.1    mm      17w 6d  Hadlock HC     150.1   mm      18w 0d  Hadlock Femur  26.1    mm      18w 0d  Jeanty Humerus    26.9    mm      18w 3d  Jeanty  Maternal Structures ===============  Cervical length is 4.9 cm.  General Evaluation ==============  Cardiac activity: Present. Presentation: Cephalic. Placenta: Placental site: Posterior., No evidence of previa. Umbilical cord: Cord vessels: 3 vessel cord. Amniotic  fluid: Amount of AF: Normal.  Fetal Anatomy ===========  Head / Neck    Lateral ventricles: Normal. Midline falx: Normal. Cavum septi pellucidi: Normal. Posterior fossa: Normal. Face   Lips: Normal. Profile: Normal. Nose: Normal. Orbits: Normal. Heart / Thorax 4-chamber view: Normal. RVOT: Normal. LVOT: Normal.  Aortic arch: Normal. Bicaval view: Normal. 3-vessel-trachea view: Normal. Great        vessels: Normal, (Short axis view at the level of the great vessels).        Right lung: Normal. Left lung: Normal. Diaphragm: Normal. Abdomen    Cord insertion: Normal. Stomach: Normal. Kidneys: Normal. Bladder: Normal. Genitals: Female. Spine / Skelet.    Cervical spine: Normal. Thoracic spine: Normal. Lumbar spine: Normal. Sacral spine: Normal. Extremities    Arms: Present. Legs: Present. Position of hands: Normal. Position of feet: Normal.  Impression =========  Intrauterine pregnancy with an estimated gestational age of [redacted] weeks 1 day(s). Dating assigned by earliest available ultrasound, performed at [facility] on 01/14/18 ; measurements at that exam reported as 6 weeks 2 day(s).  A detailed exam was performed for the indication of uninformative cell free fetal DNA results.  Cell free fetal DNA screen results: "This sample did not produce a result due to an uninformative DNA pattern. A repeat sample is not recommended. Uninformative DNA patterns may be associated with an increased risk for chromosomal abnormality. Therefore, genetic counseling with the option of comprehensive ultrasound evaluation and diagnostic testing should be considered ([place] [id number]). A small number of individuals have a DNA pattern that cannot be interpreted clearly with this assay. A repeat specimen will not give addition information."  From previous perinatology consultation at [facility]  The patient's family history includes a 27 year old son who has sensory integration disorder. The question of autism spectrum disorder has been raised but he has not been evaluated. This son was reported to present in the newborn period with unilateral ptosis and ipsilateral hand posturing of the hand. His differential diagnosis includes the possibility of in utero stroke but the couple have  elected not to pursue MRI at this time. He is otherwise reported to be in good health and intellectually appropriate. If the diagnosis of autism is suspected, we encouraged the couple to consider testing for Fragile X. Alternatively, carrier screening for Fragile X could be offered to Ms. Aigner. If she is not a carrier, her son's presentation would not be expected to be the result of Fragile X and future children would not be at risk for this condition. Finally, the patient's brother has a two year old son who was born with transposition of the great vessels. He is otherwise reported to be in good health and the congenital heart defect appears to be isolated in nature. We reviewed multifactorial inheritance as well as genetic conditions that have heart defects as a feature. At this time, we suspect that this is an isolated event of multifactorial inheritance and is not expected to increase the patient's risk for a similar condition. If, however, additional information is obtained, we would be happy to review the risk assessment.  The remainder of the family history is otherwise unremarkable for a history of cancer, venothromboembolism, recurrent pregnancy loss, developmental disabilities or other known genetic conditions. Consanguinity was denied. Carrier screening for both Cystic Fibrosis and Spinal Muscular Atrophy were declined.   Fetal anatomy visualized appears normal.  Maternal ovaries appear normal bilaterally.  These findings were reviewed.  She had the opportunity  to meet with our genetic counselor again today. She declines invasive testing or further screening. She was previously counseled regarding the presence of the uninformative DNA pattern suggestive of mixed aneuploidy in the cell free fetal DNA scren and the potential that these findings represent risk for placental dysfunction, aneuploidy in the fetus or association with maternal malignancy. The risk of these associations are  difficult to quantify and currently represent case reports/case series.  Recommend follow up fetal growth in the third trimester. Antenatal testing weekly can begin ~32 weeks, sooner if clinically indicated. If further counseling regarding the prior loss is desired, we would be happy to see her for consultation in [place].  Status Results Details   Unavailable

## 2018-04-25 NOTE — Progress Notes (Signed)
Status of Medical Oncology Referral  1029 Telephone call to Dr. Dolan Amen office to discuss status of patient's referral sent in June 2019. Informed that office is a Non-Malignant Hematology office and should contact Medical Oncology for patient referral at [phone].   Recommendations reviewed with Dr. Amalia Hailey. Agreed it was okay to start with local Medical Oncologist if possible.   [place] Dr. Hedwig Morton on call provider for [place]. Discussed patient diagnosis and recommendations from Perinatologist. MD will discuss with other providers in office, but recommends referral to [facility] at this time.   [place] referral placed to Medical Oncology, external [facility]. Notified Referral Specialist, Nydia Bouton, of updated plan of care.    Diona Fanti, CNM [facility], [facility] 04/25/18 11:15 AM

## 2018-04-25 NOTE — Progress Notes (Signed)
Pt is here for an [place] visit. C/o pain right ovary area.

## 2018-04-26 ENCOUNTER — Telehealth: Payer: Self-pay | Admitting: Certified Nurse Midwife

## 2018-04-26 NOTE — Telephone Encounter (Signed)
The patient stated that he husband has strep and would like to know if her can be tested for it since she has been exposed. The patient stated that she is experiencing a sore throat and would like to know if she can be tested in the office or go to an urgent care office. Please advise.

## 2018-04-27 ENCOUNTER — Ambulatory Visit (INDEPENDENT_AMBULATORY_CARE_PROVIDER_SITE_OTHER): Admitting: Certified Nurse Midwife

## 2018-04-27 VITALS — BP 98/66 | HR 104 | Temp 98.3°F | Ht 63.0 in | Wt 153.2 lb

## 2018-04-27 DIAGNOSIS — Z20818 Contact with and (suspected) exposure to other bacterial communicable diseases: Secondary | ICD-10-CM

## 2018-04-27 LAB — POCT RAPID STREP A (OFFICE): Rapid Strep A Screen: NEGATIVE

## 2018-04-27 NOTE — Progress Notes (Signed)
OB at 21 weeks. Last visit 04/25/2018 with AT. Pt presents for strep test only. Pt states her husband was dx this week with strep. This his 2 x in the last 6-8 weeks. Pt has no complaints other than a slight sore throat but she attributes  to sinus drainage. Strep test neg today. Advised good hand hygiene. Clorox wipe door knobs, fridge, and common touched areas at home. NO eating or drinking after husband. If sx develop to contact office or go to urgent care. Pt voices understanding.

## 2018-05-05 ENCOUNTER — Inpatient Hospital Stay: Admitting: Oncology

## 2018-05-12 ENCOUNTER — Telehealth: Payer: Self-pay | Admitting: *Deleted

## 2018-05-12 ENCOUNTER — Inpatient Hospital Stay: Admitting: Oncology

## 2018-05-12 ENCOUNTER — Other Ambulatory Visit: Payer: Self-pay

## 2018-05-12 ENCOUNTER — Telehealth: Payer: Self-pay | Admitting: Certified Nurse Midwife

## 2018-05-12 DIAGNOSIS — Z3492 Encounter for supervision of normal pregnancy, unspecified, second trimester: Secondary | ICD-10-CM

## 2018-05-12 DIAGNOSIS — R898 Other abnormal findings in specimens from other organs, systems and tissues: Secondary | ICD-10-CM

## 2018-05-12 NOTE — Telephone Encounter (Signed)
Called patient and let her know that I work for Dr. Janese Banks and she has an appointment for today. Dr. Janese Banks has spoke to midwife and told her that she is not sure what to do about results, the sensible thing would be to do a scan but that should not be done while she is pregnant. We have reached out for asst with [facility] and once we find out what we should do or where she should go we will let her know. We want to cancel her appt since we do not have any information at this time. She is agreeable with above and I cancelled the appt.

## 2018-05-12 NOTE — Telephone Encounter (Signed)
The patient is stating she is having left ear pain, dizziness and off balance.  She would like a call back about what to do; whether to come in or maybe get some medication.  Please advise, thanks

## 2018-05-13 ENCOUNTER — Encounter: Payer: Self-pay | Admitting: Certified Nurse Midwife

## 2018-05-13 ENCOUNTER — Other Ambulatory Visit: Payer: Self-pay

## 2018-05-13 DIAGNOSIS — R42 Dizziness and giddiness: Secondary | ICD-10-CM

## 2018-05-13 MED ORDER — CEFDINIR 300 MG PO CAPS: 300.0000 mg | ORAL_CAPSULE | Freq: Two times a day (BID) | ORAL | 0 refills | Status: DC

## 2018-05-13 MED ORDER — MECLIZINE HCL 12.5 MG PO TABS: 12.5000 mg | ORAL_TABLET | Freq: Three times a day (TID) | ORAL | 0 refills | Status: DC | PRN

## 2018-05-13 NOTE — Telephone Encounter (Signed)
Please contact patient and see if symptoms persist. May have Rx Meclizine for dizziness and Omnicef if ear pain continues and feels like infection. Thanks, JML

## 2018-05-24 ENCOUNTER — Other Ambulatory Visit: Payer: Self-pay | Admitting: Obstetrics and Gynecology

## 2018-05-24 ENCOUNTER — Telehealth: Payer: Self-pay | Admitting: *Deleted

## 2018-05-24 NOTE — Telephone Encounter (Signed)
Dr. Janese Banks wanted me to call pt and let her know that she saw in computer that patient had been referred to [facility] oncology and if so is she going.  Dr. Janese Banks says that she has spoke to the person that has seen the type of pt that she is and she can see her if she is not going to [facility] but would like to know. Asked her to call me back. Left my direct number

## 2018-05-25 NOTE — Telephone Encounter (Signed)
Patient called back today 9/4 and I spoke with her. The GYN is in the works of getting her an appt with [facility] oncology. Patient had not heard about a date yet so she called gyn office and there referral person is looking into why she does not have appt and is planning to call patient back. She prefers to go to [facility] since [place] and GYN suggested this.

## 2018-05-26 ENCOUNTER — Other Ambulatory Visit: Payer: Self-pay

## 2018-05-26 ENCOUNTER — Observation Stay
Admission: EM | Admit: 2018-05-26 | Discharge: 2018-05-26 | Disposition: A | Discharge status: Home / Self Care | Attending: Certified Nurse Midwife | Admitting: Certified Nurse Midwife

## 2018-05-26 ENCOUNTER — Ambulatory Visit (INDEPENDENT_AMBULATORY_CARE_PROVIDER_SITE_OTHER): Admitting: Certified Nurse Midwife

## 2018-05-26 VITALS — BP 108/68 | HR 88 | Wt 161.9 lb

## 2018-05-26 DIAGNOSIS — Z3A25 25 weeks gestation of pregnancy: Secondary | ICD-10-CM | POA: Insufficient documentation

## 2018-05-26 DIAGNOSIS — Z3492 Encounter for supervision of normal pregnancy, unspecified, second trimester: Secondary | ICD-10-CM

## 2018-05-26 DIAGNOSIS — O26812 Pregnancy related exhaustion and fatigue, second trimester: Secondary | ICD-10-CM

## 2018-05-26 DIAGNOSIS — O4702 False labor before 37 completed weeks of gestation, second trimester: Principal | ICD-10-CM | POA: Insufficient documentation

## 2018-05-26 DIAGNOSIS — O99342 Other mental disorders complicating pregnancy, second trimester: Secondary | ICD-10-CM

## 2018-05-26 DIAGNOSIS — F329 Major depressive disorder, single episode, unspecified: Secondary | ICD-10-CM

## 2018-05-26 DIAGNOSIS — O09292 Supervision of pregnancy with other poor reproductive or obstetric history, second trimester: Secondary | ICD-10-CM | POA: Diagnosis not present

## 2018-05-26 DIAGNOSIS — F32A Depression, unspecified: Secondary | ICD-10-CM

## 2018-05-26 DIAGNOSIS — O26819 Pregnancy related exhaustion and fatigue, unspecified trimester: Secondary | ICD-10-CM

## 2018-05-26 LAB — POCT URINALYSIS DIPSTICK OB
BILIRUBIN UA: NEGATIVE
Leukocytes, UA: NEGATIVE
Nitrite, UA: NEGATIVE
POC,PROTEIN,UA: NEGATIVE
RBC UA: NEGATIVE
Spec Grav, UA: 1.01 (ref 1.010–1.025)
Urobilinogen, UA: 0.2 E.U./dL
pH, UA: 6 (ref 5.0–8.0)

## 2018-05-26 LAB — WET PREP, GENITAL
Clue Cells Wet Prep HPF POC: NONE SEEN
SPERM: NONE SEEN
Trich, Wet Prep: NONE SEEN
YEAST WET PREP: NONE SEEN

## 2018-05-26 LAB — URINALYSIS, ROUTINE W REFLEX MICROSCOPIC
Bilirubin Urine: NEGATIVE
GLUCOSE, UA: NEGATIVE mg/dL
Hgb urine dipstick: NEGATIVE
Ketones, ur: NEGATIVE mg/dL
LEUKOCYTES UA: NEGATIVE
Nitrite: NEGATIVE
PH: 7 (ref 5.0–8.0)
Protein, ur: NEGATIVE mg/dL
Specific Gravity, Urine: 1.005 (ref 1.005–1.030)

## 2018-05-26 LAB — FETAL FIBRONECTIN: Fetal Fibronectin: NEGATIVE

## 2018-05-26 MED ORDER — ACETAMINOPHEN 500 MG PO TABS
ORAL_TABLET | ORAL | Status: AC
  Filled 2018-05-26: qty 2

## 2018-05-26 MED ORDER — SERTRALINE HCL 100 MG PO TABS: 100.0000 mg | ORAL_TABLET | Freq: Every day | ORAL | 0 refills | Status: DC

## 2018-05-26 MED ORDER — HYDROXYZINE HCL 25 MG PO TABS
50.0000 mg | ORAL_TABLET | ORAL | Status: AC
  Administered 2018-05-26: 50 mg via ORAL
  Filled 2018-05-26: qty 2
  Filled 2018-05-26: qty 1

## 2018-05-26 MED ORDER — ACETAMINOPHEN 500 MG PO TABS
1000.0000 mg | ORAL_TABLET | Freq: Four times a day (QID) | ORAL | Status: DC | PRN
  Administered 2018-05-26: 1000 mg via ORAL

## 2018-05-26 NOTE — Patient Instructions (Signed)

## 2018-05-26 NOTE — OB Triage Note (Signed)
Pt. presented to triage with fatigue and intermittent contractions that began around 3 pm. She describes them as like "period cramps" in the lower back and abdomen.She drank water and lay on her left side with no relief of pain. Current pain 4/10. No bleeding or leaking of fluid. Last intercourse a week ago. Positive fetal movement. Vital signs within normal limits. Will continue to monitor.

## 2018-05-26 NOTE — Discharge Instructions (Signed)
Braxton Hicks Contractions °Contractions of the uterus can occur throughout pregnancy, but they are not always a sign that you are in labor. You may have practice contractions called Braxton Hicks contractions. These false labor contractions are sometimes confused with true labor. °What are Braxton Hicks contractions? °Braxton Hicks contractions are tightening movements that occur in the muscles of the uterus before labor. Unlike true labor contractions, these contractions do not result in opening (dilation) and thinning of the cervix. Toward the end of pregnancy (32-34 weeks), Braxton Hicks contractions can happen more often and may become stronger. These contractions are sometimes difficult to tell apart from true labor because they can be very uncomfortable. You should not feel embarrassed if you go to the hospital with false labor. °Sometimes, the only way to tell if you are in true labor is for your health care provider to look for changes in the cervix. The health care provider will do a physical exam and may monitor your contractions. If you are not in true labor, the exam should show that your cervix is not dilating and your water has not broken. °If there are other health problems associated with your pregnancy, it is completely safe for you to be sent home with false labor. You may continue to have Braxton Hicks contractions until you go into true labor. °How to tell the difference between true labor and false labor °True labor °· Contractions last 30-70 seconds. °· Contractions become very regular. °· Discomfort is usually felt in the top of the uterus, and it spreads to the lower abdomen and low back. °· Contractions do not go away with walking. °· Contractions usually become more intense and increase in frequency. °· The cervix dilates and gets thinner. °False labor °· Contractions are usually shorter and not as strong as true labor contractions. °· Contractions are usually irregular. °· Contractions  are often felt in the front of the lower abdomen and in the groin. °· Contractions may go away when you walk around or change positions while lying down. °· Contractions get weaker and are shorter-lasting as time goes on. °· The cervix usually does not dilate or become thin. °Follow these instructions at home: °· Take over-the-counter and prescription medicines only as told by your health care provider. °· Keep up with your usual exercises and follow other instructions from your health care provider. °· Eat and drink lightly if you think you are going into labor. °· If Braxton Hicks contractions are making you uncomfortable: °? Change your position from lying down or resting to walking, or change from walking to resting. °? Sit and rest in a tub of warm water. °? Drink enough fluid to keep your urine pale yellow. Dehydration may cause these contractions. °? Do slow and deep breathing several times an hour. °· Keep all follow-up prenatal visits as told by your health care provider. This is important. °Contact a health care provider if: °· You have a fever. °· You have continuous pain in your abdomen. °Get help right away if: °· Your contractions become stronger, more regular, and closer together. °· You have fluid leaking or gushing from your vagina. °· You pass blood-tinged mucus (bloody show). °· You have bleeding from your vagina. °· You have low back pain that you never had before. °· You feel your baby’s head pushing down and causing pelvic pressure. °· Your baby is not moving inside you as much as it used to. °Summary °· Contractions that occur before labor are called Braxton   Hicks contractions, false labor, or practice contractions. °· Braxton Hicks contractions are usually shorter, weaker, farther apart, and less regular than true labor contractions. True labor contractions usually become progressively stronger and regular and they become more frequent. °· Manage discomfort from Braxton Hicks contractions by  changing position, resting in a warm bath, drinking plenty of water, or practicing deep breathing. °This information is not intended to replace advice given to you by your health care provider. Make sure you discuss any questions you have with your health care provider. °Document Released: 01/21/2017 Document Revised: 01/21/2017 Document Reviewed: 01/21/2017 °Elsevier Interactive Patient Education © 2018 Elsevier Inc. ° °

## 2018-05-26 NOTE — Progress Notes (Signed)
ROB- pt is having bad depression, see PHQ-9 results

## 2018-05-26 NOTE — Progress Notes (Signed)
ROB-Reports fatigue. Requests labs, see orders. Noted increase in depression symptoms, will increase Zoloft to 100 mg. Referral to [facility], see orders. Discussed importance to follow up with Medical Oncology, pt and spouse verbalized understanding. Discussed antenatal testing and POC. Reviewed red flag symptoms and when to call. RTC x 3 weeks for growth [place], 28 week labs and ROB or sooner if needed.   Depression screen [facility] 2/9 05/26/2018 06/17/2017 03/17/2017  Decreased Interest 3 1 2   Down, Depressed, Hopeless 3 1 2   PHQ - 2 Score 6 2 4   Altered sleeping 3 1 2   Tired, decreased energy 3 2 3   Change in appetite 2 1 2   Feeling bad or failure about yourself  2 0 0  Trouble concentrating 3 1 3   Moving slowly or fidgety/restless 0 0 0  Suicidal thoughts 0 0 0  PHQ-9 Score 19 7 14   Difficult doing work/chores Very difficult Somewhat difficult -

## 2018-05-26 NOTE — Discharge Summary (Signed)
Obstetric Discharge Summary  Patient ID: Stephanie Logan MRN: [id number] DOB/AGE: 1991/04/09 27 y.o.   Date of Admission: 05/26/2018  Date of Discharge:  05/26/18  Admitting Diagnosis: Observation at [redacted]w[redacted]d  Secondary Diagnosis: Abnormal NIPT, Anxiety and Depression, History of IUFD   Discharge Diagnosis: No other diagnosis   Antepartum Procedures: NST, FFN, and sterile speculum exam   Brief Hospital Course   L&D OB Triage Note  Stephanie Logan is a 27 y.o. [id number] female at [redacted]w[redacted]d, EDD Estimated Date of Delivery: 09/07/18 who presented to triage for complaints of uterine contractions and fatigue.  She was evaluated by myself with significant findings for fetal distress or preterm labor. Vital signs stable. An NST was performed and has been reviewed by CNM. She was treated with Tylenol and Vistaril.    NST INTERPRETATION:  Indications: rule out uterine contractions  Mode: External Baseline Rate (A): 140 bpm Variability: Moderate Accelerations: 15 x 15 Decelerations: None Contraction Frequency (min): 2-3  Impression: reactive  Pelvic exam: VULVA: normal appearing vulva with no masses, tenderness or lesions, VAGINA: vaginal discharge - clear and white, CERVIX: normal appearing cervix without discharge or lesions, multiparous os, visually closed and thick.  Urinalysis, Routine w reflex microscopic     Status: Abnormal   Collection Time: 05/26/18  7:11 PM  Result Value Ref Range   Color, Urine YELLOW (A) YELLOW   APPearance CLEAR (A) CLEAR   Specific Gravity, Urine 1.005 1.005 - 1.030   pH 7.0 5.0 - 8.0   Glucose, UA NEGATIVE NEGATIVE mg/dL   Hgb urine dipstick NEGATIVE NEGATIVE   Bilirubin Urine NEGATIVE NEGATIVE   Ketones, ur NEGATIVE NEGATIVE mg/dL   Protein, ur NEGATIVE NEGATIVE mg/dL   Nitrite NEGATIVE NEGATIVE   Leukocytes, UA NEGATIVE NEGATIVE    Comment: Performed at [facility], [address] [place], [place] [address]  Fetal fibronectin      Status: None   Collection Time: 05/26/18  7:11 PM  Result Value Ref Range   Fetal Fibronectin NEGATIVE NEGATIVE   Appearance, FETFIB CLEAR CLEAR    Comment: Performed at [facility], [address] [place], [place] [address]     Plan: NST performed was reviewed and was found to be reactive. She was discharged home with bleeding/labor precautions.  Continue routine prenatal care. Follow up with OB/GYN as previously scheduled.    Discharge Instructions: Per After Visit Summary  Activity: Also refer to After Visit Summary  Diet: Regular  Medications: Allergies as of 05/26/2018      Reactions   Ciprofloxacin Rash      Medication List    STOP taking these medications   cefdinir 300 MG capsule Commonly known as:  OMNICEF   FUSION PLUS Caps   meclizine 12.5 MG tablet Commonly known as:  ANTIVERT     TAKE these medications   butalbital-acetaminophen-caffeine 50-325-40-30 MG capsule Commonly known as:  FIORICET WITH CODEINE Take 1 capsule by mouth every 4 (four) hours as needed for headache.   prenatal multivitamin Tabs tablet Take 1 tablet by mouth daily at 12 noon.   sertraline 100 MG tablet Commonly known as:  ZOLOFT Take 1 tablet (100 mg total) by mouth daily.   thiamine 100 MG tablet Commonly known as:  VITAMIN B-1 Take 100 mg by mouth daily.      Outpatient follow up:  Follow-up Information    [facility] Follow up.   Contact information: [place]  [place]  [place] [address] [phone]         Discharged Condition: stable  Discharged to: home   Diona Fanti, CNM [facility], [facility] 05/26/18 8:55 PM

## 2018-05-27 ENCOUNTER — Other Ambulatory Visit: Payer: Self-pay | Admitting: Certified Nurse Midwife

## 2018-05-27 ENCOUNTER — Encounter: Payer: Self-pay | Admitting: Certified Nurse Midwife

## 2018-05-27 LAB — CBC
HEMATOCRIT: 30.7 % — AB (ref 34.0–46.6)
Hemoglobin: 10.2 g/dL — ABNORMAL LOW (ref 11.1–15.9)
MCH: 27.3 pg (ref 26.6–33.0)
MCHC: 33.2 g/dL (ref 31.5–35.7)
MCV: 82 fL (ref 79–97)
Platelets: 241 10*3/uL (ref 150–450)
RBC: 3.73 x10E6/uL — ABNORMAL LOW (ref 3.77–5.28)
RDW: 14.4 % (ref 12.3–15.4)
WBC: 8.4 10*3/uL (ref 3.4–10.8)

## 2018-05-27 LAB — B12 AND FOLATE PANEL
Folate: 10.4 ng/mL (ref >3.0)
Vitamin B-12: 226 pg/mL — ABNORMAL LOW (ref 232–1245)

## 2018-05-27 MED ORDER — HYDROXYZINE HCL 25 MG PO TABS: 25.0000 mg | ORAL_TABLET | Freq: Four times a day (QID) | ORAL | 2 refills | Status: DC | PRN

## 2018-05-28 LAB — URINE CULTURE

## 2018-05-30 ENCOUNTER — Encounter: Payer: Self-pay | Admitting: Certified Nurse Midwife

## 2018-05-30 DIAGNOSIS — E538 Deficiency of other specified B group vitamins: Secondary | ICD-10-CM | POA: Insufficient documentation

## 2018-05-31 ENCOUNTER — Other Ambulatory Visit: Payer: Self-pay | Admitting: *Deleted

## 2018-05-31 ENCOUNTER — Telehealth: Payer: Self-pay | Admitting: *Deleted

## 2018-05-31 MED ORDER — PROMETHAZINE HCL 12.5 MG RE SUPP: 12.5000 mg | Freq: Four times a day (QID) | RECTAL | 0 refills | Status: DC | PRN

## 2018-05-31 MED ORDER — ONDANSETRON 4 MG PO TBDP: 4.0000 mg | ORAL_TABLET | Freq: Three times a day (TID) | ORAL | 0 refills | Status: DC | PRN

## 2018-05-31 NOTE — Telephone Encounter (Signed)
Pt called stating her son came home on 05/30/18 with vomiting, today seemed better, this am @ 2:00 am she started having diarrhea, hurting in her upper abdomen, rib cage, spoke with JML we sent in Phenergan 12.5mg  supp, Zofran, pt voiced understanding

## 2018-06-01 ENCOUNTER — Ambulatory Visit (INDEPENDENT_AMBULATORY_CARE_PROVIDER_SITE_OTHER): Admitting: Obstetrics and Gynecology

## 2018-06-01 ENCOUNTER — Telehealth: Payer: Self-pay | Admitting: Obstetrics and Gynecology

## 2018-06-01 VITALS — BP 111/65 | HR 119 | Wt 160.1 lb

## 2018-06-01 DIAGNOSIS — R1013 Epigastric pain: Secondary | ICD-10-CM

## 2018-06-01 DIAGNOSIS — Z331 Pregnant state, incidental: Secondary | ICD-10-CM | POA: Diagnosis not present

## 2018-06-01 DIAGNOSIS — Z3492 Encounter for supervision of normal pregnancy, unspecified, second trimester: Secondary | ICD-10-CM

## 2018-06-01 LAB — POCT URINALYSIS DIPSTICK OB
Bilirubin, UA: NEGATIVE
Blood, UA: NEGATIVE
GLUCOSE, UA: NEGATIVE
Ketones, UA: NEGATIVE
Leukocytes, UA: NEGATIVE
Nitrite, UA: NEGATIVE
POC,PROTEIN,UA: NEGATIVE
SPEC GRAV UA: 1.01 (ref 1.010–1.025)
Urobilinogen, UA: 0.2 E.U./dL
pH, UA: 6.5 (ref 5.0–8.0)

## 2018-06-01 NOTE — Progress Notes (Signed)
Work in [employer]- states intermittent epigastric pain that radiates around right side to back for 3 days. Does have nausea associated with it and feels SOB with activity and laying down. Otherwise no elieviating factors.denies heartburn, or vomiting or changes in BM. Discussed suspicion for gallbladder issues and increased frequency in pregnancy. Gallbladder ultrasound ordered. Will follow up accordingly. Lungs clear bilaterally and BSx4 normal + epigastric pain on palpation. B12 injection given today. Will repeat in 1 month.

## 2018-06-01 NOTE — Telephone Encounter (Signed)
The patient is asking if she can come in to have the provider/nurse listen to the fetal heart tones as she is concerned due to recent pain and her past history of MAB.  Please advise, thanks.

## 2018-06-01 NOTE — Telephone Encounter (Signed)
Called pt appt made 06/01/18 @ 1:30

## 2018-06-01 NOTE — Patient Instructions (Addendum)
Low-Fat Diet for Pancreatitis or Gallbladder Conditions A low-fat diet can be helpful if you have pancreatitis or a gallbladder condition. With these conditions, your pancreas and gallbladder have trouble digesting fats. A healthy eating plan with less fat will help rest your pancreas and gallbladder and reduce your symptoms. What do I need to know about this diet?  Eat a low-fat diet. ? Reduce your fat intake to less than 20-30% of your total daily calories. This is less than 50-60 g of fat per day. ? Remember that you need some fat in your diet. Ask your dietician what your daily goal should be. ? Choose nonfat and low-fat healthy foods. Look for the words "nonfat," "low fat," or "fat free." ? As a guide, look on the label and choose foods with less than 3 g of fat per serving. Eat only one serving.  Avoid alcohol.  Do not smoke. If you need help quitting, talk with your health care provider.  Eat small frequent meals instead of three large heavy meals. What foods can I eat? Grains Include healthy grains and starches such as potatoes, wheat bread, fiber-rich cereal, and brown rice. Choose whole grain options whenever possible. In adults, whole grains should account for 45-65% of your daily calories. Fruits and Vegetables Eat plenty of fruits and vegetables. Fresh fruits and vegetables add fiber to your diet. Meats and Other Protein Sources Eat lean meat such as chicken and pork. Trim any fat off of meat before cooking it. Eggs, fish, and beans are other sources of protein. In adults, these foods should account for 10-35% of your daily calories. Dairy Choose low-fat milk and dairy options. Dairy includes fat and protein, as well as calcium. Fats and Oils Limit high-fat foods such as fried foods, sweets, baked goods, sugary drinks. Other Creamy sauces and condiments, such as mayonnaise, can add extra fat. Think about whether or not you need to use them, or use smaller amounts or low fat  options. What foods are not recommended?  High fat foods, such as: ? [employer]. ? Ice cream. ? [place] toast. ? Sweet rolls. ? Pizza. ? Cheese bread. ? Foods covered with batter, butter, creamy sauces, or cheese. ? Fried foods. ? Sugary drinks and desserts.  Foods that cause gas or bloating This information is not intended to replace advice given to you by your health care provider. Make sure you discuss any questions you have with your health care provider. Document Released: 09/12/2013 Document Revised: 02/13/2016 Document Reviewed: 08/21/2013 Elsevier Interactive Patient Education  2017 Elsevier Inc.  Vitamin B12 Deficiency Vitamin B12 deficiency occurs when the body does not have enough vitamin B12. Vitamin B12 is an important vitamin. The body needs vitamin B12:  To make red blood cells.  To make DNA. This is the genetic material inside cells.  To help the nerves work properly so they can carry messages from the brain to the body.  Vitamin B12 deficiency can cause various health problems, such as a low red blood cell count (anemia) or nerve damage. What are the causes? This condition may be caused by:  Not eating enough foods that contain vitamin B12.  Not having enough stomach acid and digestive fluids to properly absorb vitamin B12 from the food that you eat.  Certain digestive system diseases that make it hard to absorb vitamin B12. These diseases include Crohn disease, chronic pancreatitis, and cystic fibrosis.  Pernicious anemia. This is a condition in which the body does not make enough of  a protein (intrinsic factor), resulting in too few red blood cells.  Having a surgery in which part of the stomach or small intestine is removed.  Taking certain medicines that make it hard for the body to absorb vitamin B12. These medicines include: ? Heartburn medicine (antacids and proton pump inhibitors). ? An antibiotic medicine called neomycin. ? Some medicines that  are used to treat diabetes, tuberculosis, gout, or high cholesterol.  What increases the risk? The following factors may make you more likely to develop a B12 deficiency:  Being older than age 60.  Eating a vegetarian or vegan diet, especially while you are pregnant.  Eating a poor diet while you are pregnant.  Taking certain drugs.  Having alcoholism.  What are the signs or symptoms? In some cases, there are no symptoms of this condition. If the condition leads to anemia or nerve damage, various symptoms can occur, such as:  Weakness.  Fatigue.  Loss of appetite.  Weight loss.  Numbness or tingling in your hands and feet.  Redness and burning of the tongue.  Confusion or memory problems.  Depression.  Sensory problems, such as color blindness, ringing in the ears, or loss of taste.  Diarrhea or constipation.  Trouble walking.  If anemia is severe, symptoms can include:  Shortness of breath.  Dizziness.  Rapid heart rate (tachycardia).  How is this diagnosed? This condition may be diagnosed with a blood test to measure the level of vitamin B12 in your blood. You may have other tests to help find the cause of your vitamin B12 deficiency. These tests may include:  A complete blood count (CBC). This is a group of tests that measure certain characteristics of blood cells.  A blood test to measure intrinsic factor.  An endoscopy. In this procedure, a thin tube with a camera on the end is used to look into your stomach or intestines.  How is this treated? Treatment for this condition depends on the cause. Common treatment options include:  Changing your eating and drinking habits, such as: ? Eating more foods that contain vitamin B12. ? Drinking less alcohol or no alcohol.  Taking vitamin B12 supplements. Your health care provider will tell you which dosage is best for you.  Getting vitamin B12 injections.  Follow these instructions at home:  Take  supplements only as told by your health care provider. Follow the directions carefully.  Get any injections that are prescribed by your health care provider.  Do not miss your appointments.  Eat lots of healthy foods that contain vitamin B12. Ask your health care provider if you should work with a dietitian. Foods that contain vitamin B12 include: ? Meat. ? Meat from birds (poultry). ? Fish. ? Eggs. ? Cereal and dairy products that are fortified. This means that vitamin B12 has been added to the food. Check the label on the package to see if the food is fortified.  Do not abuse alcohol.  Keep all follow-up visits as told by your health care provider. This is important. Contact a health care provider if:  Your symptoms come back. Get help right away if:  You develop shortness of breath.  You have chest pain.  You become dizzy or you lose consciousness. This information is not intended to replace advice given to you by your health care provider. Make sure you discuss any questions you have with your health care provider. Document Released: 11/30/2011 Document Revised: 02/19/2016 Document Reviewed: 01/23/2015 Elsevier Interactive Patient Education  2018  [employer].

## 2018-06-01 NOTE — Progress Notes (Signed)
OB WORK IN- pain in upper abdomen started x 3 days ago, the pain is radiating around to her R rib cage, she is very nauseous and in painful

## 2018-06-02 ENCOUNTER — Ambulatory Visit: Payer: Self-pay

## 2018-06-02 ENCOUNTER — Ambulatory Visit
Admission: RE | Admit: 2018-06-02 | Discharge: 2018-06-02 | Disposition: A | Discharge status: Home / Self Care | Source: Ambulatory Visit | Attending: Obstetrics and Gynecology | Admitting: Obstetrics and Gynecology

## 2018-06-02 ENCOUNTER — Other Ambulatory Visit: Payer: Self-pay | Admitting: Obstetrics and Gynecology

## 2018-06-02 DIAGNOSIS — Z3A00 Weeks of gestation of pregnancy not specified: Secondary | ICD-10-CM | POA: Diagnosis not present

## 2018-06-02 DIAGNOSIS — N200 Calculus of kidney: Secondary | ICD-10-CM | POA: Insufficient documentation

## 2018-06-02 DIAGNOSIS — D1803 Hemangioma of intra-abdominal structures: Secondary | ICD-10-CM | POA: Diagnosis not present

## 2018-06-02 DIAGNOSIS — O26892 Other specified pregnancy related conditions, second trimester: Secondary | ICD-10-CM | POA: Diagnosis not present

## 2018-06-02 DIAGNOSIS — R1013 Epigastric pain: Secondary | ICD-10-CM | POA: Diagnosis present

## 2018-06-02 DIAGNOSIS — Z3492 Encounter for supervision of normal pregnancy, unspecified, second trimester: Secondary | ICD-10-CM

## 2018-06-03 ENCOUNTER — Other Ambulatory Visit: Payer: Self-pay | Admitting: Certified Nurse Midwife

## 2018-06-03 DIAGNOSIS — Z8759 Personal history of other complications of pregnancy, childbirth and the puerperium: Secondary | ICD-10-CM

## 2018-06-08 ENCOUNTER — Other Ambulatory Visit: Payer: Self-pay | Admitting: Certified Nurse Midwife

## 2018-06-08 DIAGNOSIS — F329 Major depressive disorder, single episode, unspecified: Secondary | ICD-10-CM

## 2018-06-08 DIAGNOSIS — O99340 Other mental disorders complicating pregnancy, unspecified trimester: Secondary | ICD-10-CM

## 2018-06-09 ENCOUNTER — Telehealth: Payer: Self-pay | Admitting: Obstetrics and Gynecology

## 2018-06-09 NOTE — Telephone Encounter (Signed)
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  She did not return my call, but per the [facility] system, she is scheduled to see Dr. Jaynee Eagles at the [facility] on 06/29/2018.    Wilburt Finlay, MS, CGC

## 2018-06-13 ENCOUNTER — Ambulatory Visit (INDEPENDENT_AMBULATORY_CARE_PROVIDER_SITE_OTHER): Admitting: Urology

## 2018-06-13 ENCOUNTER — Encounter: Payer: Self-pay | Admitting: Urology

## 2018-06-13 VITALS — BP 105/67 | HR 97 | Ht 63.0 in | Wt 162.6 lb

## 2018-06-13 DIAGNOSIS — N200 Calculus of kidney: Secondary | ICD-10-CM

## 2018-06-13 LAB — URINALYSIS, COMPLETE
BILIRUBIN UA: NEGATIVE
GLUCOSE, UA: NEGATIVE
Ketones, UA: NEGATIVE
Nitrite, UA: NEGATIVE
PH UA: 7 (ref 5.0–7.5)
PROTEIN UA: NEGATIVE
RBC UA: NEGATIVE
SPEC GRAV UA: 1.015 (ref 1.005–1.030)
Urobilinogen, Ur: 0.2 mg/dL (ref 0.2–1.0)

## 2018-06-13 LAB — MICROSCOPIC EXAMINATION: RBC, UA: NONE SEEN /hpf (ref 0–2)

## 2018-06-13 NOTE — Progress Notes (Signed)
06/13/2018 2:57 PM   Stephanie Logan April 02, 1991 [id number]  Referring provider: Katheren Shams [address] [place],  [address]  Chief Complaint  Patient presents with  . Nephrolithiasis    HPI: 27 year old female with IUP and estimated delivery date 09/07/2018.  She was recently seen in OB for intermittent epigastric pain radiating to the right upper quadrant.  She had an abdominal ultrasound performed on 06/02/2018 which did not show cholelithiasis.  She was incidentally noted to have an 8 mm nonobstructing right upper pole renal calculus.  She is presently asymptomatic.  She states she was having pain at the time of the ultrasound.  She has no bothersome lower urinary tract symptoms.  This is her fourth pregnancy and she denies stones with previous pregnancies however she did pass a stone shortly after delivery of her first child.   PMH: Past Medical History:  Diagnosis Date  . Amenorrhea   . Anxiety   . PCOS (polycystic ovarian syndrome)   . Post partum depression     Surgical History: Past Surgical History:  Procedure Laterality Date  . BREAST FIBROADENOMA SURGERY  2008,2010    Home Medications:  Allergies as of 06/13/2018      Reactions   Ciprofloxacin Rash      Medication List        Accurate as of 06/13/18  2:57 PM. Always use your most recent med list.          butalbital-acetaminophen-caffeine 50-325-40-30 MG capsule Commonly known as:  FIORICET WITH CODEINE Take 1 capsule by mouth every 4 (four) hours as needed for headache.   hydrOXYzine 25 MG tablet Commonly known as:  ATARAX/VISTARIL Take 1 tablet (25 mg total) by mouth every 6 (six) hours as needed (contractions).   ondansetron 4 MG disintegrating tablet Commonly known as:  ZOFRAN-ODT Take 1 tablet (4 mg total) by mouth every 8 (eight) hours as needed for nausea or vomiting.   prenatal multivitamin Tabs tablet Take 1 tablet by mouth daily at 12 noon.   promethazine  12.5 MG suppository Commonly known as:  PHENERGAN Place 1 suppository (12.5 mg total) rectally every 6 (six) hours as needed for nausea or vomiting.   sertraline 100 MG tablet Commonly known as:  ZOLOFT Take 1 tablet (100 mg total) by mouth daily.   thiamine 100 MG tablet Commonly known as:  VITAMIN B-1 Take 100 mg by mouth daily.       Allergies:  Allergies  Allergen Reactions  . Ciprofloxacin Rash    Family History: Family History  Problem Relation Age of Onset  . Cancer Maternal Grandmother        brain  . Cancer Mother        cervical, precancerous  . Seizures Sister     Social History:  reports that she has never smoked. She has never used smokeless tobacco. She reports that she drank alcohol. She reports that she does not use drugs.  ROS: UROLOGY Frequent Urination?: Yes Hard to postpone urination?: No Burning/pain with urination?: No Get up at night to urinate?: No Leakage of urine?: No Urine stream starts and stops?: No Trouble starting stream?: No Do you have to strain to urinate?: No Blood in urine?: No Urinary tract infection?: No Sexually transmitted disease?: No Injury to kidneys or bladder?: No Painful intercourse?: No Weak stream?: No Currently pregnant?: Yes Vaginal bleeding?: No Last menstrual period?: Pregnant  Gastrointestinal Nausea?: Yes Vomiting?: No Indigestion/heartburn?: No Diarrhea?: No Constipation?: No  Constitutional Fever: No  Night sweats?: Yes Weight loss?: No Fatigue?: No  Skin Skin rash/lesions?: No Itching?: No  Eyes Blurred vision?: No Double vision?: No  Ears/Nose/Throat Sore throat?: No Sinus problems?: No  Hematologic/Lymphatic Swollen glands?: No Easy bruising?: No  Cardiovascular Leg swelling?: No Chest pain?: No  Respiratory Cough?: No Shortness of breath?: No  Endocrine Excessive thirst?: Yes  Musculoskeletal Back pain?: Yes Joint pain?: No  Neurological Headaches?:  Yes Dizziness?: No  Psychologic Depression?: Yes Anxiety?: Yes  Physical Exam: BP 105/67 (BP Location: Left Arm, Patient Position: Sitting, Cuff Size: Normal)   Pulse 97   Ht 5\' 3"  (1.6 m)   Wt 162 lb 9.6 oz (73.8 kg)   LMP 08/20/2017   BMI 28.80 kg/m   Constitutional:  Alert and oriented, No acute distress. HEENT: [place] AT, moist mucus membranes.  Trachea midline, no masses. Cardiovascular: No clubbing, cyanosis, or edema. Respiratory: Normal respiratory effort, no increased work of breathing. GI: Abdomen is soft, nontender, nondistended, gravid GU: No CVA tenderness Lymph: No cervical or inguinal lymphadenopathy. Skin: No rashes, bruises or suspicious lesions. Neurologic: Grossly intact, no focal deficits, moving all 4 extremities. Psychiatric: Normal mood and affect.  Laboratory Data:  Urinalysis Dipstick-Trace leukocytes Microscopy-negative  Assessment & Plan:   27 year old female with IUP and a nonobstructing 8 mm right renal calculus.  She is presently asymptomatic and does not have hydronephrosis.  Would recommend observation.  Follow-up as needed for symptoms of renal colic or UTI.  Will otherwise plan a KUB postdelivery.  She was informed it is unlikely an 8 mm calculus would pass if it migrated to the ureter.  Treatment options of shockwave lithotripsy and ureteroscopy were both discussed.  We will discuss further on follow-up.   No follow-ups on file.  Abbie Sons, [place] [address] [place], [place] [address] [phone]

## 2018-06-17 ENCOUNTER — Ambulatory Visit

## 2018-06-17 ENCOUNTER — Encounter: Payer: Self-pay | Admitting: Certified Nurse Midwife

## 2018-06-17 ENCOUNTER — Ambulatory Visit (INDEPENDENT_AMBULATORY_CARE_PROVIDER_SITE_OTHER): Admitting: Certified Nurse Midwife

## 2018-06-17 ENCOUNTER — Ambulatory Visit (INDEPENDENT_AMBULATORY_CARE_PROVIDER_SITE_OTHER)

## 2018-06-17 VITALS — BP 100/60 | HR 98 | Wt 161.3 lb

## 2018-06-17 DIAGNOSIS — Z3492 Encounter for supervision of normal pregnancy, unspecified, second trimester: Secondary | ICD-10-CM

## 2018-06-17 DIAGNOSIS — Z3A28 28 weeks gestation of pregnancy: Secondary | ICD-10-CM | POA: Diagnosis not present

## 2018-06-17 DIAGNOSIS — O09293 Supervision of pregnancy with other poor reproductive or obstetric history, third trimester: Secondary | ICD-10-CM | POA: Diagnosis not present

## 2018-06-17 DIAGNOSIS — Z23 Encounter for immunization: Secondary | ICD-10-CM | POA: Diagnosis not present

## 2018-06-17 DIAGNOSIS — Z8759 Personal history of other complications of pregnancy, childbirth and the puerperium: Secondary | ICD-10-CM

## 2018-06-17 LAB — POCT URINALYSIS DIPSTICK OB
Bilirubin, UA: NEGATIVE
Blood, UA: NEGATIVE
Glucose, UA: NEGATIVE
Ketones, UA: NEGATIVE
Leukocytes, UA: NEGATIVE
NITRITE UA: NEGATIVE
Spec Grav, UA: 1.025 (ref 1.010–1.025)
Urobilinogen, UA: 0.2 E.U./dL
pH, UA: 6 (ref 5.0–8.0)

## 2018-06-17 MED ORDER — TETANUS-DIPHTH-ACELL PERTUSSIS 5-2.5-18.5 LF-MCG/0.5 IM SUSP
0.5000 mL | Freq: Once | INTRAMUSCULAR | Status: AC
  Administered 2018-06-17: 0.5 mL via INTRAMUSCULAR

## 2018-06-17 NOTE — Progress Notes (Signed)
ROB doing well. No complaints. Feels good movement. U/s today for growth Per MFM recommendations. Weekly antenatal testing to start at 32 wks. Pt verbalizes and agrees to plan. BTC/Tdap/CBC/RPR/ glucose screen today. Discussed birth control after baby, classes , and birth plan today. Follow up in 2 wks.   Philip Aspen, CNM

## 2018-06-17 NOTE — Patient Instructions (Signed)

## 2018-06-18 LAB — CBC
Hematocrit: 30.1 % — ABNORMAL LOW (ref 34.0–46.6)
Hemoglobin: 9.6 g/dL — ABNORMAL LOW (ref 11.1–15.9)
MCH: 25.8 pg — ABNORMAL LOW (ref 26.6–33.0)
MCHC: 31.9 g/dL (ref 31.5–35.7)
MCV: 81 fL (ref 79–97)
Platelets: 287 x10E3/uL (ref 150–450)
RBC: 3.72 x10E6/uL — ABNORMAL LOW (ref 3.77–5.28)
RDW: 13.5 % (ref 12.3–15.4)
WBC: 9.7 x10E3/uL (ref 3.4–10.8)

## 2018-06-18 LAB — GLUCOSE, 1 HOUR GESTATIONAL: GESTATIONAL DIABETES SCREEN: 107 mg/dL (ref 65–139)

## 2018-06-18 LAB — SYPHILIS: RPR W/REFLEX TO RPR TITER AND TREPONEMAL ANTIBODIES, TRADITIONAL SCREENING AND DIAGNOSIS ALGORITHM: RPR Ser Ql: NONREACTIVE

## 2018-06-21 ENCOUNTER — Encounter: Payer: Self-pay | Admitting: Obstetrics and Gynecology

## 2018-06-24 ENCOUNTER — Observation Stay
Admission: EM | Admit: 2018-06-24 | Discharge: 2018-06-25 | Disposition: A | Discharge status: Home / Self Care | Attending: Certified Nurse Midwife | Admitting: Certified Nurse Midwife

## 2018-06-24 ENCOUNTER — Other Ambulatory Visit: Payer: Self-pay

## 2018-06-24 DIAGNOSIS — Z3A29 29 weeks gestation of pregnancy: Secondary | ICD-10-CM | POA: Insufficient documentation

## 2018-06-24 DIAGNOSIS — F419 Anxiety disorder, unspecified: Secondary | ICD-10-CM | POA: Insufficient documentation

## 2018-06-24 DIAGNOSIS — O99343 Other mental disorders complicating pregnancy, third trimester: Secondary | ICD-10-CM | POA: Insufficient documentation

## 2018-06-24 DIAGNOSIS — Z79899 Other long term (current) drug therapy: Secondary | ICD-10-CM | POA: Insufficient documentation

## 2018-06-24 DIAGNOSIS — O26833 Pregnancy related renal disease, third trimester: Principal | ICD-10-CM | POA: Insufficient documentation

## 2018-06-24 DIAGNOSIS — N200 Calculus of kidney: Secondary | ICD-10-CM

## 2018-06-24 DIAGNOSIS — Z881 Allergy status to other antibiotic agents status: Secondary | ICD-10-CM | POA: Insufficient documentation

## 2018-06-24 DIAGNOSIS — E282 Polycystic ovarian syndrome: Secondary | ICD-10-CM | POA: Insufficient documentation

## 2018-06-24 DIAGNOSIS — F329 Major depressive disorder, single episode, unspecified: Secondary | ICD-10-CM | POA: Insufficient documentation

## 2018-06-24 DIAGNOSIS — O99283 Endocrine, nutritional and metabolic diseases complicating pregnancy, third trimester: Secondary | ICD-10-CM | POA: Insufficient documentation

## 2018-06-24 NOTE — OB Triage Note (Signed)
Pt complains of upper right quadrant pain 3/10. Same place that has been bothering where they found a kidney stone. Also complains of unbearable itching on hands, feet, and scalp mainly with body. Nausea that started today, denies vomiting or diarrhea. No LOF, or bleeding.

## 2018-06-25 DIAGNOSIS — N200 Calculus of kidney: Secondary | ICD-10-CM

## 2018-06-25 DIAGNOSIS — Z881 Allergy status to other antibiotic agents status: Secondary | ICD-10-CM | POA: Diagnosis not present

## 2018-06-25 DIAGNOSIS — O26833 Pregnancy related renal disease, third trimester: Secondary | ICD-10-CM | POA: Diagnosis not present

## 2018-06-25 DIAGNOSIS — O09293 Supervision of pregnancy with other poor reproductive or obstetric history, third trimester: Secondary | ICD-10-CM

## 2018-06-25 DIAGNOSIS — F419 Anxiety disorder, unspecified: Secondary | ICD-10-CM | POA: Diagnosis not present

## 2018-06-25 DIAGNOSIS — Z3A29 29 weeks gestation of pregnancy: Secondary | ICD-10-CM

## 2018-06-25 DIAGNOSIS — Z79899 Other long term (current) drug therapy: Secondary | ICD-10-CM | POA: Diagnosis not present

## 2018-06-25 DIAGNOSIS — O9989 Other specified diseases and conditions complicating pregnancy, childbirth and the puerperium: Secondary | ICD-10-CM

## 2018-06-25 DIAGNOSIS — F418 Other specified anxiety disorders: Secondary | ICD-10-CM

## 2018-06-25 DIAGNOSIS — O99283 Endocrine, nutritional and metabolic diseases complicating pregnancy, third trimester: Secondary | ICD-10-CM | POA: Diagnosis not present

## 2018-06-25 DIAGNOSIS — R1011 Right upper quadrant pain: Secondary | ICD-10-CM

## 2018-06-25 DIAGNOSIS — O99343 Other mental disorders complicating pregnancy, third trimester: Secondary | ICD-10-CM

## 2018-06-25 DIAGNOSIS — E282 Polycystic ovarian syndrome: Secondary | ICD-10-CM | POA: Diagnosis not present

## 2018-06-25 DIAGNOSIS — F329 Major depressive disorder, single episode, unspecified: Secondary | ICD-10-CM | POA: Diagnosis not present

## 2018-06-25 LAB — URINALYSIS, COMPLETE (UACMP) WITH MICROSCOPIC
Bacteria, UA: NONE SEEN
Bilirubin Urine: NEGATIVE
Glucose, UA: NEGATIVE mg/dL
Ketones, ur: NEGATIVE mg/dL
Leukocytes, UA: NEGATIVE
Nitrite: NEGATIVE
Protein, ur: NEGATIVE mg/dL
SPECIFIC GRAVITY, URINE: 1.004 — AB (ref 1.005–1.030)
pH: 7 (ref 5.0–8.0)

## 2018-06-25 LAB — COMPREHENSIVE METABOLIC PANEL
ALBUMIN: 3.1 g/dL — AB (ref 3.5–5.0)
ALT: 12 U/L (ref 0–44)
AST: 19 U/L (ref 15–41)
Alkaline Phosphatase: 84 U/L (ref 38–126)
Anion gap: 6 (ref 5–15)
BILIRUBIN TOTAL: 0.5 mg/dL (ref 0.3–1.2)
BUN: 8 mg/dL (ref 6–20)
CO2: 22 mmol/L (ref 22–32)
CREATININE: 0.45 mg/dL (ref 0.44–1.00)
Calcium: 8.7 mg/dL — ABNORMAL LOW (ref 8.9–10.3)
Chloride: 107 mmol/L (ref 98–111)
GFR calc Af Amer: >60 mL/min (ref >60)
GLUCOSE: 108 mg/dL — AB (ref 70–99)
Potassium: 3.6 mmol/L (ref 3.5–5.1)
Sodium: 135 mmol/L (ref 135–145)
TOTAL PROTEIN: 6.4 g/dL — AB (ref 6.5–8.1)

## 2018-06-25 MED ORDER — HYDROXYZINE HCL 25 MG PO TABS
25.0000 mg | ORAL_TABLET | Freq: Three times a day (TID) | ORAL | Status: DC | PRN
  Administered 2018-06-25: 25 mg via ORAL
  Filled 2018-06-25: qty 1

## 2018-06-25 MED ORDER — ACETAMINOPHEN 500 MG PO TABS
1000.0000 mg | ORAL_TABLET | Freq: Four times a day (QID) | ORAL | Status: DC | PRN
  Administered 2018-06-25: 1000 mg via ORAL
  Filled 2018-06-25: qty 2

## 2018-06-25 MED ORDER — ACETAMINOPHEN 500 MG PO TABS: 1000.0000 mg | ORAL_TABLET | Freq: Four times a day (QID) | ORAL | 0 refills | Status: DC | PRN

## 2018-06-25 NOTE — Discharge Summary (Signed)
Obstetric Discharge Summary  Patient ID: Stephanie Logan MRN: [id number] DOB/AGE: 29-05-1991 29 y.o.   Date of Admission: 06/24/2018  Date of Discharge: 06/25/2018 06/25/18  Admitting Diagnosis: Observation at [redacted]w[redacted]d  Secondary Diagnosis: Kidney stone complicating pregnancy, PCOS, Depression/Anxiety, History of IUFD at 83 weeks, B-12 Deficiency, Abnormal NIPT-uninformative DNA pattern    Discharge Diagnosis: No other diagnosis   Antepartum Procedures: NST, Oral Medications (Tylenol/Vistaril) and Labs (CMP/Bile Acids)    Brief Hospital Course   L&D OB Triage Note  ALAN RILES is a 27 y.o. [id number] female at [redacted]w[redacted]d, EDD Estimated Date of Delivery: 09/07/18 who presented to triage for complaints of intermittent RUQ pain (3/10) for majority of the day. Also, she reports "unbearable" itching to her hands, feet, and scalp. No home treatment measures utilized.   Labs were collected. She was treated with Tylenol and Vistaril and endorses relief of symptoms, pain 1/10. She was evaluated by the nurses with no significant findings for preterm labor, fetal distress, or cholestasis. Vital signs stable. An NST was performed and has been reviewed by CNM.   Labs:  CMP Latest Ref Rng & Units 06/25/2018 06/17/2017 08/10/2016  Glucose 70 - 99 mg/dL 108(H) 82 101(H)  BUN 6 - 20 mg/dL 8 14 8   Creatinine 0.44 - 1.00 mg/dL 0.45 0.81 0.54  Sodium 135 - 145 mmol/L 135 138 134(L)  Potassium 3.5 - 5.1 mmol/L 3.6 4.4 3.3(L)  Chloride 98 - 111 mmol/L 107 98 105  CO2 22 - 32 mmol/L 22 26 23   Calcium 8.9 - 10.3 mg/dL 8.7(L) 9.7 9.0  Total Protein 6.5 - 8.1 g/dL 6.4(L) 7.9 7.4  Total Bilirubin 0.3 - 1.2 mg/dL 0.5 0.4 0.8  Alkaline Phos 38 - 126 U/L 84 62 61  AST 15 - 41 U/L 19 18 24   ALT 0 - 44 U/L 12 12 16    Urinalysis    Component Value Date/Time   COLORURINE COLORLESS (A) 06/25/2018 0029   APPEARANCEUR CLEAR (A) 06/25/2018 0029   APPEARANCEUR Clear 06/13/2018 1117   LABSPEC 1.004 (L) 06/25/2018  0029   PHURINE 7.0 06/25/2018 0029   GLUCOSEU NEGATIVE 06/25/2018 0029   HGBUR MODERATE (A) 06/25/2018 0029   BILIRUBINUR NEGATIVE 06/25/2018 0029   BILIRUBINUR neg 06/17/2018 0858   BILIRUBINUR Negative 06/13/2018 1117   KETONESUR NEGATIVE 06/25/2018 0029   PROTEINUR NEGATIVE 06/25/2018 0029   UROBILINOGEN 0.2 06/17/2018 0858   NITRITE NEGATIVE 06/25/2018 0029   LEUKOCYTESUR NEGATIVE 06/25/2018 0029   LEUKOCYTESUR Trace (A) 06/13/2018 1117   Bile Acids: pending. Test is conducted off site and results will be available in next two (2) to three (3) days.   NST INTERPRETATION:  Indications: rule out uterine contractions  Mode: External Baseline Rate (A): 130 bpm Variability: Moderate Accelerations: 15 x 15 Decelerations: None Contraction Frequency (min): none  Impression: reactive   Plan: NST performed was reviewed and was found to be reactive. She was discharged home with bleeding/labor precautions.  Continue routine prenatal care. Follow up with CNM as previously scheduled. Follow up with Urology as indicated.    Discharge Instructions: Per After Visit Summary.  Activity:Also refer to After Visit Summary.  Diet: Regular  Medications: Allergies as of 06/25/2018      Reactions   Ciprofloxacin Rash      Medication List    STOP taking these medications   butalbital-acetaminophen-caffeine 50-325-40-30 MG capsule Commonly known as:  FIORICET WITH CODEINE     TAKE these medications   acetaminophen 500 MG tablet Commonly known  as:  TYLENOL Take 2 tablets (1,000 mg total) by mouth every 6 (six) hours as needed for fever or headache.   hydrOXYzine 25 MG tablet Commonly known as:  ATARAX/VISTARIL Take 1 tablet (25 mg total) by mouth every 6 (six) hours as needed (contractions).   ondansetron 4 MG disintegrating tablet Commonly known as:  ZOFRAN-ODT Take 1 tablet (4 mg total) by mouth every 8 (eight) hours as needed for nausea or vomiting.   prenatal multivitamin  Tabs tablet Take 1 tablet by mouth daily at 12 noon.   promethazine 12.5 MG suppository Commonly known as:  PHENERGAN Place 1 suppository (12.5 mg total) rectally every 6 (six) hours as needed for nausea or vomiting.   sertraline 100 MG tablet Commonly known as:  ZOLOFT Take 1 tablet (100 mg total) by mouth daily.   thiamine 100 MG tablet Commonly known as:  VITAMIN B-1 Take 100 mg by mouth daily.      Outpatient follow up:  Follow-up Information    [place], Melody N, CNM Follow up.   Specialties:  Obstetrics and Gynecology, Radiology Why:  As previously scheduled Contact information: [place] [address] [phone]           Discharged Condition: stable  Discharged to: home   Diona Fanti, CNM [facility], [facility] 06/25/18 2:21 AM

## 2018-06-25 NOTE — Discharge Instructions (Signed)
Skin Conditions During Pregnancy Pregnancy affects many parts of your body. One part is your skin. Most skin problems that develop during pregnancy are not serious and are considered a normal part of pregnancy. They go away on their own after the baby is born. Other skin problems may need treatment. What type of skin problems can develop during pregnancy?  Stretch marks. Stretch marks are purple or pink lines on the skin. They may appear on the belly, breasts, thighs, or buttocks. Stretch marks are caused by weight gain that causes the skin to stretch. Stretch marks do not cause problems. Almost all women get them during pregnancy.  Darkening of the skin (hyperpigmentation). The darkening may occur in patches or as a line. Patches may appear on the face, nipples, or genital area. Lines often stretch from the belly button to the pubic area. Hyperpigmentation develops in almost all pregnant women. It is more severe in women with a dark complexion.  Spider angiomas. These are tiny pink or red lines that go out from a center point, like the legs of a spider. Usually, they are on the face, neck, and arms. They do not cause problems. They are most common in women with light complexions.  Palmar erythema. This is a reddening of the palms. It is most common in women with light complexions.  Swelling and redness. This can occur on the face, eyelids, fingers, or toes.  Pruritic urticarial papules and plaques of pregnancy (PUPPP). This is a rash that is itchy, red, and has tiny blisters. The cause is unknown. It usually starts on the abdomen and may affect the arms or legs. It does not affect the face. It usually begins later in pregnancy. About a third of all pregnant women develop this condition. There are no associated problems to the fetus with this rash. Sometimes, oral steroids are used to calm down the itch. The rash clears after the baby is born.  Prurigo of pregnancy. This is a disease in which red  patches and bumps appear on the arms and legs. The cause is unknown. The patches and bumps clear after the baby is born. About a third of pregnant women develop this disease.  Acne. Pimples may develop, including in women who have had clear skin for a long time.  Skin tags. These are small flaps of skin that stick out from the body. They may grow or become darker during pregnancy. They are usually harmless.  Moles. These are flat or slightly raised growths. They are usually round and pink or brown. They may grow or become darker during pregnancy.  Intrahepatic cholestasis of pregnancy. This is a rare condition that causes itchy skin. It may run in families. It increases the risk of complications for the fetus. This condition usually resolves after delivery. It can recur with subsequent pregnancies.  Impetigo herpetiformis. This is a form of a severe skin disease called pustular psoriasis. Usually, delivery is the only method of resolving the condition.  Pruritic folliculitis of pregnancy. This is a rare condition that causes pimple-like skin growths. It develops in the middle or later stages of pregnancy. Its cause is unknown.It usually resolves 2-3 weeks after delivery.  Pemphigoid gestationis. This is a very rare autoimmune disease. It causes a severely itchy rash and blisters. The rash does not appear on the face, scalp, or inside of the mouth. It usually resolves 3 months after delivery. It may recur with subsequent pregnancies. Some pre-existing skin conditions, such as atopic dermatitis, may become worse  during pregnancy. Follow these instructions at home: Different conditions may have different instructions. In general:  Follow all your health care provider's directions about medicines to treat skin problems while you are pregnant. Do not use any over-the-counter medicines (including medicated creams and lotions) until you have checked with your health care provider. Many medicines are not  safe to use when you are pregnant.  Avoid time in the sun. This will help keep your skin from darkening. When you must be outside, use sunscreen and wear a hat with a wide brim to protect your face. The sunscreen should have a SPF of at least 50. This may help limit dark spots that develop when the skin is exposed to the sun.  To avoid problems from stretched skin: ? Do not sit or stand for long periods of time. ? Exercise regularly. This helps keep your skin in good condition.  Use a gentle soap. This helps prevent acne.  Do not get too hot or too sweaty. This makes some skin rashes worse.  Wear loose clothes made of a soft fabric. This prevents skin irritation.  For itching, add oatmeal or cornstarch to your bathwater.  Use a skin moisturizer. Ask your health care provider for suggestions.  This information is not intended to replace advice given to you by your health care provider. Make sure you discuss any questions you have with your health care provider. Document Released: 10/10/2010 Document Revised: 02/13/2016 Document Reviewed: 06/19/2013 Elsevier Interactive Patient Education  2018 [employer].  Renal Colic Renal colic is pain that is caused by a kidney stone. Follow these instructions at home:  Take medicines only as told by your doctor.  Ask your doctor if it is okay to take over-the-counter medicine for pain.  Drink enough fluid to keep your pee (urine) clear or pale yellow. Drink 6-8 glasses of water each day.  Eat less than 2 grams of salt per day.  Eat less protein. Some foods that have protein are meats, fish, nuts, and dairy.  Try not to eat spinach, rhubarb, nuts, or bran. Contact a doctor if:  You have a fever or chills.  Your pee smells bad or looks cloudy.  You have pain or burning when you pee. Get help right away if:  The pain in your side (flank) or your groin suddenly gets worse.  You get confused.  You pass out. This information is not  intended to replace advice given to you by your health care provider. Make sure you discuss any questions you have with your health care provider. Document Released: 02/24/2008 Document Revised: 02/13/2016 Document Reviewed: 07/18/2014 Elsevier Interactive Patient Education  2018 [employer].  Hydroxyzine capsules or tablets What is this medicine? HYDROXYZINE (hye [place] i zeen) is an antihistamine. This medicine is used to treat allergy symptoms. It is also used to treat anxiety and tension. This medicine can be used with other medicines to induce sleep before surgery. This medicine may be used for other purposes; ask your health care provider or pharmacist if you have questions. COMMON BRAND NAME(S): ANX, Atarax, Rezine, Vistaril What should I tell my health care provider before I take this medicine? They need to know if you have any of these conditions: -any chronic illness -difficulty passing urine -glaucoma -heart disease -kidney disease -liver disease -lung disease -an unusual or allergic reaction to hydroxyzine, cetirizine, other medicines, foods, dyes, or preservatives -pregnant or trying to get pregnant -breast-feeding How should I use this medicine? Take this medicine by  mouth with a full glass of water. Follow the directions on the prescription label. You may take this medicine with food or on an empty stomach. Take your medicine at regular intervals. Do not take your medicine more often than directed. Talk to your pediatrician regarding the use of this medicine in children. Special care may be needed. While this drug may be prescribed for children as young as 60 years of age for selected conditions, precautions do apply. Patients over 1 years old may have a stronger reaction and need a smaller dose. Overdosage: If you think you have taken too much of this medicine contact a poison control center or emergency room at once. NOTE: This medicine is only for you. Do not share this  medicine with others. What if I miss a dose? If you miss a dose, take it as soon as you can. If it is almost time for your next dose, take only that dose. Do not take double or extra doses. What may interact with this medicine? -alcohol -barbiturate medicines for sleep or seizures -medicines for colds, allergies -medicines for depression, anxiety, or emotional disturbances -medicines for pain -medicines for sleep -muscle relaxants This list may not describe all possible interactions. Give your health care provider a list of all the medicines, herbs, non-prescription drugs, or dietary supplements you use. Also tell them if you smoke, drink alcohol, or use illegal drugs. Some items may interact with your medicine. What should I watch for while using this medicine? Tell your doctor or health care professional if your symptoms do not improve. You may get drowsy or dizzy. Do not drive, use machinery, or do anything that needs mental alertness until you know how this medicine affects you. Do not stand or sit up quickly, especially if you are an older patient. This reduces the risk of dizzy or fainting spells. Alcohol may interfere with the effect of this medicine. Avoid alcoholic drinks. Your mouth may get dry. Chewing sugarless gum or sucking hard candy, and drinking plenty of water may help. Contact your doctor if the problem does not go away or is severe. This medicine may cause dry eyes and blurred vision. If you wear contact lenses you may feel some discomfort. Lubricating drops may help. See your eye doctor if the problem does not go away or is severe. If you are receiving skin tests for allergies, tell your doctor you are using this medicine. What side effects may I notice from receiving this medicine? Side effects that you should report to your doctor or health care professional as soon as possible: -fast or irregular heartbeat -difficulty passing urine -seizures -slurred speech or  confusion -tremor Side effects that usually do not require medical attention (report to your doctor or health care professional if they continue or are bothersome): -constipation -drowsiness -fatigue -headache -stomach upset This list may not describe all possible side effects. Call your doctor for medical advice about side effects. You may report side effects to FDA at 1-800-FDA-1088. Where should I keep my medicine? Keep out of the reach of children. Store at room temperature between 15 and 30 degrees C (59 and 86 degrees F). Keep container tightly closed. Throw away any unused medicine after the expiration date. NOTE: This sheet is a summary. It may not cover all possible information. If you have questions about this medicine, talk to your doctor, pharmacist, or health care provider.  2018 Elsevier/Gold Standard (2008-01-20 14:50:59)

## 2018-06-26 LAB — URINE CULTURE: CULTURE: NO GROWTH

## 2018-06-27 LAB — BILE ACIDS, TOTAL: Bile Acids Total: 10.2 umol/L — ABNORMAL HIGH (ref 0.0–10.0)

## 2018-07-01 ENCOUNTER — Ambulatory Visit (INDEPENDENT_AMBULATORY_CARE_PROVIDER_SITE_OTHER): Admitting: Obstetrics and Gynecology

## 2018-07-01 VITALS — BP 116/64 | HR 96 | Wt 162.1 lb

## 2018-07-01 DIAGNOSIS — R1013 Epigastric pain: Secondary | ICD-10-CM

## 2018-07-01 DIAGNOSIS — Z3493 Encounter for supervision of normal pregnancy, unspecified, third trimester: Secondary | ICD-10-CM

## 2018-07-01 DIAGNOSIS — R7989 Other specified abnormal findings of blood chemistry: Secondary | ICD-10-CM

## 2018-07-01 DIAGNOSIS — Z331 Pregnant state, incidental: Secondary | ICD-10-CM

## 2018-07-01 LAB — POCT URINALYSIS DIPSTICK OB
GLUCOSE, UA: NEGATIVE
Leukocytes, UA: NEGATIVE
Nitrite, UA: NEGATIVE
PH UA: 6.5 (ref 5.0–8.0)
PROTEIN: NEGATIVE
RBC UA: NEGATIVE
Spec Grav, UA: 1.01 (ref 1.010–1.025)
Urobilinogen, UA: 0.2 E.U./dL

## 2018-07-01 NOTE — Progress Notes (Signed)
ROB- pt is doing well 

## 2018-07-01 NOTE — Progress Notes (Signed)
ROB-still itching on hands and extremeties.counseled on elevated bile acids & lab repeated. Will start weekly testing next week. B12 injection given. To start bid [facility]

## 2018-07-03 LAB — BILE ACIDS, TOTAL: Bile Acids Total: 3.6 umol/L (ref 0.0–10.0)

## 2018-07-05 ENCOUNTER — Other Ambulatory Visit: Payer: Self-pay | Admitting: Certified Nurse Midwife

## 2018-07-05 DIAGNOSIS — Z8759 Personal history of other complications of pregnancy, childbirth and the puerperium: Secondary | ICD-10-CM

## 2018-07-07 ENCOUNTER — Other Ambulatory Visit

## 2018-07-07 ENCOUNTER — Ambulatory Visit (INDEPENDENT_AMBULATORY_CARE_PROVIDER_SITE_OTHER): Admitting: Obstetrics and Gynecology

## 2018-07-07 VITALS — BP 91/61 | HR 91 | Wt 163.5 lb

## 2018-07-07 DIAGNOSIS — Z3493 Encounter for supervision of normal pregnancy, unspecified, third trimester: Secondary | ICD-10-CM

## 2018-07-07 DIAGNOSIS — Z3A31 31 weeks gestation of pregnancy: Secondary | ICD-10-CM

## 2018-07-07 DIAGNOSIS — O09293 Supervision of pregnancy with other poor reproductive or obstetric history, third trimester: Secondary | ICD-10-CM | POA: Diagnosis not present

## 2018-07-07 LAB — POCT URINALYSIS DIPSTICK OB
Bilirubin, UA: NEGATIVE
Glucose, UA: NEGATIVE
Ketones, UA: NEGATIVE
LEUKOCYTES UA: NEGATIVE
Nitrite, UA: NEGATIVE
PROTEIN: NEGATIVE
RBC UA: NEGATIVE
Spec Grav, UA: 1.01 (ref 1.010–1.025)
Urobilinogen, UA: 0.2 E.U./dL
pH, UA: 7 (ref 5.0–8.0)

## 2018-07-07 NOTE — Progress Notes (Signed)
ROB & NST- doing well, denies any itching at this time.  NST performed today was reviewed and was found to be reactive. Baseline128 with Moderate variability; No decels noted.  Continue recommended antenatal testing and prenatal care.

## 2018-07-07 NOTE — Progress Notes (Signed)
ROB & NST- pt is doing well

## 2018-07-14 ENCOUNTER — Ambulatory Visit (INDEPENDENT_AMBULATORY_CARE_PROVIDER_SITE_OTHER): Admitting: Certified Nurse Midwife

## 2018-07-14 ENCOUNTER — Other Ambulatory Visit

## 2018-07-14 ENCOUNTER — Ambulatory Visit (INDEPENDENT_AMBULATORY_CARE_PROVIDER_SITE_OTHER)

## 2018-07-14 VITALS — BP 104/57 | HR 89 | Wt 162.4 lb

## 2018-07-14 DIAGNOSIS — Z3A31 31 weeks gestation of pregnancy: Secondary | ICD-10-CM

## 2018-07-14 DIAGNOSIS — O09293 Supervision of pregnancy with other poor reproductive or obstetric history, third trimester: Secondary | ICD-10-CM

## 2018-07-14 DIAGNOSIS — O321XX0 Maternal care for breech presentation, not applicable or unspecified: Secondary | ICD-10-CM | POA: Diagnosis not present

## 2018-07-14 DIAGNOSIS — Z8759 Personal history of other complications of pregnancy, childbirth and the puerperium: Secondary | ICD-10-CM

## 2018-07-14 DIAGNOSIS — Z3493 Encounter for supervision of normal pregnancy, unspecified, third trimester: Secondary | ICD-10-CM

## 2018-07-14 LAB — POCT URINALYSIS DIPSTICK OB
BILIRUBIN UA: NEGATIVE
Blood, UA: NEGATIVE
Glucose, UA: NEGATIVE
KETONES UA: NEGATIVE
Leukocytes, UA: NEGATIVE
Nitrite, UA: NEGATIVE
POC,PROTEIN,UA: NEGATIVE
Spec Grav, UA: 1.02 (ref 1.010–1.025)
UROBILINOGEN UA: 0.2 U/dL
pH, UA: 6 (ref 5.0–8.0)

## 2018-07-14 NOTE — Progress Notes (Signed)
ROB-Reports intermittent sternal pressure. Discussed home treatment measures and probable reasons for symptoms. Rx: albuterol, see orders. Requests Zoloft refill, see orders. NST performed today was reviewed and was found to be reactive. Baseline 120-125 with moderate variability and accelerations noted. Growth [place] today WNL except breech position. Reviewed red flag symptoms and when to call. Continue recommended antenatal testing and prenatal care.    ULTRASOUND REPORT  Location: [facility] Date of Service:  07/14/2018  Indications: Growth; Hx IUFD Findings:  Singleton intrauterine pregnancy is visualized with FHR at 123 BPM. Biometrics give an (U/S) Gestational age of 83 6/7 weeks and an (U/S) EDD of 09/09/18; this correlates with the clinically established EDD of 09/07/18.  Fetal presentation is breech.  EFW: 1855 grams (4lb 1oz).  31st percentile.  FL only measures 30 weeks. Placenta: Posterior and grade 1-2. AFI: 17.1 cm.  Fetal stomach, kidneys, and bladder appear WNL.  Impression: 1. 31 6/7 week Viable Singleton Intrauterine pregnancy by U/S. 2. (U/S) EDD is consistent with Clinically established (LMP) EDD of 09/07/18. 3. EFW: 1855 grams (4lb 1oz).  31st percentile.  FL only measures 30 weeks. 4. BREECH presentation  Recommendations: 1.Clinical correlation with the patient's History and Physical Exam.  Dario Ave, RDMS

## 2018-07-14 NOTE — Progress Notes (Signed)
ROB NST 

## 2018-07-14 NOTE — Patient Instructions (Addendum)
Fetal Movement Counts Patient Name: ________________________________________________ Patient Due Date: ____________________ What is a fetal movement count? A fetal movement count is the number of times that you feel your baby move during a certain amount of time. This may also be called a fetal kick count. A fetal movement count is recommended for every pregnant woman. You may be asked to start counting fetal movements as early as week 28 of your pregnancy. Pay attention to when your baby is most active. You may notice your baby's sleep and wake cycles. You may also notice things that make your baby move more. You should do a fetal movement count:  When your baby is normally most active.  At the same time each day.  A good time to count movements is while you are resting, after having something to eat and drink. How do I count fetal movements? 1. Find a quiet, comfortable area. Sit, or lie down on your side. 2. Write down the date, the start time and stop time, and the number of movements that you felt between those two times. Take this information with you to your health care visits. 3. For 2 hours, count kicks, flutters, swishes, rolls, and jabs. You should feel at least 10 movements during 2 hours. 4. You may stop counting after you have felt 10 movements. 5. If you do not feel 10 movements in 2 hours, have something to eat and drink. Then, keep resting and counting for 1 hour. If you feel at least 4 movements during that hour, you may stop counting. Contact a health care provider if:  You feel fewer than 4 movements in 2 hours.  Your baby is not moving like he or she usually does. Date: ____________ Start time: ____________ Stop time: ____________ Movements: ____________ Date: ____________ Start time: ____________ Stop time: ____________ Movements: ____________ Date: ____________ Start time: ____________ Stop time: ____________ Movements: ____________ Date: ____________ Start time:  ____________ Stop time: ____________ Movements: ____________ Date: ____________ Start time: ____________ Stop time: ____________ Movements: ____________ Date: ____________ Start time: ____________ Stop time: ____________ Movements: ____________ Date: ____________ Start time: ____________ Stop time: ____________ Movements: ____________ Date: ____________ Start time: ____________ Stop time: ____________ Movements: ____________ Date: ____________ Start time: ____________ Stop time: ____________ Movements: ____________ This information is not intended to replace advice given to you by your health care provider. Make sure you discuss any questions you have with your health care provider. Document Released: 10/07/2006 Document Revised: 05/06/2016 Document Reviewed: 10/17/2015 Elsevier Interactive Patient Education  2018 [employer]. Sertraline tablets What is this medicine? SERTRALINE (SER tra leen) is used to treat depression. It may also be used to treat obsessive compulsive disorder, panic disorder, post-trauma stress, premenstrual dysphoric disorder (PMDD) or social anxiety. This medicine may be used for other purposes; ask your health care provider or pharmacist if you have questions. COMMON BRAND NAME(S): Zoloft What should I tell my health care provider before I take this medicine? They need to know if you have any of these conditions: -bleeding disorders -bipolar disorder or a family history of bipolar disorder -glaucoma -heart disease -high blood pressure -history of irregular heartbeat -history of low levels of calcium, magnesium, or potassium in the blood -if you often drink alcohol -liver disease -receiving electroconvulsive therapy -seizures -suicidal thoughts, plans, or attempt; a previous suicide attempt by you or a family member -take medicines that treat or prevent blood clots -thyroid disease -an unusual or allergic reaction to sertraline, other medicines, foods, dyes, or  preservatives -pregnant or trying  to get pregnant -breast-feeding How should I use this medicine? Take this medicine by mouth with a glass of water. Follow the directions on the prescription label. You can take it with or without food. Take your medicine at regular intervals. Do not take your medicine more often than directed. Do not stop taking this medicine suddenly except upon the advice of your doctor. Stopping this medicine too quickly may cause serious side effects or your condition may worsen. A special MedGuide will be given to you by the pharmacist with each prescription and refill. Be sure to read this information carefully each time. Talk to your pediatrician regarding the use of this medicine in children. While this drug may be prescribed for children as young as 7 years for selected conditions, precautions do apply. Overdosage: If you think you have taken too much of this medicine contact a poison control center or emergency room at once. NOTE: This medicine is only for you. Do not share this medicine with others. What if I miss a dose? If you miss a dose, take it as soon as you can. If it is almost time for your next dose, take only that dose. Do not take double or extra doses. What may interact with this medicine? Do not take this medicine with any of the following medications: -cisapride -dofetilide -dronedarone -linezolid -MAOIs like Carbex, Eldepryl, Marplan, Nardil, and Parnate -methylene blue (injected into a vein) -pimozide -thioridazine This medicine may also interact with the following medications: -alcohol -amphetamines -aspirin and aspirin-like medicines -certain medicines for depression, anxiety, or psychotic disturbances -certain medicines for fungal infections like ketoconazole, fluconazole, posaconazole, and itraconazole -certain medicines for irregular heart beat like flecainide, quinidine, propafenone -certain medicines for migraine headaches like  almotriptan, eletriptan, frovatriptan, naratriptan, rizatriptan, sumatriptan, zolmitriptan -certain medicines for sleep -certain medicines for seizures like carbamazepine, valproic acid, phenytoin -certain medicines that treat or prevent blood clots like warfarin, enoxaparin, dalteparin -cimetidine -digoxin -diuretics -fentanyl -isoniazid -lithium -NSAIDs, medicines for pain and inflammation, like ibuprofen or naproxen -other medicines that prolong the QT interval (cause an abnormal heart rhythm) -rasagiline -safinamide -supplements like St. John's wort, kava kava, valerian -tolbutamide -tramadol -tryptophan This list may not describe all possible interactions. Give your health care provider a list of all the medicines, herbs, non-prescription drugs, or dietary supplements you use. Also tell them if you smoke, drink alcohol, or use illegal drugs. Some items may interact with your medicine. What should I watch for while using this medicine? Tell your doctor if your symptoms do not get better or if they get worse. Visit your doctor or health care professional for regular checks on your progress. Because it may take several weeks to see the full effects of this medicine, it is important to continue your treatment as prescribed by your doctor. Patients and their families should watch out for new or worsening thoughts of suicide or depression. Also watch out for sudden changes in feelings such as feeling anxious, agitated, panicky, irritable, hostile, aggressive, impulsive, severely restless, overly excited and hyperactive, or not being able to sleep. If this happens, especially at the beginning of treatment or after a change in dose, call your health care professional. Dennis Bast may get drowsy or dizzy. Do not drive, use machinery, or do anything that needs mental alertness until you know how this medicine affects you. Do not stand or sit up quickly, especially if you are an older patient. This reduces  the risk of dizzy or fainting spells. Alcohol may interfere with the  effect of this medicine. Avoid alcoholic drinks. Your mouth may get dry. Chewing sugarless gum or sucking hard candy, and drinking plenty of water may help. Contact your doctor if the problem does not go away or is severe. What side effects may I notice from receiving this medicine? Side effects that you should report to your doctor or health care professional as soon as possible: -allergic reactions like skin rash, itching or hives, swelling of the face, lips, or tongue -anxious -black, tarry stools -changes in vision -confusion -elevated mood, decreased need for sleep, racing thoughts, impulsive behavior -eye pain -fast, irregular heartbeat -feeling faint or lightheaded, falls -feeling agitated, angry, or irritable -hallucination, loss of contact with reality -loss of balance or coordination -loss of memory -painful or prolonged erections -restlessness, pacing, inability to keep still -seizures -stiff muscles -suicidal thoughts or other mood changes -trouble sleeping -unusual bleeding or bruising -unusually weak or tired -vomiting Side effects that usually do not require medical attention (report to your doctor or health care professional if they continue or are bothersome): -change in appetite or weight -change in sex drive or performance -diarrhea -increased sweating -indigestion, nausea -tremors This list may not describe all possible side effects. Call your doctor for medical advice about side effects. You may report side effects to FDA at 1-800-FDA-1088. Where should I keep my medicine? Keep out of the reach of children. Store at room temperature between 15 and 30 degrees C (59 and 86 degrees F). Throw away any unused medicine after the expiration date. NOTE: This sheet is a summary. It may not cover all possible information. If you have questions about this medicine, talk to your doctor, pharmacist, or  health care provider.  2018 Elsevier/Gold Standard (2016-09-11 14:17:49) Costochondritis Costochondritis is swelling and irritation (inflammation) of the tissue (cartilage) that connects your ribs to your breastbone (sternum). This causes pain in the front of your chest. The pain usually starts gradually and involves more than one rib. What are the causes? The exact cause of this condition is not always known. It results from stress on the cartilage where your ribs attach to your sternum. The cause of this stress could be:  Chest injury (trauma).  Exercise or activity, such as lifting.  Severe coughing.  What increases the risk? You may be at higher risk for this condition if you:  Are female.  Are 73?29 years old.  Recently started a new exercise or work activity.  Have low levels of vitamin D.  Have a condition that makes you cough frequently.  What are the signs or symptoms? The main symptom of this condition is chest pain. The pain:  Usually starts gradually and can be sharp or dull.  Gets worse with deep breathing, coughing, or exercise.  Gets better with rest.  May be worse when you press on the sternum-rib connection (tenderness).  How is this diagnosed? This condition is diagnosed based on your symptoms, medical history, and a physical exam. Your health care provider will check for tenderness when pressing on your sternum. This is the most important finding. You may also have tests to rule out other causes of chest pain. These may include:  A chest X-ray to check for lung problems.  An electrocardiogram (ECG) to see if you have a heart problem that could be causing the pain.  An imaging scan to rule out a chest or rib fracture.  How is this treated? This condition usually goes away on its own over time. Your health care  provider may prescribe an NSAID to reduce pain and inflammation. Your health care provider may also suggest that you:  Rest and avoid  activities that make pain worse.  Apply heat or cold to the area to reduce pain and inflammation.  Do exercises to stretch your chest muscles.  If these treatments do not help, your health care provider may inject a numbing medicine at the sternum-rib connection to help relieve the pain. Follow these instructions at home:  Avoid activities that make pain worse. This includes any activities that use chest, abdominal, and side muscles.  If directed, put ice on the painful area: ? Put ice in a plastic bag. ? Place a towel between your skin and the bag. ? Leave the ice on for 20 minutes, 2-3 times a day.  If directed, apply heat to the affected area as often as told by your health care provider. Use the heat source that your health care provider recommends, such as a moist heat pack or a heating pad. ? Place a towel between your skin and the heat source. ? Leave the heat on for 20-30 minutes. ? Remove the heat if your skin turns bright red. This is especially important if you are unable to feel pain, heat, or cold. You may have a greater risk of getting burned.  Take over-the-counter and prescription medicines only as told by your health care provider.  Return to your normal activities as told by your health care provider. Ask your health care provider what activities are safe for you.  Keep all follow-up visits as told by your health care provider. This is important. Contact a health care provider if:  You have chills or a fever.  Your pain does not go away or it gets worse.  You have a cough that does not go away (is persistent). Get help right away if:  You have shortness of breath. This information is not intended to replace advice given to you by your health care provider. Make sure you discuss any questions you have with your health care provider. Document Released: 06/17/2005 Document Revised: 03/27/2016 Document Reviewed: 01/01/2016 Elsevier Interactive Patient Education   2018 [employer]. Skin Conditions During Pregnancy Pregnancy affects many parts of your body. One part is your skin. Most skin problems that develop during pregnancy are not serious and are considered a normal part of pregnancy. They go away on their own after the baby is born. Other skin problems may need treatment. What type of skin problems can develop during pregnancy?  Stretch marks. Stretch marks are purple or pink lines on the skin. They may appear on the belly, breasts, thighs, or buttocks. Stretch marks are caused by weight gain that causes the skin to stretch. Stretch marks do not cause problems. Almost all women get them during pregnancy.  Darkening of the skin (hyperpigmentation). The darkening may occur in patches or as a line. Patches may appear on the face, nipples, or genital area. Lines often stretch from the belly button to the pubic area. Hyperpigmentation develops in almost all pregnant women. It is more severe in women with a dark complexion.  Spider angiomas. These are tiny pink or red lines that go out from a center point, like the legs of a spider. Usually, they are on the face, neck, and arms. They do not cause problems. They are most common in women with light complexions.  Palmar erythema. This is a reddening of the palms. It is most common in women with light  complexions.  Swelling and redness. This can occur on the face, eyelids, fingers, or toes.  Pruritic urticarial papules and plaques of pregnancy (PUPPP). This is a rash that is itchy, red, and has tiny blisters. The cause is unknown. It usually starts on the abdomen and may affect the arms or legs. It does not affect the face. It usually begins later in pregnancy. About a third of all pregnant women develop this condition. There are no associated problems to the fetus with this rash. Sometimes, oral steroids are used to calm down the itch. The rash clears after the baby is born.  Prurigo of pregnancy. This is a  disease in which red patches and bumps appear on the arms and legs. The cause is unknown. The patches and bumps clear after the baby is born. About a third of pregnant women develop this disease.  Acne. Pimples may develop, including in women who have had clear skin for a long time.  Skin tags. These are small flaps of skin that stick out from the body. They may grow or become darker during pregnancy. They are usually harmless.  Moles. These are flat or slightly raised growths. They are usually round and pink or brown. They may grow or become darker during pregnancy.  Intrahepatic cholestasis of pregnancy. This is a rare condition that causes itchy skin. It may run in families. It increases the risk of complications for the fetus. This condition usually resolves after delivery. It can recur with subsequent pregnancies.  Impetigo herpetiformis. This is a form of a severe skin disease called pustular psoriasis. Usually, delivery is the only method of resolving the condition.  Pruritic folliculitis of pregnancy. This is a rare condition that causes pimple-like skin growths. It develops in the middle or later stages of pregnancy. Its cause is unknown.It usually resolves 2-3 weeks after delivery.  Pemphigoid gestationis. This is a very rare autoimmune disease. It causes a severely itchy rash and blisters. The rash does not appear on the face, scalp, or inside of the mouth. It usually resolves 3 months after delivery. It may recur with subsequent pregnancies. Some pre-existing skin conditions, such as atopic dermatitis, may become worse during pregnancy. Follow these instructions at home: Different conditions may have different instructions. In general:  Follow all your health care provider's directions about medicines to treat skin problems while you are pregnant. Do not use any over-the-counter medicines (including medicated creams and lotions) until you have checked with your health care provider.  Many medicines are not safe to use when you are pregnant.  Avoid time in the sun. This will help keep your skin from darkening. When you must be outside, use sunscreen and wear a hat with a wide brim to protect your face. The sunscreen should have a SPF of at least 2. This may help limit dark spots that develop when the skin is exposed to the sun.  To avoid problems from stretched skin: ? Do not sit or stand for long periods of time. ? Exercise regularly. This helps keep your skin in good condition.  Use a gentle soap. This helps prevent acne.  Do not get too hot or too sweaty. This makes some skin rashes worse.  Wear loose clothes made of a soft fabric. This prevents skin irritation.  For itching, add oatmeal or cornstarch to your bathwater.  Use a skin moisturizer. Ask your health care provider for suggestions.  This information is not intended to replace advice given to you by your health  care provider. Make sure you discuss any questions you have with your health care provider. Document Released: 10/10/2010 Document Revised: 02/13/2016 Document Reviewed: 06/19/2013 Elsevier Interactive Patient Education  [employer].

## 2018-07-19 ENCOUNTER — Other Ambulatory Visit: Payer: Self-pay | Admitting: Obstetrics and Gynecology

## 2018-07-19 ENCOUNTER — Other Ambulatory Visit: Payer: Self-pay | Admitting: Certified Nurse Midwife

## 2018-07-19 ENCOUNTER — Other Ambulatory Visit

## 2018-07-19 DIAGNOSIS — Z3493 Encounter for supervision of normal pregnancy, unspecified, third trimester: Secondary | ICD-10-CM

## 2018-07-19 DIAGNOSIS — R7989 Other specified abnormal findings of blood chemistry: Secondary | ICD-10-CM

## 2018-07-21 ENCOUNTER — Other Ambulatory Visit

## 2018-07-21 ENCOUNTER — Ambulatory Visit (INDEPENDENT_AMBULATORY_CARE_PROVIDER_SITE_OTHER): Admitting: Obstetrics and Gynecology

## 2018-07-21 VITALS — BP 103/62 | HR 91 | Wt 162.6 lb

## 2018-07-21 DIAGNOSIS — O09213 Supervision of pregnancy with history of pre-term labor, third trimester: Secondary | ICD-10-CM

## 2018-07-21 DIAGNOSIS — Z3A32 32 weeks gestation of pregnancy: Secondary | ICD-10-CM | POA: Diagnosis not present

## 2018-07-21 DIAGNOSIS — Z3493 Encounter for supervision of normal pregnancy, unspecified, third trimester: Secondary | ICD-10-CM

## 2018-07-21 LAB — BILE ACIDS, TOTAL: Bile Acids Total: 5.8 umol/L (ref 0.0–10.0)

## 2018-07-21 LAB — POCT URINALYSIS DIPSTICK OB
BILIRUBIN UA: NEGATIVE
Blood, UA: NEGATIVE
GLUCOSE, UA: NEGATIVE
Ketones, UA: NEGATIVE
LEUKOCYTES UA: NEGATIVE
Nitrite, UA: NEGATIVE
PH UA: 6 (ref 5.0–8.0)
POC,PROTEIN,UA: NEGATIVE
Spec Grav, UA: 1.01 (ref 1.010–1.025)
Urobilinogen, UA: 0.2 E.U./dL

## 2018-07-21 LAB — FETAL FIBRONECTIN: FETAL FIBRONECTIN: NEGATIVE

## 2018-07-21 MED ORDER — SERTRALINE HCL 100 MG PO TABS: 100.0000 mg | ORAL_TABLET | Freq: Every day | ORAL | 2 refills | Status: DC

## 2018-07-21 NOTE — Progress Notes (Signed)
ROB & NST- FFN obtained and patient placed on pelvic rest, h/o PTL with last pregnancy around this time. First two deliveries at 38 weeks spontaneous labor. NST performed today was reviewed and was found to be reactive. Baseline125 with Moderate variability; No decels noted.uterine mild contractions noted every 3 minutes.  Continue recommended antenatal testing and prenatal care.

## 2018-07-21 NOTE — Progress Notes (Signed)
ROB NST 

## 2018-07-28 ENCOUNTER — Other Ambulatory Visit

## 2018-07-28 ENCOUNTER — Ambulatory Visit (INDEPENDENT_AMBULATORY_CARE_PROVIDER_SITE_OTHER): Admitting: Obstetrics and Gynecology

## 2018-07-28 VITALS — BP 102/60 | HR 121 | Wt 164.0 lb

## 2018-07-28 DIAGNOSIS — Z3493 Encounter for supervision of normal pregnancy, unspecified, third trimester: Secondary | ICD-10-CM

## 2018-07-28 DIAGNOSIS — O09213 Supervision of pregnancy with history of pre-term labor, third trimester: Secondary | ICD-10-CM | POA: Diagnosis not present

## 2018-07-28 DIAGNOSIS — E538 Deficiency of other specified B group vitamins: Secondary | ICD-10-CM | POA: Diagnosis not present

## 2018-07-28 LAB — POCT URINALYSIS DIPSTICK OB
Bilirubin, UA: NEGATIVE
Blood, UA: NEGATIVE
Glucose, UA: NEGATIVE
Ketones, UA: NEGATIVE
NITRITE UA: NEGATIVE
PROTEIN: NEGATIVE
Spec Grav, UA: 1.01 (ref 1.010–1.025)
Urobilinogen, UA: 0.2 E.U./dL
pH, UA: 7 (ref 5.0–8.0)

## 2018-07-28 MED ORDER — CYANOCOBALAMIN 1000 MCG/ML IJ SOLN
1000.0000 ug | Freq: Once | INTRAMUSCULAR | Status: AC
  Administered 2018-07-28: 1000 ug via INTRAMUSCULAR

## 2018-07-28 MED ORDER — BETAMETHASONE SOD PHOS & ACET 6 (3-3) MG/ML IJ SUSP
12.0000 mg | Freq: Once | INTRAMUSCULAR | Status: AC
  Administered 2018-07-28: 12 mg via INTRAMUSCULAR

## 2018-07-28 NOTE — Progress Notes (Signed)
ROB and NST- reports having regular contractions all night and now this morning although they are less intense this morning.  NST performed today was reviewed and was found to be reactive. Baseline 125-130 with Moderate variability; No decels noted. Uterine contractions every 2-3 minutes, mild to palpation. With continued and at times increased intensity to contractions, decision was made to give betamethasone injections- 1st one given and will return tomorrow for next one.   Continue recommended antenatal testing and prenatal care.

## 2018-07-29 ENCOUNTER — Ambulatory Visit (INDEPENDENT_AMBULATORY_CARE_PROVIDER_SITE_OTHER): Admitting: Obstetrics and Gynecology

## 2018-07-29 VITALS — BP 108/70 | HR 98 | Ht 62.0 in | Wt 161.6 lb

## 2018-07-29 DIAGNOSIS — O09213 Supervision of pregnancy with history of pre-term labor, third trimester: Secondary | ICD-10-CM | POA: Diagnosis not present

## 2018-07-29 MED ORDER — BETAMETHASONE SOD PHOS & ACET 6 (3-3) MG/ML IJ SUSP
12.0000 mg | Freq: Once | INTRAMUSCULAR | Status: AC
  Administered 2018-07-29: 12 mg via INTRAMUSCULAR

## 2018-07-29 NOTE — Progress Notes (Signed)
Pt is present today for steroid injections. Pt stated that she is feeling tried,weak, disoriented and not feeling like her self since the first injection. Pt's last injections was 07/28/18.   BP 108/70   Pulse 98   Ht 5\' 2"  (1.575 m)   Wt 161 lb 9.6 oz (73.3 kg)   LMP 08/20/2017   BMI 29.56 kg/m

## 2018-07-30 ENCOUNTER — Emergency Department

## 2018-07-30 ENCOUNTER — Other Ambulatory Visit: Payer: Self-pay

## 2018-07-30 ENCOUNTER — Encounter: Payer: Self-pay | Admitting: Emergency Medicine

## 2018-07-30 ENCOUNTER — Encounter: Payer: Self-pay | Admitting: *Deleted

## 2018-07-30 ENCOUNTER — Observation Stay
Admission: EM | Admit: 2018-07-30 | Discharge: 2018-07-30 | Disposition: A | Discharge status: Home / Self Care | Attending: Obstetrics and Gynecology | Admitting: Obstetrics and Gynecology

## 2018-07-30 ENCOUNTER — Emergency Department
Admission: EM | Admit: 2018-07-30 | Discharge: 2018-07-30 | Disposition: A | Discharge status: Home / Self Care | Source: Home / Self Care | Attending: Emergency Medicine | Admitting: Emergency Medicine

## 2018-07-30 DIAGNOSIS — Z3A34 34 weeks gestation of pregnancy: Secondary | ICD-10-CM

## 2018-07-30 DIAGNOSIS — R079 Chest pain, unspecified: Secondary | ICD-10-CM | POA: Insufficient documentation

## 2018-07-30 DIAGNOSIS — O99283 Endocrine, nutritional and metabolic diseases complicating pregnancy, third trimester: Secondary | ICD-10-CM | POA: Insufficient documentation

## 2018-07-30 DIAGNOSIS — E282 Polycystic ovarian syndrome: Secondary | ICD-10-CM | POA: Diagnosis not present

## 2018-07-30 DIAGNOSIS — O368130 Decreased fetal movements, third trimester, not applicable or unspecified: Secondary | ICD-10-CM

## 2018-07-30 DIAGNOSIS — O09293 Supervision of pregnancy with other poor reproductive or obstetric history, third trimester: Secondary | ICD-10-CM | POA: Diagnosis not present

## 2018-07-30 DIAGNOSIS — O4703 False labor before 37 completed weeks of gestation, third trimester: Principal | ICD-10-CM | POA: Insufficient documentation

## 2018-07-30 DIAGNOSIS — G8929 Other chronic pain: Secondary | ICD-10-CM | POA: Insufficient documentation

## 2018-07-30 DIAGNOSIS — O99353 Diseases of the nervous system complicating pregnancy, third trimester: Secondary | ICD-10-CM | POA: Insufficient documentation

## 2018-07-30 DIAGNOSIS — O9989 Other specified diseases and conditions complicating pregnancy, childbirth and the puerperium: Secondary | ICD-10-CM

## 2018-07-30 DIAGNOSIS — Z3490 Encounter for supervision of normal pregnancy, unspecified, unspecified trimester: Secondary | ICD-10-CM

## 2018-07-30 DIAGNOSIS — E538 Deficiency of other specified B group vitamins: Secondary | ICD-10-CM

## 2018-07-30 DIAGNOSIS — Z79899 Other long term (current) drug therapy: Secondary | ICD-10-CM

## 2018-07-30 LAB — COMPREHENSIVE METABOLIC PANEL
ALT: 10 U/L (ref 0–44)
AST: 17 U/L (ref 15–41)
Albumin: 3.3 g/dL — ABNORMAL LOW (ref 3.5–5.0)
Alkaline Phosphatase: 129 U/L — ABNORMAL HIGH (ref 38–126)
Anion gap: 10 (ref 5–15)
BUN: 6 mg/dL (ref 6–20)
CALCIUM: 8.9 mg/dL (ref 8.9–10.3)
CO2: 23 mmol/L (ref 22–32)
CREATININE: 0.48 mg/dL (ref 0.44–1.00)
Chloride: 104 mmol/L (ref 98–111)
Glucose, Bld: 97 mg/dL (ref 70–99)
Potassium: 3.5 mmol/L (ref 3.5–5.1)
SODIUM: 137 mmol/L (ref 135–145)
Total Bilirubin: 0.9 mg/dL (ref 0.3–1.2)
Total Protein: 6.9 g/dL (ref 6.5–8.1)

## 2018-07-30 LAB — CBC
HCT: 29.5 % — ABNORMAL LOW (ref 36.0–46.0)
HEMOGLOBIN: 9 g/dL — AB (ref 12.0–15.0)
MCH: 23.9 pg — AB (ref 26.0–34.0)
MCHC: 30.5 g/dL (ref 30.0–36.0)
MCV: 78.5 fL — AB (ref 80.0–100.0)
Platelets: 239 10*3/uL (ref 150–400)
RBC: 3.76 MIL/uL — ABNORMAL LOW (ref 3.87–5.11)
RDW: 14.4 % (ref 11.5–15.5)
WBC: 14.4 10*3/uL — ABNORMAL HIGH (ref 4.0–10.5)
nRBC: 0 % (ref 0.0–0.2)

## 2018-07-30 LAB — TROPONIN I: Troponin I: <0.03 ng/mL (ref <0.03)

## 2018-07-30 LAB — LIPASE, BLOOD: Lipase: 24 U/L (ref 11–51)

## 2018-07-30 MED ORDER — IOPAMIDOL (ISOVUE-370) INJECTION 76%
75.0000 mL | Freq: Once | INTRAVENOUS | Status: AC | PRN
  Administered 2018-07-30: 75 mL via INTRAVENOUS

## 2018-07-30 NOTE — ED Triage Notes (Signed)
Supra sternal chest pain began at 10p last night. States has felt SOB whole pregnancy, states not increased. Slight increase when takes deep breath. Denies fevers or cough. States is [redacted] weeks pregnant. States also has had contractions since 10p yesterday, was seen [facility] and came here [facility].

## 2018-07-30 NOTE — ED Notes (Signed)
.   Pt is resting, Respirations even and unlabored, NAD. Stretcher lowest postion and locked. Call bell within reach. Denies any needs at this time RN will continue to monitor.    

## 2018-07-30 NOTE — ED Provider Notes (Signed)
[facility] Emergency Department Provider Note  Time seen: 12:23 PM  I have reviewed the triage vital signs and the nursing notes.   HISTORY  Chief Complaint Chest Pain    HPI Stephanie Logan is a 27 y.o. female G4, P2 A1 (stillborn), presents to the emergency department for chest discomfort.  According to the patient she began having contractions last night which she describes as lower abdominal contractions occurring every 8 to 10 minutes.  States last night she also began developing chest pain/discomfort with her contractions.  States very minimal discomfort between contractions in the chest but then will develop pain in the chest during contractions.  No significant pleuritic pain, no cough congestion or fever.  No trouble breathing nausea or diaphoresis.  No leg pain or swelling.  No history of DVT.  Patient went to labor and delivery for evaluation, states she was only 1 cm dilated they recommended she come to the emergency department for her chest discomfort.   Past Medical History:  Diagnosis Date  . Amenorrhea   . Anxiety   . PCOS (polycystic ovarian syndrome)   . Post partum depression     Patient Active Problem List   Diagnosis Date Noted  . Pregnancy 07/30/2018  . Breech presentation, no version 07/14/2018  . Indication for care in labor and delivery, antepartum 06/25/2018  . Kidney stone complicating pregnancy 35/57/3220  . Vitamin B 12 deficiency 05/30/2018  . Indication for care in labor or delivery 05/26/2018  . IUFD at 28 weeks or more of gestation 02/07/2017  . Chronic pain of right hip 02/18/2016  . Chronic pain of right knee 02/18/2016  . Depression with anxiety 02/18/2016  . Severe episode of recurrent major depressive disorder, without psychotic features ([place])   . PCOS (polycystic ovarian syndrome) 07/19/2015    Past Surgical History:  Procedure Laterality Date  . BREAST FIBROADENOMA SURGERY  2008,2010    Prior to Admission  medications   Medication Sig Start Date End Date Taking? Authorizing Provider  hydrOXYzine (ATARAX/VISTARIL) 25 MG tablet Take 1 tablet (25 mg total) by mouth every 6 (six) hours as needed (contractions). 05/27/18   Diona Fanti, CNM  Prenatal Vit-Fe Fumarate-FA (PRENATAL MULTIVITAMIN) TABS tablet Take 1 tablet by mouth daily at 12 noon.    [provider]  sertraline (ZOLOFT) 100 MG tablet Take 1 tablet (100 mg total) by mouth daily. 07/21/18   Shambley, Melody N, CNM  thiamine (VITAMIN B-1) 100 MG tablet Take 100 mg by mouth daily.    [provider]    Allergies  Allergen Reactions  . Ciprofloxacin Rash    Family History  Problem Relation Age of Onset  . Cancer Maternal Grandmother        brain  . Cancer Mother        cervical, precancerous  . Seizures Sister     Social History Social History   Tobacco Use  . Smoking status: Never Smoker  . Smokeless tobacco: Never Used  Substance Use Topics  . Alcohol use: Not Currently    Comment: occas  . Drug use: No    Review of Systems Constitutional: Negative for fever. Cardiovascular: Positive for chest pain worse with contractions Respiratory: Negative for shortness of breath. Gastrointestinal: Positive for lower abdominal pain/contractions Genitourinary: Negative for urinary compaints Musculoskeletal: Negative for musculoskeletal complaints Skin: Negative for skin complaints  Neurological: Negative for headache All other ROS negative  ____________________________________________   PHYSICAL EXAM:  Constitutional: Alert and oriented.  Well appearing and in no distress. Eyes: Normal exam ENT   Head: Normocephalic and atraumatic.   Mouth/Throat: Mucous membranes are moist. Cardiovascular: Normal rate, regular rhythm. No murmur Respiratory: Normal respiratory effort without tachypnea nor retractions. Breath sounds are clear Gastrointestinal: Gravid abdomen. Musculoskeletal: Nontender  with normal range of motion in all extremities. No lower extremity tenderness or edema. Neurologic:  Normal speech and language. No gross focal neurologic deficits  Skin:  Skin is warm, dry and intact.  Psychiatric: Mood and affect are normal.   ____________________________________________    EKG  EKG reviewed and interpreted by myself shows a normal sinus rhythm at 96 bpm with a narrow QRS, normal axis, normal intervals, no concerning ST changes.  ____________________________________________    RADIOLOGY Chest x-ray negative  ____________________________________________   INITIAL IMPRESSION / ASSESSMENT AND PLAN / ED COURSE  Pertinent labs & imaging results that were available during my care of the patient were reviewed by me and considered in my medical decision making (see chart for details).  Patient presents to the emergency department for chest discomfort that is largely intermittent worse with contractions.  States she has been having lower abdominal contractions every 8 to 10 minutes since last night.  Went to labor and delivery for evaluation, but was only 1 cm dilated and was sent home but they stated she should go to the emergency department for evaluation of her chest pain prior to going home.  Patient states very minimal discomfort between contractions.  Denies any shortness of breath nausea or diaphoresis at this time.  Overall the patient appears well, no leg pain or swelling, no prior DVTs.  Heart rate is around 90 bpm currently with a 99% room air saturation.  This does not appear to have significant pleuritic pain.  We will obtain labs including cardiac enzymes and a chest x-ray.  EKG is reassuring.  Chest x-ray is negative.  Patient's lab work is reassuring, troponin is negative.  Patient does have a moderate leukocytosis a white blood cell count of 14,000 currently, this could possibly be related to her pregnancy as well as active contractions.  Blood pressure  currently 106/73, heart rate remains around 90 bpm, continues to have a 99 to 100% room air saturation.  I have had a long discussion with the patient and her husband regarding the possibility of pulmonary embolism although it is a very low likelihood, but given she is pregnant with central chest pain states now it feels like it might be somewhat worse with deep inspiration.  We will proceed with CT imaging.  I discussed the risk and benefit, patient agreeable to proceed.  We will shield the fetus of course.  CT angiography is negative for PE.  Largely negative exam.  Patient is very reassured.  We will discharge with OB follow-up.  ____________________________________________   FINAL CLINICAL IMPRESSION(S) / ED DIAGNOSES  Chest pain Contractions    Harvest Dark, MD 07/30/18 1523

## 2018-07-30 NOTE — OB Triage Note (Addendum)
Patient to OBS 4 with complaint of decreased fetal movement, mid sternal chest pain 3/10 and contractions since 11pm Friday night rated 3/10. No LOF or bleeding.  Patient placed on toco and fhr monitors. Notified CNM [place] for orders.  Order to check cervix received.(11:20)  NST reactive.  Patient discharged and taken to ED for chest discomfort 3/10 evaluation.

## 2018-08-01 DIAGNOSIS — R898 Other abnormal findings in specimens from other organs, systems and tissues: Secondary | ICD-10-CM | POA: Insufficient documentation

## 2018-08-01 DIAGNOSIS — D1803 Hemangioma of intra-abdominal structures: Secondary | ICD-10-CM | POA: Insufficient documentation

## 2018-08-01 DIAGNOSIS — Z3A34 34 weeks gestation of pregnancy: Secondary | ICD-10-CM | POA: Insufficient documentation

## 2018-08-02 NOTE — Discharge Summary (Signed)
Stephanie Logan is a 27 y.o. [phone] at [redacted]w[redacted]d who is admitted for observaion of irregular preterm contractions and decreased fetal movement today. Had received 2 doses of betamethasone over the past 2 days.  Estimated Date of Delivery: 09/07/18 Fetal presentation is cephalic.  Length of Stay:  0 Days. Admitted 07/30/2018  Subjective: Reports increased pressure with contractions every 5-6 minutes at home, with decreased fetal movement over last week. N0 bleeding and no loss of fluid per vagina. Also reports hightened anxiety due to fetal loss at 36 weeks with similar symptoms (NCx3 and polyhydramnious) states she feels increased chest pressure and fast heart rate.  Vitals:  Blood pressure 109/70, pulse (!) 101, temperature 98 F (36.7 C), temperature source Oral, resp. rate 16, height 5\' 2"  (1.575 m), weight 73 kg, last menstrual period 08/20/2017. Physical Examination: CONSTITUTIONAL: Well-developed, well-nourished female in no acute distress.  SKIN: Skin is warm and dry. No rash noted. Not diaphoretic. No erythema. No pallor. [place]: Alert and oriented to person, place, and time. Normal reflexes, muscle tone coordination. No cranial nerve deficit noted. PSYCHIATRIC: Anxious mood and affect. Normal behavior. Normal judgment and thought content. CARDIOVASCULAR: Normal heart rate noted, regular rhythm RESPIRATORY: Effort and breath sounds normal, no problems with respiration noted MUSCULOSKELETAL: Normal range of motion. No edema and no tenderness. 2+ distal pulses. ABDOMEN: Soft, nontender, nondistended, gravid. CERVIX: Dilation: Fingertip Effacement (%): 40, 50 Station: Ballotable Exam by:: J.Braddy RN  Fetal monitoring: FHR: 128 bpm, Variability: moderate, Accelerations: Present, Decelerations: Absent negative spontaneous OCT, Uterine activity: 5-7 contractions per hour, mild to palpation and no change from earlier this week.   No results found for this or any previous visit (from the  past 48 hour(s)).  No results found.  Current scheduled medications   I have reviewed the patient's current medications.  ASSESSMENT: Patient Active Problem List   Diagnosis Date Noted  . Pregnancy 07/30/2018  . Breech presentation, no version 07/14/2018  . Indication for care in labor and delivery, antepartum 06/25/2018  . Kidney stone complicating pregnancy 20/94/7096  . Vitamin B 12 deficiency 05/30/2018  . Indication for care in labor or delivery 05/26/2018  . IUFD at 34 weeks or more of gestation 02/07/2017  . Chronic pain of right hip 02/18/2016  . Chronic pain of right knee 02/18/2016  . Depression with anxiety 02/18/2016  . Severe episode of recurrent major depressive disorder, without psychotic features ([place])   . PCOS (polycystic ovarian syndrome) 07/19/2015    PLAN: Reassured of normal findings. To continue pelvic rest and modified bedrest. Offered evaluation in ED of chest pain and declined. Discharged home with instructions to return if symptoms worsen. Continue routine antenatal   Elfreda Blanchet [address], CNM [facility]

## 2018-08-04 ENCOUNTER — Ambulatory Visit (INDEPENDENT_AMBULATORY_CARE_PROVIDER_SITE_OTHER): Admitting: Certified Nurse Midwife

## 2018-08-04 ENCOUNTER — Other Ambulatory Visit

## 2018-08-04 VITALS — BP 99/59 | HR 103 | Wt 159.8 lb

## 2018-08-04 DIAGNOSIS — Z3493 Encounter for supervision of normal pregnancy, unspecified, third trimester: Secondary | ICD-10-CM

## 2018-08-04 DIAGNOSIS — Z3A35 35 weeks gestation of pregnancy: Secondary | ICD-10-CM

## 2018-08-04 DIAGNOSIS — O09293 Supervision of pregnancy with other poor reproductive or obstetric history, third trimester: Secondary | ICD-10-CM | POA: Diagnosis not present

## 2018-08-04 LAB — POCT URINALYSIS DIPSTICK OB
Bilirubin, UA: NEGATIVE
Glucose, UA: NEGATIVE
Ketones, UA: NEGATIVE
LEUKOCYTES UA: NEGATIVE
NITRITE UA: NEGATIVE
PH UA: 6.5 (ref 5.0–8.0)
PROTEIN: NEGATIVE
RBC UA: NEGATIVE
Spec Grav, UA: 1.01 (ref 1.010–1.025)
Urobilinogen, UA: 0.2 E.U./dL

## 2018-08-04 NOTE — Progress Notes (Signed)
NONSTRESS TEST INTERPRETATION  INDICATIONS: History IUFD  FHR baseline: 125 Accelerations: Present Decelerations: None RESULTS:Reactive COMMENTS: Irregular   PLAN: 1. Continue fetal kick counts twice a day. 2. Continue antepartum testing as scheduled 3. Reviewed red flag symptoms and when to call 4. RTC x 1 week for [place] and ROB as scheduled or sooner if needed   Diona Fanti, CNM [facility], [facility]

## 2018-08-04 NOTE — Patient Instructions (Signed)
Nonstress Test The nonstress test is a procedure that monitors the fetus's heartbeat. The test will monitor the heartbeat when the fetus is at rest and while the fetus is moving. In a healthy fetus, there will be an increase in fetal heart rate when the fetus moves or kicks. The heart rate will decrease at rest. This test helps determine if the fetus is healthy. Your health care provider will look at a number of patterns in the heart rate tracing to make sure your baby is thriving. If there is concern, your health care provider may order additional tests or may suggest another course of action. This test is often done in the third trimester and can help determine if an early delivery is needed and safe. Common reasons to have this test are:  You are past your due date.  You have a high-risk pregnancy.  You are feeling less movement than normal.  You have lost a pregnancy in the past.  Your health care provider suspects fetal growth problems.  You have too much or too little amniotic fluid.  What happens before the procedure?  Eat a meal right before the test or as directed by your health care provider. Food may help stimulate fetal movements.  Use the restroom right before the test. What happens during the procedure?  Two belts will be placed around your abdomen. These belts have monitors attached to them. One records the fetal heart rate and the other records uterine contractions.  You may be asked to lie down on your side or to stay sitting upright.  You may be given a button to press when you feel movement.  The fetal heartbeat is listened to and watched on a screen. The heartbeat is recorded on a sheet of paper.  If the fetus seems to be sleeping, you may be asked to drink some juice or soda, gently press your abdomen, or make some noise to wake the fetus. What happens after the procedure? Your health care provider will discuss the test results with you and make recommendations  for the near future.  This information is not intended to replace advice given to you by your health care provider. Make sure you discuss any questions you have with your health care provider. This information is not intended to replace advice given to you by your health care provider. Make sure you discuss any questions you have with your health care provider. Document Released: 08/28/2002 Document Revised: 08/07/2016 Document Reviewed: 10/11/2012 Elsevier Interactive Patient Education  2018 [employer]. Fetal Movement Counts Patient Name: ________________________________________________ Patient Due Date: ____________________ What is a fetal movement count? A fetal movement count is the number of times that you feel your baby move during a certain amount of time. This may also be called a fetal kick count. A fetal movement count is recommended for every pregnant woman. You may be asked to start counting fetal movements as early as week 28 of your pregnancy. Pay attention to when your baby is most active. You may notice your baby's sleep and wake cycles. You may also notice things that make your baby move more. You should do a fetal movement count:  When your baby is normally most active.  At the same time each day.  A good time to count movements is while you are resting, after having something to eat and drink. How do I count fetal movements? 1. Find a quiet, comfortable area. Sit, or lie down on your side. 2. Write down the  date, the start time and stop time, and the number of movements that you felt between those two times. Take this information with you to your health care visits. 3. For 2 hours, count kicks, flutters, swishes, rolls, and jabs. You should feel at least 10 movements during 2 hours. 4. You may stop counting after you have felt 10 movements. 5. If you do not feel 10 movements in 2 hours, have something to eat and drink. Then, keep resting and counting for 1 hour. If you feel  at least 4 movements during that hour, you may stop counting. Contact a health care provider if:  You feel fewer than 4 movements in 2 hours.  Your baby is not moving like he or she usually does. Date: ____________ Start time: ____________ Stop time: ____________ Movements: ____________ Date: ____________ Start time: ____________ Stop time: ____________ Movements: ____________ Date: ____________ Start time: ____________ Stop time: ____________ Movements: ____________ Date: ____________ Start time: ____________ Stop time: ____________ Movements: ____________ Date: ____________ Start time: ____________ Stop time: ____________ Movements: ____________ Date: ____________ Start time: ____________ Stop time: ____________ Movements: ____________ Date: ____________ Start time: ____________ Stop time: ____________ Movements: ____________ Date: ____________ Start time: ____________ Stop time: ____________ Movements: ____________ Date: ____________ Start time: ____________ Stop time: ____________ Movements: ____________ This information is not intended to replace advice given to you by your health care provider. Make sure you discuss any questions you have with your health care provider. Document Released: 10/07/2006 Document Revised: 05/06/2016 Document Reviewed: 10/17/2015 Elsevier Interactive Patient Education  [employer].

## 2018-08-11 ENCOUNTER — Other Ambulatory Visit (INDEPENDENT_AMBULATORY_CARE_PROVIDER_SITE_OTHER)

## 2018-08-11 ENCOUNTER — Other Ambulatory Visit: Payer: Self-pay | Admitting: Obstetrics and Gynecology

## 2018-08-11 ENCOUNTER — Ambulatory Visit (INDEPENDENT_AMBULATORY_CARE_PROVIDER_SITE_OTHER): Admitting: Obstetrics and Gynecology

## 2018-08-11 ENCOUNTER — Other Ambulatory Visit

## 2018-08-11 VITALS — BP 112/66 | HR 99 | Wt 159.0 lb

## 2018-08-11 DIAGNOSIS — Z3493 Encounter for supervision of normal pregnancy, unspecified, third trimester: Secondary | ICD-10-CM

## 2018-08-11 DIAGNOSIS — Z113 Encounter for screening for infections with a predominantly sexual mode of transmission: Secondary | ICD-10-CM

## 2018-08-11 DIAGNOSIS — Z3A36 36 weeks gestation of pregnancy: Secondary | ICD-10-CM

## 2018-08-11 DIAGNOSIS — O26843 Uterine size-date discrepancy, third trimester: Secondary | ICD-10-CM

## 2018-08-11 DIAGNOSIS — Z3685 Encounter for antenatal screening for Streptococcus B: Secondary | ICD-10-CM

## 2018-08-11 DIAGNOSIS — O26893 Other specified pregnancy related conditions, third trimester: Secondary | ICD-10-CM

## 2018-08-11 DIAGNOSIS — R102 Pelvic and perineal pain: Secondary | ICD-10-CM | POA: Diagnosis not present

## 2018-08-11 DIAGNOSIS — O26849 Uterine size-date discrepancy, unspecified trimester: Secondary | ICD-10-CM

## 2018-08-11 LAB — POCT URINALYSIS DIPSTICK OB
BILIRUBIN UA: NEGATIVE
Glucose, UA: NEGATIVE
Ketones, UA: 5
NITRITE UA: NEGATIVE
RBC UA: NEGATIVE
Spec Grav, UA: 1.01 (ref 1.010–1.025)
UROBILINOGEN UA: 0.2 U/dL
pH, UA: 6 (ref 5.0–8.0)

## 2018-08-11 NOTE — Progress Notes (Signed)
ROB with BPP & NST- reports increased pressure but irregular contractions, still has bouts of itching.  NST performed today was reviewed and was found to be reactive.  Baseline 121 with moderate variability, no decels noted. AFI normal at 13 cm.   Continue recommended antenatal testing and prenatal care.cultures obtained.   Ultrasound findings:  Indications: Biophysical Profile performed for indication of non-reactive non-stress test  Findings:  Singleton intrauterine pregnancy is visualized with FHR at 140 BPM.    Fetal presentation is Cephalic.  Placenta: posterior. Grade: 0 AFI: 13.2 cm  BPP Scoring: Movement: 2/2  Tone: 2/2  Breathing: 2/2  AFI: 2/2 NST: 0/2  Impression: 1. [redacted]w[redacted]d Viable Singleton Intrauterine pregnancy dated by previously established criteria. 2. AFI is 13.2 cm.  3. BPP is 8/10

## 2018-08-11 NOTE — Progress Notes (Signed)
ROB NST 

## 2018-08-13 LAB — GC/CHLAMYDIA PROBE AMP
CHLAMYDIA, DNA PROBE: NEGATIVE
NEISSERIA GONORRHOEAE BY PCR: NEGATIVE

## 2018-08-13 LAB — STREP GP B NAA: Strep Gp B NAA: NEGATIVE

## 2018-08-17 ENCOUNTER — Other Ambulatory Visit

## 2018-08-17 ENCOUNTER — Ambulatory Visit (INDEPENDENT_AMBULATORY_CARE_PROVIDER_SITE_OTHER): Admitting: Certified Nurse Midwife

## 2018-08-17 DIAGNOSIS — Z3483 Encounter for supervision of other normal pregnancy, third trimester: Secondary | ICD-10-CM

## 2018-08-17 NOTE — Patient Instructions (Signed)
Braxton Hicks Contractions °Contractions of the uterus can occur throughout pregnancy, but they are not always a sign that you are in labor. You may have practice contractions called Braxton Hicks contractions. These false labor contractions are sometimes confused with true labor. °What are Braxton Hicks contractions? °Braxton Hicks contractions are tightening movements that occur in the muscles of the uterus before labor. Unlike true labor contractions, these contractions do not result in opening (dilation) and thinning of the cervix. Toward the end of pregnancy (32-34 weeks), Braxton Hicks contractions can happen more often and may become stronger. These contractions are sometimes difficult to tell apart from true labor because they can be very uncomfortable. You should not feel embarrassed if you go to the hospital with false labor. °Sometimes, the only way to tell if you are in true labor is for your health care provider to look for changes in the cervix. The health care provider will do a physical exam and may monitor your contractions. If you are not in true labor, the exam should show that your cervix is not dilating and your water has not broken. °If there are other health problems associated with your pregnancy, it is completely safe for you to be sent home with false labor. You may continue to have Braxton Hicks contractions until you go into true labor. °How to tell the difference between true labor and false labor °True labor °· Contractions last 30-70 seconds. °· Contractions become very regular. °· Discomfort is usually felt in the top of the uterus, and it spreads to the lower abdomen and low back. °· Contractions do not go away with walking. °· Contractions usually become more intense and increase in frequency. °· The cervix dilates and gets thinner. °False labor °· Contractions are usually shorter and not as strong as true labor contractions. °· Contractions are usually irregular. °· Contractions  are often felt in the front of the lower abdomen and in the groin. °· Contractions may go away when you walk around or change positions while lying down. °· Contractions get weaker and are shorter-lasting as time goes on. °· The cervix usually does not dilate or become thin. °Follow these instructions at home: °· Take over-the-counter and prescription medicines only as told by your health care provider. °· Keep up with your usual exercises and follow other instructions from your health care provider. °· Eat and drink lightly if you think you are going into labor. °· If Braxton Hicks contractions are making you uncomfortable: °? Change your position from lying down or resting to walking, or change from walking to resting. °? Sit and rest in a tub of warm water. °? Drink enough fluid to keep your urine pale yellow. Dehydration may cause these contractions. °? Do slow and deep breathing several times an hour. °· Keep all follow-up prenatal visits as told by your health care provider. This is important. °Contact a health care provider if: °· You have a fever. °· You have continuous pain in your abdomen. °Get help right away if: °· Your contractions become stronger, more regular, and closer together. °· You have fluid leaking or gushing from your vagina. °· You pass blood-tinged mucus (bloody show). °· You have bleeding from your vagina. °· You have low back pain that you never had before. °· You feel your baby’s head pushing down and causing pelvic pressure. °· Your baby is not moving inside you as much as it used to. °Summary °· Contractions that occur before labor are called Braxton   Hicks contractions, false labor, or practice contractions. °· Braxton Hicks contractions are usually shorter, weaker, farther apart, and less regular than true labor contractions. True labor contractions usually become progressively stronger and regular and they become more frequent. °· Manage discomfort from Braxton Hicks contractions by  changing position, resting in a warm bath, drinking plenty of water, or practicing deep breathing. °This information is not intended to replace advice given to you by your health care provider. Make sure you discuss any questions you have with your health care provider. °Document Released: 01/21/2017 Document Revised: 01/21/2017 Document Reviewed: 01/21/2017 °Elsevier Interactive Patient Education © 2018 Elsevier Inc. ° °

## 2018-08-17 NOTE — Progress Notes (Signed)
ROB doing well. NST today reactive. Baseline 130, accelerations present. Decels absent. Moderate variability . Irritability noted on toco. SVE per pt request. 1/50/-3 Retrun in one week for growth/ BPP and NST and ROB with Melody.   Philip Aspen, CNM

## 2018-08-23 ENCOUNTER — Ambulatory Visit (INDEPENDENT_AMBULATORY_CARE_PROVIDER_SITE_OTHER): Admitting: Certified Nurse Midwife

## 2018-08-23 ENCOUNTER — Encounter: Payer: Self-pay | Admitting: Obstetrics and Gynecology

## 2018-08-23 ENCOUNTER — Ambulatory Visit (INDEPENDENT_AMBULATORY_CARE_PROVIDER_SITE_OTHER)

## 2018-08-23 ENCOUNTER — Other Ambulatory Visit

## 2018-08-23 VITALS — BP 117/64 | HR 109 | Wt 161.0 lb

## 2018-08-23 DIAGNOSIS — O09293 Supervision of pregnancy with other poor reproductive or obstetric history, third trimester: Secondary | ICD-10-CM | POA: Diagnosis not present

## 2018-08-23 DIAGNOSIS — Z3483 Encounter for supervision of other normal pregnancy, third trimester: Secondary | ICD-10-CM

## 2018-08-23 DIAGNOSIS — Z3493 Encounter for supervision of normal pregnancy, unspecified, third trimester: Secondary | ICD-10-CM

## 2018-08-23 DIAGNOSIS — Z8759 Personal history of other complications of pregnancy, childbirth and the puerperium: Secondary | ICD-10-CM

## 2018-08-23 DIAGNOSIS — O26833 Pregnancy related renal disease, third trimester: Secondary | ICD-10-CM

## 2018-08-23 DIAGNOSIS — Z3A37 37 weeks gestation of pregnancy: Secondary | ICD-10-CM | POA: Diagnosis not present

## 2018-08-23 DIAGNOSIS — N200 Calculus of kidney: Secondary | ICD-10-CM

## 2018-08-23 NOTE — Patient Instructions (Signed)
Labor Induction Labor induction is when steps are taken to cause a pregnant woman to begin the labor process. Most women go into labor on their own between 37 weeks and 42 weeks of the pregnancy. When this does not happen or when there is a medical need, methods may be used to induce labor. Labor induction causes a pregnant woman's uterus to contract. It also causes the cervix to soften (ripen), open (dilate), and thin out (efface). Usually, labor is not induced before 39 weeks of the pregnancy unless there is a problem with the baby or mother. Before inducing labor, your health care provider will consider a number of factors, including the following:  The medical condition of you and the baby.  How many weeks along you are.  The status of the baby's lung maturity.  The condition of the cervix.  The position of the baby.  What are the reasons for labor induction? Labor may be induced for the following reasons:  The health of the baby or mother is at risk.  The pregnancy is overdue by 1 week or more.  The water breaks but labor does not start on its own.  The mother has a health condition or serious illness, such as high blood pressure, infection, placental abruption, or diabetes.  The amniotic fluid amounts are low around the baby.  The baby is distressed.  Convenience or wanting the baby to be born on a certain date is not a reason for inducing labor. What methods are used for labor induction? Several methods of labor induction may be used, such as:  Prostaglandin medicine. This medicine causes the cervix to dilate and ripen. The medicine will also start contractions. It can be taken by mouth or by inserting a suppository into the vagina.  Inserting a thin tube (catheter) with a balloon on the end into the vagina to dilate the cervix. Once inserted, the balloon is expanded with water, which causes the cervix to open.  Stripping the membranes. Your health care provider separates  amniotic sac tissue from the cervix, causing the cervix to be stretched and causing the release of a hormone called progesterone. This may cause the uterus to contract. It is often done during an office visit. You will be sent home to wait for the contractions to begin. You will then come in for an induction.  Breaking the water. Your health care provider makes a hole in the amniotic sac using a small instrument. Once the amniotic sac breaks, contractions should begin. This may still take hours to see an effect.  Medicine to trigger or strengthen contractions. This medicine is given through an IV access tube inserted into a vein in your arm.  All of the methods of induction, besides stripping the membranes, will be done in the hospital. Induction is done in the hospital so that you and the baby can be carefully monitored. How long does it take for labor to be induced? Some inductions can take up to 2-3 days. Depending on the cervix, it usually takes less time. It takes longer when you are induced early in the pregnancy or if this is your first pregnancy. If a mother is still pregnant and the induction has been going on for 2-3 days, either the mother will be sent home or a cesarean delivery will be needed. What are the risks associated with labor induction? Some of the risks of induction include:  Changes in fetal heart rate, such as too high, too low, or erratic.  Fetal distress.  Chance of infection for the mother and baby.  Increased chance of having a cesarean delivery.  Breaking off (abruption) of the placenta from the uterus (rare).  Uterine rupture (very rare).  When induction is needed for medical reasons, the benefits of induction may outweigh the risks. What are some reasons for not inducing labor? Labor induction should not be done if:  It is shown that your baby does not tolerate labor.  You have had previous surgeries on your uterus, such as a myomectomy or the removal of  fibroids.  Your placenta lies very low in the uterus and blocks the opening of the cervix (placenta previa).  Your baby is not in a head-down position.  The umbilical cord drops down into the birth canal in front of the baby. This could cut off the baby's blood and oxygen supply.  You have had a previous cesarean delivery.  There are unusual circumstances, such as the baby being extremely premature.  This information is not intended to replace advice given to you by your health care provider. Make sure you discuss any questions you have with your health care provider. Document Released: 01/27/2007 Document Revised: 02/13/2016 Document Reviewed: 04/06/2013 Elsevier Interactive Patient Education  2017 [place]. Fetal Movement Counts Patient Name: ________________________________________________ Patient Due Date: ____________________ What is a fetal movement count? A fetal movement count is the number of times that you feel your baby move during a certain amount of time. This may also be called a fetal kick count. A fetal movement count is recommended for every pregnant woman. You may be asked to start counting fetal movements as early as week 28 of your pregnancy. Pay attention to when your baby is most active. You may notice your baby's sleep and wake cycles. You may also notice things that make your baby move more. You should do a fetal movement count:  When your baby is normally most active.  At the same time each day.  A good time to count movements is while you are resting, after having something to eat and drink. How do I count fetal movements? 1. Find a quiet, comfortable area. Sit, or lie down on your side. 2. Write down the date, the start time and stop time, and the number of movements that you felt between those two times. Take this information with you to your health care visits. 3. For 2 hours, count kicks, flutters, swishes, rolls, and jabs. You should feel at least 10  movements during 2 hours. 4. You may stop counting after you have felt 10 movements. 5. If you do not feel 10 movements in 2 hours, have something to eat and drink. Then, keep resting and counting for 1 hour. If you feel at least 4 movements during that hour, you may stop counting. Contact a health care provider if:  You feel fewer than 4 movements in 2 hours.  Your baby is not moving like he or she usually does. Date: ____________ Start time: ____________ Stop time: ____________ Movements: ____________ Date: ____________ Start time: ____________ Stop time: ____________ Movements: ____________ Date: ____________ Start time: ____________ Stop time: ____________ Movements: ____________ Date: ____________ Start time: ____________ Stop time: ____________ Movements: ____________ Date: ____________ Start time: ____________ Stop time: ____________ Movements: ____________ Date: ____________ Start time: ____________ Stop time: ____________ Movements: ____________ Date: ____________ Start time: ____________ Stop time: ____________ Movements: ____________ Date: ____________ Start time: ____________ Stop time: ____________ Movements: ____________ Date: ____________ Start time: ____________ Stop time: ____________ Movements: ____________  This information is not intended to replace advice given to you by your health care provider. Make sure you discuss any questions you have with your health care provider. Document Released: 10/07/2006 Document Revised: 05/06/2016 Document Reviewed: 10/17/2015 Elsevier Interactive Patient Education  [employer].

## 2018-08-23 NOTE — Progress Notes (Signed)
ROB-NST, Growth [place], and BPP today; all within normal limits. Reports pelvic pressure and request SVE. Herbal prep handout given. Induction of labor scheduled 08/31/2018 at 0500. Reviewed red flag symptoms and when to call. RTC x 7 weeks for PPV or sooner if needed.   ULTRASOUND REPORT  Location:[facility] Date of Service: 08/23/2018   Indications:growth/afi and BPP Findings:  Stephanie Logan intrauterine pregnancy is visualized with FHR at 139 BPM. Biometrics give an (U/S) Gestational age of [redacted]w[redacted]d and an (U/S) EDD of 09/15/18; this correlates with the clinically established Estimated Date of Delivery: 09/07/18.  Fetal presentation is Cephalic.  Placenta: posterior. Grade: 0 AFI: 7 cm but the MVP = 11.5cm  Growth percentile is 22%. EFW: 2809g    BPP Scoring: Movement: 2/2  Tone: 2/2  Breathing: 2/2  AFI: 2/2  Impression: 1. [redacted]w[redacted]d Viable Singleton Intrauterine pregnancy dated by previously established criteria. 2. AFI is 7 cm. But the MVP = 11.5cm 3. BPP is 8/8 4. 2. Growth is 22 %ile.  Recommendations: 1.Clinical correlation with the patient's History and Physical Exam.

## 2018-08-23 NOTE — Progress Notes (Signed)
NONSTRESS TEST INTERPRETATION  INDICATIONS: History of IUFD  FHR baseline: 120-125 bpm RESULTS:Reactive COMMENTS: Irregular contractions   PLAN: 1. Continue fetal kick counts twice a day. 2. Reviewed red flag symptoms and when to call.  3. Advised induction of labor scheduled 08/31/2018 at 0500, reports to [facility].   Garner Nash, [place]

## 2018-08-25 ENCOUNTER — Other Ambulatory Visit: Payer: Self-pay | Admitting: Certified Nurse Midwife

## 2018-08-31 ENCOUNTER — Inpatient Hospital Stay: Admitting: Anesthesiology

## 2018-08-31 ENCOUNTER — Other Ambulatory Visit: Payer: Self-pay

## 2018-08-31 ENCOUNTER — Inpatient Hospital Stay
Admission: EM | Admit: 2018-08-31 | Discharge: 2018-09-02 | DRG: 807 | Disposition: A | Discharge status: Home / Self Care | Attending: Obstetrics and Gynecology | Admitting: Obstetrics and Gynecology

## 2018-08-31 DIAGNOSIS — Z3A39 39 weeks gestation of pregnancy: Secondary | ICD-10-CM

## 2018-08-31 DIAGNOSIS — Z3483 Encounter for supervision of other normal pregnancy, third trimester: Secondary | ICD-10-CM | POA: Diagnosis present

## 2018-08-31 DIAGNOSIS — E538 Deficiency of other specified B group vitamins: Secondary | ICD-10-CM

## 2018-08-31 DIAGNOSIS — Z3490 Encounter for supervision of normal pregnancy, unspecified, unspecified trimester: Secondary | ICD-10-CM | POA: Diagnosis present

## 2018-08-31 LAB — TYPE AND SCREEN
ABO/RH(D): O POS
Antibody Screen: NEGATIVE
DAT, IgG: NEGATIVE
Weak D: POSITIVE

## 2018-08-31 LAB — CBC
HCT: 34.9 % — ABNORMAL LOW (ref 36.0–46.0)
Hemoglobin: 10.3 g/dL — ABNORMAL LOW (ref 12.0–15.0)
MCH: 22.2 pg — ABNORMAL LOW (ref 26.0–34.0)
MCHC: 29.5 g/dL — ABNORMAL LOW (ref 30.0–36.0)
MCV: 75.4 fL — ABNORMAL LOW (ref 80.0–100.0)
Platelets: 359 10*3/uL (ref 150–400)
RBC: 4.63 MIL/uL (ref 3.87–5.11)
RDW: 15.7 % — ABNORMAL HIGH (ref 11.5–15.5)
WBC: 11.2 10*3/uL — ABNORMAL HIGH (ref 4.0–10.5)
nRBC: 0 % (ref 0.0–0.2)

## 2018-08-31 MED ORDER — OXYTOCIN 40 UNITS IN LACTATED RINGERS INFUSION - SIMPLE MED: 1.0000 m[IU]/min | INTRAVENOUS | Status: DC

## 2018-08-31 MED ORDER — AMMONIA AROMATIC IN INHA
RESPIRATORY_TRACT | Status: AC
  Filled 2018-08-31: qty 10

## 2018-08-31 MED ORDER — PHENYLEPHRINE 40 MCG/ML (10ML) SYRINGE FOR IV PUSH (FOR BLOOD PRESSURE SUPPORT)
80.0000 ug | PREFILLED_SYRINGE | INTRAVENOUS | Status: DC | PRN
  Filled 2018-08-31: qty 10

## 2018-08-31 MED ORDER — PHENYLEPHRINE 40 MCG/ML (10ML) SYRINGE FOR IV PUSH (FOR BLOOD PRESSURE SUPPORT): 100.0000 ug | PREFILLED_SYRINGE | Freq: Once | INTRAVENOUS | Status: DC

## 2018-08-31 MED ORDER — LACTATED RINGERS IV SOLN: 500.0000 mL | Freq: Once | INTRAVENOUS | Status: DC

## 2018-08-31 MED ORDER — LIDOCAINE HCL (PF) 1 % IJ SOLN
INTRAMUSCULAR | Status: DC | PRN
  Administered 2018-08-31: 3 mL

## 2018-08-31 MED ORDER — IBUPROFEN 600 MG PO TABS
ORAL_TABLET | ORAL | Status: AC
  Filled 2018-08-31: qty 1

## 2018-08-31 MED ORDER — EPHEDRINE 5 MG/ML INJ
10.0000 mg | INTRAVENOUS | Status: DC | PRN
  Filled 2018-08-31: qty 2

## 2018-08-31 MED ORDER — LIDOCAINE-EPINEPHRINE (PF) 1.5 %-1:200000 IJ SOLN
INTRAMUSCULAR | Status: DC | PRN
  Administered 2018-08-31: 3 mL via PERINEURAL

## 2018-08-31 MED ORDER — LIDOCAINE HCL (PF) 2 % IJ SOLN
INTRAMUSCULAR | Status: DC | PRN
  Administered 2018-08-31: 5 mL via INTRADERMAL

## 2018-08-31 MED ORDER — BUPIVACAINE HCL (PF) 0.25 % IJ SOLN
INTRAMUSCULAR | Status: DC | PRN
  Administered 2018-08-31: 8 mL via EPIDURAL

## 2018-08-31 MED ORDER — LIDOCAINE HCL (PF) 1 % IJ SOLN
INTRAMUSCULAR | Status: AC
  Filled 2018-08-31: qty 30

## 2018-08-31 MED ORDER — MISOPROSTOL 25 MCG QUARTER TABLET
25.0000 ug | ORAL_TABLET | ORAL | Status: DC | PRN
  Administered 2018-08-31 (×2): 25 ug via VAGINAL
  Filled 2018-08-31 (×2): qty 1

## 2018-08-31 MED ORDER — BUTORPHANOL TARTRATE 1 MG/ML IJ SOLN
1.0000 mg | INTRAMUSCULAR | Status: DC | PRN
  Administered 2018-08-31: 1 mg via INTRAVENOUS
  Filled 2018-08-31: qty 1

## 2018-08-31 MED ORDER — FENTANYL 2.5 MCG/ML W/ROPIVACAINE 0.15% IN NS 100 ML EPIDURAL (ARMC)
EPIDURAL | Status: AC
  Filled 2018-08-31: qty 100

## 2018-08-31 MED ORDER — OXYTOCIN 10 UNIT/ML IJ SOLN
INTRAMUSCULAR | Status: AC
  Filled 2018-08-31: qty 2

## 2018-08-31 MED ORDER — OXYCODONE-ACETAMINOPHEN 5-325 MG PO TABS: 1.0000 | ORAL_TABLET | ORAL | Status: DC | PRN

## 2018-08-31 MED ORDER — DIPHENHYDRAMINE HCL 50 MG/ML IJ SOLN: 12.5000 mg | INTRAMUSCULAR | Status: DC | PRN

## 2018-08-31 MED ORDER — LIDOCAINE HCL (PF) 1 % IJ SOLN: 30.0000 mL | INTRAMUSCULAR | Status: DC | PRN

## 2018-08-31 MED ORDER — LACTATED RINGERS IV SOLN: 500.0000 mL | INTRAVENOUS | Status: DC | PRN

## 2018-08-31 MED ORDER — LACTATED RINGERS IV SOLN
INTRAVENOUS | Status: DC
  Administered 2018-08-31 (×3): via INTRAVENOUS

## 2018-08-31 MED ORDER — ACETAMINOPHEN 325 MG PO TABS
650.0000 mg | ORAL_TABLET | ORAL | Status: DC | PRN
  Administered 2018-09-01: 650 mg via ORAL
  Filled 2018-08-31: qty 2

## 2018-08-31 MED ORDER — TERBUTALINE SULFATE 1 MG/ML IJ SOLN: 0.2500 mg | Freq: Once | INTRAMUSCULAR | Status: DC | PRN

## 2018-08-31 MED ORDER — MISOPROSTOL 200 MCG PO TABS
ORAL_TABLET | ORAL | Status: AC
  Filled 2018-08-31: qty 4

## 2018-08-31 MED ORDER — FENTANYL 2.5 MCG/ML W/ROPIVACAINE 0.15% IN NS 100 ML EPIDURAL (ARMC): 12.0000 mL/h | EPIDURAL | Status: DC

## 2018-08-31 MED ORDER — ONDANSETRON HCL 4 MG/2ML IJ SOLN: 4.0000 mg | Freq: Four times a day (QID) | INTRAMUSCULAR | Status: DC | PRN

## 2018-08-31 MED ORDER — OXYCODONE-ACETAMINOPHEN 5-325 MG PO TABS: 2.0000 | ORAL_TABLET | ORAL | Status: DC | PRN

## 2018-08-31 MED ORDER — FENTANYL 2.5 MCG/ML W/ROPIVACAINE 0.15% IN NS 100 ML EPIDURAL (ARMC)
EPIDURAL | Status: DC | PRN
  Administered 2018-08-31: 12 mL/h via EPIDURAL

## 2018-08-31 MED ORDER — OXYTOCIN 40 UNITS IN LACTATED RINGERS INFUSION - SIMPLE MED
2.5000 [IU]/h | INTRAVENOUS | Status: DC
  Filled 2018-08-31: qty 1000

## 2018-08-31 MED ORDER — IBUPROFEN 600 MG PO TABS
600.0000 mg | ORAL_TABLET | Freq: Four times a day (QID) | ORAL | Status: DC
  Administered 2018-08-31 – 2018-09-01 (×2): 600 mg via ORAL
  Filled 2018-08-31: qty 1

## 2018-08-31 MED ORDER — SOD CITRATE-CITRIC ACID 500-334 MG/5ML PO SOLN: 30.0000 mL | ORAL | Status: DC | PRN

## 2018-08-31 MED ORDER — OXYTOCIN BOLUS FROM INFUSION: 500.0000 mL | Freq: Once | INTRAVENOUS | Status: DC

## 2018-08-31 MED ORDER — ZOLPIDEM TARTRATE 5 MG PO TABS: 5.0000 mg | ORAL_TABLET | Freq: Every evening | ORAL | Status: DC | PRN

## 2018-08-31 NOTE — Progress Notes (Signed)
Stephanie Logan is a 27 y.o. [id number] at [redacted]w[redacted]d by LMP admitted for induction of labor due to Elective at term.  Subjective: Reports increased pressure and pain with contractions over last hour. Request epidural.  Objective: BP (!) 110/57 (BP Location: Left Arm)   Pulse 91   Temp 99 F (37.2 C) (Axillary)   Resp 18   Ht 5\' 2"  (1.575 m)   Wt 73 kg   LMP 08/20/2017   BMI 29.45 kg/m  No intake/output data recorded. No intake/output data recorded.  FHT:  FHR: 125 bpm, variability: moderate,  accelerations:  Present,  decelerations:  Absent UC:   regular, every 2 minutes, moderate to palpation SVE:   Dilation: 5 Effacement (%): 70, 80 Station: -1, -2 Exam by:: Felisa Bonier CNM  Labs: Lab Results  Component Value Date   WBC 11.2 (H) 08/31/2018   HGB 10.3 (L) 08/31/2018   HCT 34.9 (L) 08/31/2018   MCV 75.4 (L) 08/31/2018   PLT 359 08/31/2018    Assessment / Plan: active labor  Labor: Progressing normally Preeclampsia:  labs stable Fetal Wellbeing:  Category I Pain Control:  IV pain meds I/D:  n/a Anticipated MOD:  NSVD  Stephanie Logan 08/31/2018, 6:25 PM

## 2018-08-31 NOTE — Anesthesia Preprocedure Evaluation (Signed)
Anesthesia Evaluation  Patient identified by MRN, date of birth, ID band Patient awake    Reviewed: Allergy & Precautions, NPO status , Patient's Chart, lab work & pertinent test results  Airway Mallampati: II  TM Distance: >3 FB     Dental no notable dental hx.    Pulmonary neg pulmonary ROS,    Pulmonary exam normal        Cardiovascular negative cardio ROS Normal cardiovascular exam     Neuro/Psych PSYCHIATRIC DISORDERS Anxiety Depression negative neurological ROS     GI/Hepatic negative GI ROS, Neg liver ROS,   Endo/Other  negative endocrine ROS  Renal/GU stones  negative genitourinary   Musculoskeletal negative musculoskeletal ROS (+)   Abdominal Normal abdominal exam  (+)   Peds negative pediatric ROS (+)  Hematology negative hematology ROS (+)   Anesthesia Other Findings   Reproductive/Obstetrics (+) Pregnancy                             Anesthesia Physical Anesthesia Plan  ASA: II  Anesthesia Plan: Epidural   Post-op Pain Management:    Induction:   PONV Risk Score and Plan:   Airway Management Planned: Natural Airway  Additional Equipment:   Intra-op Plan:   Post-operative Plan:   Informed Consent: I have reviewed the patients History and Physical, chart, labs and discussed the procedure including the risks, benefits and alternatives for the proposed anesthesia with the patient or authorized representative who has indicated his/her understanding and acceptance.   Dental advisory given  Plan Discussed with: CRNA and Surgeon  Anesthesia Plan Comments:         Anesthesia Quick Evaluation

## 2018-08-31 NOTE — Anesthesia Procedure Notes (Signed)
Epidural Patient location during procedure: OB Start time: 08/31/2018 6:47 PM End time: 08/31/2018 6:56 PM  Staffing Anesthesiologist: Alvin Critchley, MD  Preanesthetic Checklist Completed: patient identified, site marked, surgical consent, pre-op evaluation, timeout performed, IV checked, risks and benefits discussed and monitors and equipment checked  Epidural Patient position: sitting Prep: Betadine Patient monitoring: heart rate, continuous pulse ox and blood pressure Approach: midline Location: L3-L4 Injection technique: LOR air  Needle:  Needle type: Tuohy  Needle gauge: 17 G Needle length: 9 cm and 9 Catheter type: closed end flexible Catheter size: 19 Gauge Test dose: negative and 1.5% lidocaine with Epi 1:200 K  Assessment Sensory level: T8 Events: blood not aspirated, injection not painful, no injection resistance, negative IV test and no paresthesia  Additional Notes Time out called.  Patient placed in sitting position.  Back prepped and draped in sterile fashion.  A skin wheal was made in the L3-L4 interspace with 1% Lidocaine plain.  A 17G Tuohy needle was advanced into the epidural space by a loss of resistance technique.  The epidural catheter was threaded 3 cm into the space and the TD was negative.  No blood or paresthesias.  The patient tolerated the procedure well and the catheter was affixed to the back in sterile fashion.Reason for block:procedure for pain

## 2018-08-31 NOTE — H&P (Signed)
Obstetric History and Physical  BAYLEY YARBOROUGH is a 27 y.o. [phone] with IUP at [redacted]w[redacted]d presenting with orders for IOL. Patient states she has been having  irregular, every 6-12 minutes contractions mild to palpation, none vaginal bleeding, intact membranes, with active fetal movement.    Prenatal Course Source of Care: [facility]  Pregnancy complications or risks:previous fetal demise at 57 weeks due to [place] and true knot in cord, anxiety, depression,  Prenatal labs and studies: ABO, Rh: O/Positive/-- (05/10 1425) Antibody: Negative (05/10 1425) Rubella: 2.53 (05/10 1425) RPR: Non Reactive (09/27 0943)  HBsAg: Negative (05/10 1425)  HIV: Non Reactive (05/10 1425)  TDD:UKGURKYH (11/21 1522) 1 hr Glucola  normal Genetic screening see records Anatomy US normal  Past Medical History:  Diagnosis Date  . Amenorrhea   . Anxiety   . PCOS (polycystic ovarian syndrome)   . Post partum depression     Past Surgical History:  Procedure Laterality Date  . BREAST FIBROADENOMA SURGERY  2008,2010    OB History  Gravida Para Term Preterm AB Living  5 1 0 1 1 2   SAB TAB Ectopic Multiple Live Births  1 0 0 0 2    # Outcome Date GA Lbr Len/2nd Weight Sex Delivery Anes PTL Lv  5 Current           4 Preterm 02/07/17 [redacted]w[redacted]d  2722 g F  EPI  FD  3 Gravida 01/27/15    M Vag-Spont   LIV  2 SAB 01/2014        ND  1 Gravida 01/19/13    M Vag-Spont   LIV     Complications: Anal sphincter tear in delivery without 3rd degree perineal laceration    Social History   Socioeconomic History  . Marital status: Married    Spouse name: Donna Christen  . Number of children: 2  . Years of education: Not on file  . Highest education level: Not on file  Occupational History  . Not on file  Social Needs  . Financial resource strain: Not hard at all  . Food insecurity:    Worry: Patient refused    Inability: Patient refused  . Transportation needs:    Medical: Patient refused    Non-medical: Patient refused   Tobacco Use  . Smoking status: Never Smoker  . Smokeless tobacco: Never Used  Substance and Sexual Activity  . Alcohol use: Not Currently    Comment: occas  . Drug use: No  . Sexual activity: Yes    Birth control/protection: Condom    Comment: undecided  Lifestyle  . Physical activity:    Days per week: 3 days    Minutes per session: Not on file  . Stress: Very much  Relationships  . Social connections:    Talks on phone: Not on file    Gets together: Not on file    Attends religious service: Not on file    Active member of club or organization: Not on file    Attends meetings of clubs or organizations: Not on file    Relationship status: Not on file  Other Topics Concern  . Not on file  Social History Narrative  . Not on file    Family History  Problem Relation Age of Onset  . Cancer Maternal Grandmother        brain  . Cancer Mother        cervical, precancerous  . Seizures Sister     Medications Prior to Admission  Medication Sig Dispense Refill Last Dose  . Prenatal Vit-Fe Fumarate-FA (PRENATAL MULTIVITAMIN) TABS tablet Take 1 tablet by mouth daily at 12 noon.   08/30/2018 at Unknown time  . sertraline (ZOLOFT) 100 MG tablet Take 1 tablet (100 mg total) by mouth daily. 90 tablet 2 08/30/2018 at Unknown time  . hydrOXYzine (ATARAX/VISTARIL) 25 MG tablet Take 1 tablet (25 mg total) by mouth every 6 (six) hours as needed (contractions). 30 tablet 2 Taking  . thiamine (VITAMIN B-1) 100 MG tablet Take 100 mg by mouth daily.   Taking    Allergies  Allergen Reactions  . Ciprofloxacin Rash    Review of Systems: Negative except for what is mentioned in HPI.  Physical Exam: BP (!) 90/55 (BP Location: Right Arm)   Pulse 90   Temp 98.6 F (37 C) (Oral)   Resp 18   Ht 5\' 2"  (1.575 m)   Wt 73 kg   LMP 08/20/2017   BMI 29.45 kg/m  GENERAL: Well-developed, well-nourished female in no acute distress.  LUNGS: Clear to auscultation bilaterally.  HEART: Regular  rate and rhythm. ABDOMEN: Soft, nontender, nondistended, gravid. EXTREMITIES: Nontender, no edema, 2+ distal pulses. Cervical Exam: Dilation: 1 Effacement (%): Thick Station: -3 Exam by:: Willia Craze, RN  FHT:  Baseline rate 121 bpm   Variability moderate  Accelerations present   Decelerations none Contractions: Every 3-6 mins   Pertinent Labs/Studies:   Results for orders placed or performed during the hospital encounter of 08/31/18 (from the past 24 hour(s))  CBC     Status: Abnormal   Collection Time: 08/31/18  6:18 AM  Result Value Ref Range   WBC 11.2 (H) 4.0 - 10.5 K/uL   RBC 4.63 3.87 - 5.11 MIL/uL   Hemoglobin 10.3 (L) 12.0 - 15.0 g/dL   HCT 34.9 (L) 36.0 - 46.0 %   MCV 75.4 (L) 80.0 - 100.0 fL   MCH 22.2 (L) 26.0 - 34.0 pg   MCHC 29.5 (L) 30.0 - 36.0 g/dL   RDW 15.7 (H) 11.5 - 15.5 %   Platelets 359 150 - 400 K/uL   nRBC 0.0 0.0 - 0.2 %    Assessment : SHELSY SENG is a 27 y.o. [id number] at [redacted]w[redacted]d being admitted for labor.  Plan: Labor: Expectant management.  Induction with cytotec per protocol; clear liquid diet. FWB: Reassuring fetal heart tracing.  GBS negative Delivery plan: Hopeful for vaginal delivery  Maloni Musleh, CNM [facility]

## 2018-09-01 LAB — CBC
HEMATOCRIT: 31.3 % — AB (ref 36.0–46.0)
Hemoglobin: 9.4 g/dL — ABNORMAL LOW (ref 12.0–15.0)
MCH: 22.7 pg — ABNORMAL LOW (ref 26.0–34.0)
MCHC: 30 g/dL (ref 30.0–36.0)
MCV: 75.4 fL — ABNORMAL LOW (ref 80.0–100.0)
Platelets: 293 10*3/uL (ref 150–400)
RBC: 4.15 MIL/uL (ref 3.87–5.11)
RDW: 15.5 % (ref 11.5–15.5)
WBC: 13.6 10*3/uL — ABNORMAL HIGH (ref 4.0–10.5)
nRBC: 0 % (ref 0.0–0.2)

## 2018-09-01 LAB — RPR: RPR Ser Ql: NONREACTIVE

## 2018-09-01 MED ORDER — SERTRALINE HCL 100 MG PO TABS
100.0000 mg | ORAL_TABLET | Freq: Every day | ORAL | Status: DC
  Administered 2018-09-01: 100 mg via ORAL

## 2018-09-01 MED ORDER — DIBUCAINE 1 % RE OINT: 1.0000 "application " | TOPICAL_OINTMENT | RECTAL | Status: DC | PRN

## 2018-09-01 MED ORDER — ONDANSETRON HCL 4 MG/2ML IJ SOLN: 4.0000 mg | INTRAMUSCULAR | Status: DC | PRN

## 2018-09-01 MED ORDER — WITCH HAZEL-GLYCERIN EX PADS: 1.0000 "application " | MEDICATED_PAD | CUTANEOUS | Status: DC | PRN

## 2018-09-01 MED ORDER — DOCUSATE SODIUM 100 MG PO CAPS
100.0000 mg | ORAL_CAPSULE | Freq: Two times a day (BID) | ORAL | Status: DC
  Administered 2018-09-01 – 2018-09-02 (×3): 100 mg via ORAL
  Filled 2018-09-01 (×3): qty 1

## 2018-09-01 MED ORDER — PRENATAL MULTIVITAMIN CH
1.0000 | ORAL_TABLET | Freq: Every day | ORAL | Status: DC
  Administered 2018-09-01 – 2018-09-02 (×2): 1 via ORAL
  Filled 2018-09-01 (×2): qty 1

## 2018-09-01 MED ORDER — ACETAMINOPHEN 325 MG PO TABS
650.0000 mg | ORAL_TABLET | ORAL | Status: DC | PRN
  Administered 2018-09-01: 650 mg via ORAL
  Filled 2018-09-01: qty 2

## 2018-09-01 MED ORDER — SERTRALINE HCL 100 MG PO TABS
100.0000 mg | ORAL_TABLET | Freq: Every day | ORAL | Status: DC
  Administered 2018-09-01: 100 mg via ORAL
  Filled 2018-09-01: qty 1

## 2018-09-01 MED ORDER — SIMETHICONE 80 MG PO CHEW: 80.0000 mg | CHEWABLE_TABLET | ORAL | Status: DC | PRN

## 2018-09-01 MED ORDER — COCONUT OIL OIL: 1.0000 "application " | TOPICAL_OIL | Status: DC | PRN

## 2018-09-01 MED ORDER — BENZOCAINE-MENTHOL 20-0.5 % EX AERO
INHALATION_SPRAY | CUTANEOUS | Status: AC
  Filled 2018-09-01: qty 56

## 2018-09-01 MED ORDER — IBUPROFEN 600 MG PO TABS: 600.0000 mg | ORAL_TABLET | Freq: Four times a day (QID) | ORAL | Status: DC

## 2018-09-01 MED ORDER — SERTRALINE HCL 100 MG PO TABS
ORAL_TABLET | ORAL | Status: AC
  Filled 2018-09-01: qty 1

## 2018-09-01 MED ORDER — OXYCODONE-ACETAMINOPHEN 5-325 MG PO TABS
1.0000 | ORAL_TABLET | ORAL | Status: DC | PRN
  Administered 2018-09-01: 1 via ORAL
  Administered 2018-09-01: 2 via ORAL
  Administered 2018-09-02: 1 via ORAL
  Filled 2018-09-01: qty 1
  Filled 2018-09-01 (×2): qty 2

## 2018-09-01 MED ORDER — ONDANSETRON HCL 4 MG PO TABS: 4.0000 mg | ORAL_TABLET | ORAL | Status: DC | PRN

## 2018-09-01 MED ORDER — DIPHENHYDRAMINE HCL 25 MG PO CAPS: 25.0000 mg | ORAL_CAPSULE | Freq: Four times a day (QID) | ORAL | Status: DC | PRN

## 2018-09-01 MED ORDER — BENZOCAINE-MENTHOL 20-0.5 % EX AERO: 1.0000 "application " | INHALATION_SPRAY | CUTANEOUS | Status: DC | PRN

## 2018-09-01 MED ORDER — IBUPROFEN 800 MG PO TABS
800.0000 mg | ORAL_TABLET | Freq: Three times a day (TID) | ORAL | Status: DC | PRN
  Administered 2018-09-01 (×2): 800 mg via ORAL
  Filled 2018-09-01 (×2): qty 1

## 2018-09-01 NOTE — Progress Notes (Signed)
Post Partum Day 1 Subjective: up ad lib, voiding and does report moderate cramping when nursing somewhat relieved with percocet and motrin  Objective: Blood pressure 93/61, pulse 80, temperature 98 F (36.7 C), temperature source Oral, resp. rate 18, height 5\' 2"  (1.575 m), weight 73 kg, last menstrual period 08/20/2017, SpO2 98 %, unknown if currently breastfeeding.  Physical Exam:  General: alert, cooperative, appears stated age and fatigued Lochia: appropriate Uterine Fundus: firm Incision: NA DVT Evaluation: No evidence of DVT seen on physical exam. Negative Homan's sign.  Recent Labs    08/31/18 0618 09/01/18 0528  HGB 10.3* 9.4*  HCT 34.9* 31.3*    Assessment/Plan: Plan for discharge tomorrow, Breastfeeding and Circumcision prior to discharge Infant feeding  Both    LOS: 1 day   Ronnald Shedden N Jahmel Flannagan 09/01/2018, 6:18 PM

## 2018-09-01 NOTE — Anesthesia Postprocedure Evaluation (Signed)
Anesthesia Post Note  Patient: Stephanie Logan  Procedure(s) Performed: AN AD HOC LABOR EPIDURAL  Patient location during evaluation: Mother Baby Anesthesia Type: Epidural Level of consciousness: awake and alert Pain management: pain level controlled Vital Signs Assessment: post-procedure vital signs reviewed and stable Respiratory status: spontaneous breathing, nonlabored ventilation and respiratory function stable Cardiovascular status: stable Postop Assessment: no headache, no backache and epidural receding Anesthetic complications: no     Last Vitals:  Vitals:   09/01/18 0152 09/01/18 0315  BP: 109/67 104/73  Pulse: 94 81  Resp: 18 20  Temp: 36.7 C 36.6 C  SpO2: 98% 98%    Last Pain:  Vitals:   09/01/18 0630  TempSrc:   PainSc: 5                  Haruki Arnold,  Clearnce Sorrel

## 2018-09-01 NOTE — Plan of Care (Signed)
Vs stable; up ad lib BUT last time up to void pt got dizzy, pt knows to call out for assistance out of the bed; tolerating regular diet; taking motrin and tylenol for pain control

## 2018-09-01 NOTE — Lactation Note (Signed)
Lactation Consultation Note  Patient Name: Stephanie Logan Date: 09/01/2018 Reason for consult: Initial assessment   Maternal Data  Mother has a history of poor breast feeding success and post partem depression. We discussed this in length including that the two are linked.  Feeding    LATCH Score                   Interventions    Lactation Tools Discussed/Used     Consult Status      Daryel November 09/01/2018, 10:46 AM

## 2018-09-02 NOTE — Discharge Summary (Signed)
Obstetric Discharge Summary Reason for Admission: induction of labor Prenatal Procedures: NST and ultrasound Intrapartum Procedures: spontaneous vaginal delivery Postpartum Procedures: none Complications-Operative and Postpartum: none  Delivery Note At 8:36 PM a viable and healthy female was delivered via Vaginal, Spontaneous.  APGAR: , ; weight 8 lb 0.8 oz (3650 g)  Anesthesia:   Episiotomy: None Lacerations:   Suture Repair: NA Est. Blood Loss (mL): 200  Mom to postpartum.  Baby to Couplet care / Skin to Skin.  Reyann Troop N Lelend Heinecke 09/02/2018, 12:14 PM    .     H/H:  Lab Results  Component Value Date/Time   HGB 9.4 (L) 09/01/2018 05:28 AM   HGB 9.6 (L) 06/17/2018 09:43 AM   HCT 31.3 (L) 09/01/2018 05:28 AM   HCT 30.1 (L) 06/17/2018 09:43 AM    Discharge Diagnoses: Term Pregnancy-delivered  Discharge Information: Date: 09/02/2018 Activity: pelvic rest Diet: routine Baby feeding: plans to breastfeed Contraception: vasectomy Medications: PNV, Ibuprofen and Colace Condition: stable Instructions: refer to practice specific booklet Discharge to: home  Renesme Kerrigan,CNM 09/02/2018,12:14 PM

## 2018-09-02 NOTE — Progress Notes (Signed)
Stephanie Logan, CNM contacted to notify that patient requests discharge to home, but states intermittent dizziness and chest pressure are not resolved.  CNM states that pt may discharge to home and return to ED if symptoms increase or persist.  Offered to let patient stay longer for observation, but patient states she wants to go home and will return to ED if needed. Reed Breech, RN 09/02/2018

## 2018-09-02 NOTE — Progress Notes (Signed)
Pt discharged with infant.  Discharge instructions, prescriptions and follow up appointment given to and reviewed with pt. Pt verbalized understanding. Escorted out by staff. 

## 2018-09-02 NOTE — Discharge Instructions (Signed)
No strenuous activity or heavy lifting for 6 weeks.  No intercourse, tampons, or douching for 6 weeks.  No tub baths- showers only.  No driving for 2 weeks or while taking pain medications.  Increase calories and fluids while breastfeeding.  Continue prenatal vitamin and iron.

## 2018-09-02 NOTE — Lactation Note (Signed)
This note was copied from a baby's chart. Lactation Consultation Note  Patient Name: Stephanie Logan [id number] Date: 09/02/2018     Maternal Data    Feeding    LATCH Score                   Interventions    Lactation Tools Discussed/Used     Consult Status  Mother is breastfeeding upon arrival of [name] to the room. Mother states that infant just came back from a circumcision. Infant is latched with no discomfort for mom with audible gulps heard. Mother states that breastfeeding is going well so far and denies any questions or concerns at this time.    [name] 83/25/4982, 2:50 PM

## 2018-09-02 NOTE — Progress Notes (Addendum)
Patient complains of "pressure in chest" and intermittent dizziness.  Patient denies any history of cardiac issues.  Melody Wharton, CNM contacted to report patient complaint of symptoms and cardiac murmur.  Melody states to check vital signs and call if patient reports that symptoms increase.  CNM states that no further orders are needed at this time. Reed Breech, RN 09/02/2018

## 2018-09-08 ENCOUNTER — Encounter: Payer: Self-pay | Admitting: Obstetrics and Gynecology

## 2018-09-08 ENCOUNTER — Ambulatory Visit (INDEPENDENT_AMBULATORY_CARE_PROVIDER_SITE_OTHER): Admitting: Obstetrics and Gynecology

## 2018-09-08 VITALS — BP 104/64 | HR 98 | Ht 63.0 in | Wt 147.2 lb

## 2018-09-08 DIAGNOSIS — T148XXA Other injury of unspecified body region, initial encounter: Secondary | ICD-10-CM | POA: Diagnosis not present

## 2018-09-22 NOTE — Progress Notes (Signed)
  Subjective:     Patient ID: Stephanie Logan, female   DOB: 02-Sep-1991, 28 y.o.   MRN: [id number]  HPI Here with c/o persistant tightness around diaphragm since delivery of infant. Slight shortness of breath with activity which she noted before delivery. Denies any alleviating factors, or other associated symptoms.  Review of Systems  Respiratory: Positive for chest tightness and shortness of breath.   Gastrointestinal: Positive for abdominal pain.  All other systems reviewed and are negative.      Objective:   Physical Exam A&Ox4 Well groomed female in no distress Blood pressure 104/64, pulse 98, height _0  (1.6 m), weight 147 lb 3.2 oz (66.8 kg), currently breastfeeding. HRR lungs clear bilaterally Abdomen soft and not tender. CVA negative, gallbladder normal on exam.  Pelvic not indicated    Assessment:     Muscle strain of upper abdomen    Plan:     Reassured of normal findings. To let office know if symptoms worsen or are not relieved with tylenol/motrin and rest.  RTC as needed.   Melody Shambley,CNM

## 2018-09-30 ENCOUNTER — Other Ambulatory Visit: Payer: Self-pay | Admitting: Obstetrics and Gynecology

## 2018-09-30 MED ORDER — ALPRAZOLAM 0.5 MG PO TABS: 0.5000 mg | ORAL_TABLET | Freq: Two times a day (BID) | ORAL | 1 refills | Status: DC | PRN

## 2018-10-12 ENCOUNTER — Encounter: Payer: Self-pay | Admitting: Obstetrics and Gynecology

## 2018-10-12 ENCOUNTER — Ambulatory Visit: Payer: Self-pay | Admitting: Urology

## 2018-10-12 ENCOUNTER — Ambulatory Visit (INDEPENDENT_AMBULATORY_CARE_PROVIDER_SITE_OTHER): Admitting: Obstetrics and Gynecology

## 2018-10-12 NOTE — Progress Notes (Signed)
POSTPARTUM EXAM SUMMARY  SUBJECTIVE: Patient Active Problem List   Diagnosis Date Noted  . Kidney stone complicating pregnancy 52/77/8242  . Vitamin B 12 deficiency 05/30/2018  . History of IUFD 02/07/2017  . Chronic pain of right hip 02/18/2016  . Chronic pain of right knee 02/18/2016  . Depression with anxiety 02/18/2016  . Severe episode of recurrent major depressive disorder, without psychotic features ([place])   . PCOS (polycystic ovarian syndrome) 07/19/2015   Current Outpatient Medications  Medication Sig Dispense Refill  . ALPRAZolam (XANAX) 0.5 MG tablet Take 1 tablet (0.5 mg total) by mouth 2 (two) times daily as needed for anxiety. 60 tablet 1  . Iron-FA-B Cmp-C-Biot-Probiotic (FUSION PLUS PO) Take by mouth.    . Prenatal Vit-Fe Fumarate-FA (PRENATAL MULTIVITAMIN) TABS tablet Take 1 tablet by mouth daily at 12 noon.    . sertraline (ZOLOFT) 100 MG tablet Take 1 tablet (100 mg total) by mouth daily. 90 tablet 2  . thiamine (VITAMIN B-1) 100 MG tablet Take 100 mg by mouth daily.     No current facility-administered medications for this visit.    Past Medical History:  Diagnosis Date  . Amenorrhea   . Anxiety   . PCOS (polycystic ovarian syndrome)   . Post partum depression    Past Surgical History:  Procedure Laterality Date  . BREAST FIBROADENOMA SURGERY  2008,2010   Family History  Problem Relation Age of Onset  . Cancer Maternal Grandmother        brain  . Cancer Mother        cervical, precancerous  . Seizures Sister    Social History   Tobacco Use  . Smoking status: Never Smoker  . Smokeless tobacco: Never Used  Substance Use Topics  . Alcohol use: Not Currently    Comment: occas  . Drug use: No   Social History   Social History Narrative  . Not on file    Ms. Hoel is a 28 y.o. who is now 6  weeks postpartum. OB History    Gravida  5   Para  2   Term  1   Preterm  1   AB  1   Living  3     SAB  1   TAB  0   Ectopic  0   Multiple  0   Live Births  3          Method of delivery: normal spontaneous vaginal delivery She is not breast-feeding and is not experiencing problems. Pregnancy complications: anemia. She is feeling well, happy and that she has no suicidal/homicidal inclination. She currently uses abstinence for contraception. She plans to use vasectomy for contraception.  OBJECTIVE: Date of last Pap smear: 05/2017 Physical Exam: GENERAL APPEARANCE: alert, well appearing, in no apparent distress, oriented to person, place and time THYROID: no thyromegaly or masses present BREASTS: no masses noted, no significant tenderness, no palpable axillary nodes, no skin changes LUNGS: clear to auscultation, no wheezes, rales or rhonchi, symmetric air entry HEART: regular rate and rhythm, no murmurs ABDOMEN POSTPARTUM: benign non-tender, without masses or organomegaly palpable CERVIX POSTPARTUM: normal, well-healed, without lesions ASSESSMENT: normal postpartum exam PLAN:  See orders and Patient Instructions Contraceptive counseling for condoms Resume all normal activities

## 2018-10-12 NOTE — Patient Instructions (Signed)

## 2018-10-13 LAB — CBC
Hematocrit: 36.2 % (ref 34.0–46.6)
Hemoglobin: 11.8 g/dL (ref 11.1–15.9)
MCH: 24.6 pg — ABNORMAL LOW (ref 26.6–33.0)
MCHC: 32.6 g/dL (ref 31.5–35.7)
MCV: 75 fL — ABNORMAL LOW (ref 79–97)
Platelets: 266 10*3/uL (ref 150–450)
RBC: 4.8 x10E6/uL (ref 3.77–5.28)
RDW: 20.7 % — ABNORMAL HIGH (ref 11.7–15.4)
WBC: 5.8 10*3/uL (ref 3.4–10.8)

## 2018-10-13 LAB — VITAMIN D 25 HYDROXY (VIT D DEFICIENCY, FRACTURES): Vit D, 25-Hydroxy: 35.2 ng/mL (ref 30.0–100.0)

## 2018-10-13 LAB — FERRITIN: Ferritin: 21 ng/mL (ref 15–150)

## 2018-10-16 NOTE — Progress Notes (Signed)
10/17/2018 10:03 AM   Stephanie Logan 25-Feb-1991 [id number]  Referring provider: Katheren Logan [address] [place], [place] [address]  Chief Complaint  Patient presents with  . Nephrolithiasis    HPI: Stephanie Logan is a 28 y.o. female who presents today for f/u of nephrolithiasis.  She gave birth 08/2018 for the fourth time, and has denied stones with previous pregnancies though she did pass a stone shortly after the delivery of her first child.  She had an abdominal ultrasound performed on 06/02/2018 which did not show cholelithiasis, but did incidentally note an 8 mm nonobstructing right upper pole renal calcus.  As of 06/13/2018, she was asymptomatic and experiencing no bothersome urinary tract symptoms.  She did state that she was having pain in at the time of the ultrasound.  KUB on 10/17/2018 showed a 4 mm nonobstructing right upper pole renal calcus.  Patient reports about two weeks ago some severe flank pain, dull ache not worth taking something for.  RUS was advised with the possibility of shockwave lithotropsy, patient was interested.  PMH: Past Medical History:  Diagnosis Date  . Amenorrhea   . Anxiety   . PCOS (polycystic ovarian syndrome)   . Post partum depression     Surgical History: Past Surgical History:  Procedure Laterality Date  . BREAST FIBROADENOMA SURGERY  2008,2010    Home Medications:  Allergies as of 10/17/2018      Reactions   Ciprofloxacin Rash      Medication List       Accurate as of October 17, 2018 10:03 AM. Always use your most recent med list.        ALPRAZolam 0.5 MG tablet Commonly known as:  XANAX Take 1 tablet (0.5 mg total) by mouth 2 (two) times daily as needed for anxiety.   FUSION PLUS PO Take by mouth.   prenatal multivitamin Tabs tablet Take 1 tablet by mouth daily at 12 noon.   sertraline 100 MG tablet Commonly known as:  ZOLOFT Take 1 tablet (100 mg total) by mouth daily.   thiamine 100  MG tablet Commonly known as:  VITAMIN B-1 Take 100 mg by mouth daily.       Allergies:  Allergies  Allergen Reactions  . Ciprofloxacin Rash    Family History: Family History  Problem Relation Age of Onset  . Cancer Maternal Grandmother        brain  . Cancer Mother        cervical, precancerous  . Seizures Sister     Social History:  reports that she has never smoked. She has never used smokeless tobacco. She reports previous alcohol use. She reports that she does not use drugs.  ROS: UROLOGY Frequent Urination?: No Hard to postpone urination?: No Burning/pain with urination?: No Get up at night to urinate?: Yes Leakage of urine?: No Urine stream starts and stops?: No Trouble starting stream?: No Do you have to strain to urinate?: No Blood in urine?: No Urinary tract infection?: No Sexually transmitted disease?: No Injury to kidneys or bladder?: No Painful intercourse?: No Weak stream?: No Currently pregnant?: No Vaginal bleeding?: Yes Last menstrual period?: Postpartum  Gastrointestinal Nausea?: No Vomiting?: No Indigestion/heartburn?: No Diarrhea?: No Constipation?: No  Constitutional Fever: No Night sweats?: Yes Weight loss?: No Fatigue?: No  Skin Skin rash/lesions?: No Itching?: Yes  Eyes Blurred vision?: No Double vision?: No  Ears/Nose/Throat Sore throat?: No Sinus problems?: No  Hematologic/Lymphatic Swollen glands?: No Easy bruising?: No  Cardiovascular  Leg swelling?: No Chest pain?: No  Respiratory Cough?: No Shortness of breath?: No  Endocrine Excessive thirst?: No  Musculoskeletal Back pain?: No Joint pain?: No  Neurological Headaches?: No Dizziness?: No  Psychologic Depression?: Yes Anxiety?: Yes  Physical Exam: BP 117/78 (BP Location: Left Arm, Patient Position: Sitting, Cuff Size: Normal)   Pulse 74   Ht 5\' 3"  (1.6 m)   Wt 147 lb 14.4 oz (67.1 kg)   BMI 26.20 kg/m   Constitutional: Well nourished.  Alert and oriented, No acute distress. Cardiovascular: No clubbing, cyanosis, or edema. Respiratory: Normal respiratory effort, no increased work of breathing. Skin: No rashes, bruises or suspicious lesions. Neurologic: Grossly intact, no focal deficits, moving all 4 extremities. Psychiatric: Normal mood and affect.   Laboratory Data: Lab Results  Component Value Date   WBC 5.8 10/12/2018   HGB 11.8 10/12/2018   HCT 36.2 10/12/2018   MCV 75 (L) 10/12/2018   PLT 266 10/12/2018    Lab Results  Component Value Date   CREATININE 0.48 07/30/2018     Pertinent Imaging: KUB was personally reviewed  Assessment & Plan:    1. Right Nephrolithiasis - Intermittent right back pain - Schedule a renal ultrasound to correlate with today's KUB findings - Patient may opt for shockwave lithotripsy   [facility] [address], [place], [place] [address] [phone]  I, Adele Schilder, am acting as a scribe for John Giovanni, MD.    I, Abbie Sons, MD, have reviewed all documentation for this visit. The documentation on 10/17/18 for the exam, diagnosis, procedures, and orders are all accurate and complete.

## 2018-10-17 ENCOUNTER — Encounter: Payer: Self-pay | Admitting: Urology

## 2018-10-17 ENCOUNTER — Ambulatory Visit
Admission: RE | Admit: 2018-10-17 | Discharge: 2018-10-17 | Disposition: A | Discharge status: Home / Self Care | Source: Ambulatory Visit | Attending: Urology | Admitting: Urology

## 2018-10-17 ENCOUNTER — Ambulatory Visit
Admission: RE | Admit: 2018-10-17 | Discharge: 2018-10-17 | Disposition: A | Discharge status: Home / Self Care | Attending: Urology | Admitting: Urology

## 2018-10-17 ENCOUNTER — Ambulatory Visit (INDEPENDENT_AMBULATORY_CARE_PROVIDER_SITE_OTHER): Admitting: Urology

## 2018-10-17 ENCOUNTER — Other Ambulatory Visit: Payer: Self-pay | Admitting: Urology

## 2018-10-17 VITALS — BP 117/78 | HR 74 | Ht 63.0 in | Wt 147.9 lb

## 2018-10-17 DIAGNOSIS — N200 Calculus of kidney: Secondary | ICD-10-CM

## 2018-10-17 NOTE — Patient Instructions (Signed)
Dietary Guidelines to Help Prevent Kidney Stones Kidney stones are deposits of minerals and salts that form inside your kidneys. Your risk of developing kidney stones may be greater depending on your diet, your lifestyle, the medicines you take, and whether you have certain medical conditions. Most people can reduce their chances of developing kidney stones by following the instructions below. Depending on your overall health and the type of kidney stones you tend to develop, your dietitian may give you more specific instructions. What are tips for following this plan? Reading food labels  Choose foods with "no salt added" or "low-salt" labels. Limit your sodium intake to less than 1500 mg per day.  Choose foods with calcium for each meal and snack. Try to eat about 300 mg of calcium at each meal. Foods that contain 200-500 mg of calcium per serving include: ? 8 oz (237 ml) of milk, fortified nondairy milk, and fortified fruit juice. ? 8 oz (237 ml) of kefir, yogurt, and soy yogurt. ? 4 oz (118 ml) of tofu. ? 1 oz of cheese. ? 1 cup (300 g) of dried figs. ? 1 cup (91 g) of cooked broccoli. ? 1-3 oz can of sardines or mackerel.  Most people need 1000 to 1500 mg of calcium each day. Talk to your dietitian about how much calcium is recommended for you. Shopping  Buy plenty of fresh fruits and vegetables. Most people do not need to avoid fruits and vegetables, even if they contain nutrients that may contribute to kidney stones.  When shopping for convenience foods, choose: ? Whole pieces of fruit. ? Premade salads with dressing on the side. ? Low-fat fruit and yogurt smoothies.  Avoid buying frozen meals or prepared deli foods.  Look for foods with live cultures, such as yogurt and kefir. Cooking  Do not add salt to food when cooking. Place a salt shaker on the table and allow each person to add his or her own salt to taste.  Use vegetable protein, such as beans, textured vegetable  protein (TVP), or tofu instead of meat in pasta, casseroles, and soups. Meal planning   Eat less salt, if told by your dietitian. To do this: ? Avoid eating processed or premade food. ? Avoid eating fast food.  Eat less animal protein, including cheese, meat, poultry, or fish, if told by your dietitian. To do this: ? Limit the number of times you have meat, poultry, fish, or cheese each week. Eat a diet free of meat at least 2 days a week. ? Eat only one serving each day of meat, poultry, fish, or seafood. ? When you prepare animal protein, cut pieces into small portion sizes. For most meat and fish, one serving is about the size of one deck of cards.  Eat at least 5 servings of fresh fruits and vegetables each day. To do this: ? Keep fruits and vegetables on hand for snacks. ? Eat 1 piece of fruit or a handful of berries with breakfast. ? Have a salad and fruit at lunch. ? Have two kinds of vegetables at dinner.  Limit foods that are high in a substance called oxalate. These include: ? Spinach. ? Rhubarb. ? Beets. ? Potato chips and french fries. ? Nuts.  If you regularly take a diuretic medicine, make sure to eat at least 1-2 fruits or vegetables high in potassium each day. These include: ? Avocado. ? Banana. ? Orange, prune, carrot, or tomato juice. ? Baked potato. ? Cabbage. ? Beans and split   If you regularly take a diuretic medicine, make sure to eat at least 1-2 fruits or vegetables high in potassium each day. These include:  ? Avocado.  ? Banana.  ? Orange, prune, carrot, or tomato juice.  ? Baked potato.  ? Cabbage.  ? Beans and split peas.  General instructions     Drink enough fluid to keep your urine clear or pale yellow. This is the most important thing you can do.   Talk to your health care provider and dietitian about taking daily supplements. Depending on your health and the cause of your kidney stones, you may be advised:  ? Not to take supplements with vitamin C.  ? To take a calcium supplement.  ? To take a daily probiotic supplement.  ? To take other supplements such as magnesium, fish oil, or vitamin B6.   Take all medicines and supplements as told by your health care provider.   Limit alcohol intake to no  more than 1 drink a day for nonpregnant women and 2 drinks a day for men. One drink equals 12 oz of beer, 5 oz of wine, or 1 oz of hard liquor.   Lose weight if told by your health care provider. Work with your dietitian to find strategies and an eating plan that works best for you.  What foods are not recommended?  Limit your intake of the following foods, or as told by your dietitian. Talk to your dietitian about specific foods you should avoid based on the type of kidney stones and your overall health.  Grains  Breads. Bagels. Rolls. Baked goods. Salted crackers. Cereal. Pasta.  Vegetables  Spinach. Rhubarb. Beets. Canned vegetables. Pickles. Olives.  Meats and other protein foods  Nuts. Nut butters. Large portions of meat, poultry, or fish. Salted or cured meats. Deli meats. Hot dogs. Sausages.  Dairy  Cheese.  Beverages  Regular soft drinks. Regular vegetable juice.  Seasonings and other foods  Seasoning blends with salt. Salad dressings. Canned soups. Soy sauce. Ketchup. Barbecue sauce. Canned pasta sauce. Casseroles. Pizza. Lasagna. Frozen meals. Potato chips. French fries.  Summary   You can reduce your risk of kidney stones by making changes to your diet.   The most important thing you can do is drink enough fluid. You should drink enough fluid to keep your urine clear or pale yellow.   Ask your health care provider or dietitian how much protein from animal sources you should eat each day, and also how much salt and calcium you should have each day.  This information is not intended to replace advice given to you by your health care provider. Make sure you discuss any questions you have with your health care provider.  Document Released: 01/02/2011 Document Revised: 08/18/2016 Document Reviewed: 08/18/2016  Elsevier Interactive Patient Education  2019 [employer] Inc[employer]

## 2018-10-24 ENCOUNTER — Other Ambulatory Visit

## 2018-10-24 DIAGNOSIS — E538 Deficiency of other specified B group vitamins: Secondary | ICD-10-CM

## 2018-10-25 LAB — VITAMIN B12: Vitamin B-12: 479 pg/mL (ref 232–1245)

## 2018-10-28 ENCOUNTER — Encounter: Payer: Self-pay | Admitting: *Deleted

## 2018-11-15 ENCOUNTER — Other Ambulatory Visit: Payer: Self-pay | Admitting: Obstetrics and Gynecology

## 2018-11-15 MED ORDER — ARIPIPRAZOLE 5 MG PO TABS: 5.0000 mg | ORAL_TABLET | Freq: Every day | ORAL | 6 refills | Status: DC

## 2018-11-18 ENCOUNTER — Ambulatory Visit
Admission: RE | Admit: 2018-11-18 | Discharge: 2018-11-18 | Disposition: A | Discharge status: Home / Self Care | Source: Ambulatory Visit | Attending: Urology | Admitting: Urology

## 2018-11-18 DIAGNOSIS — N200 Calculus of kidney: Secondary | ICD-10-CM | POA: Insufficient documentation

## 2018-11-21 ENCOUNTER — Telehealth: Payer: Self-pay

## 2018-11-21 NOTE — Telephone Encounter (Signed)
-----   Message from Abbie Sons, MD sent at 11/19/2018  1:41 PM EST ----- No stone was seen on renal ultrasound.  A small left renal cyst was present.

## 2018-11-21 NOTE — Telephone Encounter (Signed)
Mychart message sent. See documentation.

## 2019-02-10 ENCOUNTER — Encounter: Payer: Self-pay | Admitting: Obstetrics and Gynecology

## 2019-03-31 ENCOUNTER — Other Ambulatory Visit: Payer: Self-pay | Admitting: Obstetrics and Gynecology

## 2019-03-31 DIAGNOSIS — R7989 Other specified abnormal findings of blood chemistry: Secondary | ICD-10-CM

## 2019-03-31 DIAGNOSIS — L299 Pruritus, unspecified: Secondary | ICD-10-CM

## 2019-03-31 DIAGNOSIS — E538 Deficiency of other specified B group vitamins: Secondary | ICD-10-CM

## 2019-05-26 ENCOUNTER — Other Ambulatory Visit: Payer: Self-pay | Admitting: Obstetrics and Gynecology

## 2019-05-31 ENCOUNTER — Other Ambulatory Visit: Payer: Self-pay

## 2019-05-31 ENCOUNTER — Other Ambulatory Visit (HOSPITAL_COMMUNITY)
Admission: RE | Admit: 2019-05-31 | Discharge: 2019-05-31 | Disposition: A | Discharge status: Home / Self Care | Source: Ambulatory Visit | Attending: Obstetrics and Gynecology | Admitting: Obstetrics and Gynecology

## 2019-05-31 ENCOUNTER — Encounter: Payer: Self-pay | Admitting: Obstetrics and Gynecology

## 2019-05-31 ENCOUNTER — Ambulatory Visit (INDEPENDENT_AMBULATORY_CARE_PROVIDER_SITE_OTHER): Admitting: Obstetrics and Gynecology

## 2019-05-31 VITALS — BP 114/78 | HR 70 | Ht 63.0 in | Wt 150.8 lb

## 2019-05-31 DIAGNOSIS — Z01419 Encounter for gynecological examination (general) (routine) without abnormal findings: Secondary | ICD-10-CM

## 2019-05-31 DIAGNOSIS — R7989 Other specified abnormal findings of blood chemistry: Secondary | ICD-10-CM

## 2019-05-31 DIAGNOSIS — Z23 Encounter for immunization: Secondary | ICD-10-CM

## 2019-05-31 DIAGNOSIS — E538 Deficiency of other specified B group vitamins: Secondary | ICD-10-CM

## 2019-05-31 DIAGNOSIS — L299 Pruritus, unspecified: Secondary | ICD-10-CM

## 2019-05-31 DIAGNOSIS — E559 Vitamin D deficiency, unspecified: Secondary | ICD-10-CM

## 2019-05-31 NOTE — Patient Instructions (Addendum)
 Preventive Care 21-30 Years Old, Female Preventive care refers to visits with your health care provider and lifestyle choices that can promote health and wellness. This includes:  A yearly physical exam. This may also be called an annual well check.  Regular dental visits and eye exams.  Immunizations.  Screening for certain conditions.  Healthy lifestyle choices, such as eating a healthy diet, getting regular exercise, not using drugs or products that contain nicotine and tobacco, and limiting alcohol use. What can I expect for my preventive care visit? Physical exam Your health care provider will check your:  Height and weight. This may be used to calculate body mass index (BMI), which tells if you are at a healthy weight.  Heart rate and blood pressure.  Skin for abnormal spots. Counseling Your health care provider may ask you questions about your:  Alcohol, tobacco, and drug use.  Emotional well-being.  Home and relationship well-being.  Sexual activity.  Eating habits.  Work and work environment.  Method of birth control.  Menstrual cycle.  Pregnancy history. What immunizations do I need?  Influenza (flu) vaccine  This is recommended every year. Tetanus, diphtheria, and pertussis (Tdap) vaccine  You may need a Td booster every 10 years. Varicella (chickenpox) vaccine  You may need this if you have not been vaccinated. Human papillomavirus (HPV) vaccine  If recommended by your health care provider, you may need three doses over 6 months. Measles, mumps, and rubella (MMR) vaccine  You may need at least one dose of MMR. You may also need a second dose. Meningococcal conjugate (MenACWY) vaccine  One dose is recommended if you are age 19-21 years and a first-year college student living in a residence hall, or if you have one of several medical conditions. You may also need additional booster doses. Pneumococcal conjugate (PCV13) vaccine  You may need  this if you have certain conditions and were not previously vaccinated. Pneumococcal polysaccharide (PPSV23) vaccine  You may need one or two doses if you smoke cigarettes or if you have certain conditions. Hepatitis A vaccine  You may need this if you have certain conditions or if you travel or work in places where you may be exposed to hepatitis A. Hepatitis B vaccine  You may need this if you have certain conditions or if you travel or work in places where you may be exposed to hepatitis B. Haemophilus influenzae type b (Hib) vaccine  You may need this if you have certain conditions. You may receive vaccines as individual doses or as more than one vaccine together in one shot (combination vaccines). Talk with your health care provider about the risks and benefits of combination vaccines. What tests do I need?  Blood tests  Lipid and cholesterol levels. These may be checked every 5 years starting at age 20.  Hepatitis C test.  Hepatitis B test. Screening  Diabetes screening. This is done by checking your blood sugar (glucose) after you have not eaten for a while (fasting).  Sexually transmitted disease (STD) testing.  BRCA-related cancer screening. This may be done if you have a family history of breast, ovarian, tubal, or peritoneal cancers.  Pelvic exam and Pap test. This may be done every 3 years starting at age 21. Starting at age 30, this may be done every 5 years if you have a Pap test in combination with an HPV test. Talk with your health care provider about your test results, treatment options, and if necessary, the need for more   tests. Follow these instructions at home: Eating and drinking   Eat a diet that includes fresh fruits and vegetables, whole grains, lean protein, and low-fat dairy.  Take vitamin and mineral supplements as recommended by your health care provider.  Do not drink alcohol if: ? Your health care provider tells you not to drink. ? You are  pregnant, may be pregnant, or are planning to become pregnant.  If you drink alcohol: ? Limit how much you have to 0-1 drink a day. ? Be aware of how much alcohol is in your drink. In the [place] one drink equals one 12 oz bottle of beer (355 mL), one 5 oz glass of wine (148 mL), or one 1 oz glass of hard liquor (44 mL). Lifestyle  Take daily care of your teeth and gums.  Stay active. Exercise for at least 30 minutes on 5 or more days each week.  Do not use any products that contain nicotine or tobacco, such as cigarettes, e-cigarettes, and chewing tobacco. If you need help quitting, ask your health care provider.  If you are sexually active, practice safe sex. Use a condom or other form of birth control (contraception) in order to prevent pregnancy and STIs (sexually transmitted infections). If you plan to become pregnant, see your health care provider for a preconception visit. What's next?  Visit your health care provider once a year for a well check visit.  Ask your health care provider how often you should have your eyes and teeth checked.  Stay up to date on all vaccines. This information is not intended to replace advice given to you by your health care provider. Make sure you discuss any questions you have with your health care provider. Document Released: 11/03/2001 Document Revised: 05/19/2018 Document Reviewed: 05/19/2018 Elsevier Patient Education  [employer].   Vit E 800 IU daily for a week or two as needed for breast pain   Fibrocystic Breast Changes  Fibrocystic breast changes are changes that can make your breasts swollen or painful. These changes happen when tiny sacs of fluid (cysts) form in the breast. This is a common condition. It does not mean that you have cancer. It usually happens because of hormone changes during a monthly period. Follow these instructions at home:  Check your breasts after every monthly period. If you do not have monthly periods,  check your breasts on the first day of every month. Check for: ? Soreness. ? New swelling or puffiness. ? A change in breast size. ? A change in a lump that was already there.  Take over-the-counter and prescription medicines only as told by your doctor.  Wear a support or sports bra that fits well. Wear this support especially when you are exercising.  Avoid or have less caffeine, fat, and sugar in what you eat and drink as told by your doctor. Contact a doctor if:  You have fluid coming from your nipple, especially if the fluid has blood in it.  You have new lumps or bumps in your breast.  Your breast gets puffy, red, and painful.  You have changes in how your breast looks.  Your nipple looks flat or it sinks into your breast. Get help right away if:  Your breast turns red, and the redness is spreading. Summary  Fibrocystic breast changes are changes that can make your breasts swollen or painful.  This condition can happen when you have hormone changes during your monthly period.  With this condition, it is important  to check your breasts after every monthly period. If you do not have monthly periods, check your breasts on the first day of every month. This information is not intended to replace advice given to you by your health care provider. Make sure you discuss any questions you have with your health care provider. Document Released: 08/20/2008 Document Revised: 12/29/2018 Document Reviewed: 05/21/2016 Elsevier Patient Education  2020 [employer].

## 2019-05-31 NOTE — Progress Notes (Signed)
Subjective:   Stephanie Logan is a 28 y.o. [phone] Caucasian female here for a routine well-woman exam.  Patient's last menstrual period was 05/10/2019.    Current complaints: right breast lump x 4 weeks ago, tender swollen lymph node behind right ear x 5 days, menses heavy and occurring every 5 weeks.  PCP: me       does desire labs  Social History: Sexual: heterosexual Marital Status: married Living situation: with family Occupation: homemaker Tobacco/alcohol: no tobacco use Illicit drugs: no history of illicit drug use  The following portions of the patient's history were reviewed and updated as appropriate: allergies, current medications, past family history, past medical history, past social history, past surgical history and problem list.  Past Medical History Past Medical History:  Diagnosis Date  . Amenorrhea   . Anxiety   . PCOS (polycystic ovarian syndrome)   . Post partum depression     Past Surgical History Past Surgical History:  Procedure Laterality Date  . BREAST FIBROADENOMA SURGERY  2008,2010    Gynecologic History [id number]  Patient's last menstrual period was 05/10/2019. Contraception: coitus interruptus Last Pap: 2018. Results were: normal   Obstetric History OB History  Gravida Para Term Preterm AB Living  5 2 1 1 1 3   SAB TAB Ectopic Multiple Live Births  1 0 0 0 3    # Outcome Date GA Lbr Len/2nd Weight Sex Delivery Anes PTL Lv  5 Term 08/31/18 [redacted]w[redacted]d  8 lb 0.8 oz (3.65 kg) M Vag-Spont EPI  LIV  4 Preterm 02/07/17 [redacted]w[redacted]d  6 lb (2.722 kg) F  EPI  FD  3 Gravida 01/27/15    M Vag-Spont   LIV  2 SAB 01/2014        ND  1 Gravida 01/19/13    M Vag-Spont   LIV     Complications: Anal sphincter tear in delivery without 3rd degree perineal laceration    Current Medications Current Outpatient Medications on File Prior to Visit  Medication Sig Dispense Refill  . ALPRAZolam (XANAX) 0.5 MG tablet Take 1 tablet (0.5 mg total) by mouth 2 (two) times  daily as needed for anxiety. 60 tablet 1  . sertraline (ZOLOFT) 100 MG tablet TAKE 1 TABLET(100 MG) BY MOUTH DAILY 90 tablet 2  . thiamine (VITAMIN B-1) 100 MG tablet Take 100 mg by mouth daily.     No current facility-administered medications on file prior to visit.     Review of Systems Patient denies any headaches, blurred vision, shortness of breath, chest pain, abdominal pain, problems with bowel movements, urination, or intercourse.  Objective:  BP 114/78   Pulse 70   Ht 5\' 3"  (1.6 m)   Wt 150 lb 12.8 oz (68.4 kg)   LMP 05/10/2019   Breastfeeding No   BMI 26.71 kg/m  Physical Exam  General:  Well developed, well nourished, no acute distress. She is alert and oriented x3. Skin:  Warm and dry Neck:  Midline trachea, no thyromegaly or nodules except 32mm tender lymph node behind right ear and scratch noted above it on scalp. Cardiovascular: Regular rate and rhythm, no murmur heard Lungs:  Effort normal, all lung fields clear to auscultation bilaterally Breasts:  No dominant palpable mass, retraction, or nipple discharge, bilateral fibrocystic breast changes noted in upper/outer quadrants. Abdomen:  Soft, non tender, no hepatosplenomegaly or masses Pelvic:  External genitalia is normal in appearance.  The vagina is normal in appearance. The cervix is bulbous, no CMT.  Thin prep  pap is done with HR HPV cotesting. Uterus is felt to be normal size, shape, and contour.  No adnexal masses or tenderness noted. Extremities:  No swelling or varicosities noted Psych:  She has a normal mood and affect  Assessment:   Healthy well-woman exam BMI 26 Fibrocystic breast b12 deficiency menorrhagia  Plan:  Labs obtained- will follow up accordingly Reassured of normal breast changes. Will watch lymph node behind ear and let me know if persists more than a month.   Discussed taking time for self and stress relieving techniques- will try to institute a few days a week.  F/U 1 year for AE, or  sooner if needed    Markella Dao Rockney Ghee, CNM

## 2019-06-02 LAB — VITAMIN D 25 HYDROXY (VIT D DEFICIENCY, FRACTURES): Vit D, 25-Hydroxy: 35.9 ng/mL (ref 30.0–100.0)

## 2019-06-02 LAB — CYTOLOGY - PAP
Diagnosis: NEGATIVE
HPV: NOT DETECTED

## 2019-06-02 LAB — CBC
Hematocrit: 42.4 % (ref 34.0–46.6)
Hemoglobin: 13.9 g/dL (ref 11.1–15.9)
MCH: 28 pg (ref 26.6–33.0)
MCHC: 32.8 g/dL (ref 31.5–35.7)
MCV: 86 fL (ref 79–97)
Platelets: 296 10*3/uL (ref 150–450)
RBC: 4.96 x10E6/uL (ref 3.77–5.28)
RDW: 14.1 % (ref 11.7–15.4)
WBC: 5.5 10*3/uL (ref 3.4–10.8)

## 2019-06-02 LAB — BILE ACIDS, TOTAL: Bile Acids Total: 3.6 umol/L (ref 0.0–10.0)

## 2019-06-02 LAB — COMPREHENSIVE METABOLIC PANEL
ALT: 14 IU/L (ref 0–32)
AST: 18 IU/L (ref 0–40)
Albumin/Globulin Ratio: 2.1 (ref 1.2–2.2)
Albumin: 5.1 g/dL — ABNORMAL HIGH (ref 3.9–5.0)
Alkaline Phosphatase: 64 IU/L (ref 39–117)
BUN/Creatinine Ratio: 15 (ref 9–23)
BUN: 12 mg/dL (ref 6–20)
Bilirubin Total: 0.5 mg/dL (ref 0.0–1.2)
CO2: 26 mmol/L (ref 20–29)
Calcium: 10 mg/dL (ref 8.7–10.2)
Chloride: 97 mmol/L (ref 96–106)
Creatinine, Ser: 0.81 mg/dL (ref 0.57–1.00)
GFR calc Af Amer: 113 mL/min/{1.73_m2} (ref >59)
GFR calc non Af Amer: 98 mL/min/{1.73_m2} (ref >59)
Globulin, Total: 2.4 g/dL (ref 1.5–4.5)
Glucose: 83 mg/dL (ref 65–99)
Potassium: 4.3 mmol/L (ref 3.5–5.2)
Sodium: 138 mmol/L (ref 134–144)
Total Protein: 7.5 g/dL (ref 6.0–8.5)

## 2019-06-02 LAB — B12 AND FOLATE PANEL
Folate: 11.6 ng/mL (ref >3.0)
Vitamin B-12: 558 pg/mL (ref 232–1245)

## 2019-06-02 LAB — FERRITIN: Ferritin: 19 ng/mL (ref 15–150)

## 2019-09-14 MED ORDER — ALPRAZOLAM 0.5 MG PO TABS: 0.5000 mg | ORAL_TABLET | Freq: Two times a day (BID) | ORAL | 0 refills | Status: DC | PRN

## 2019-09-14 NOTE — Telephone Encounter (Signed)
I will send in a refill for now as we are close to the holidays. She will need to see JML prior to an annual for medication review if she needs any further refills.

## 2019-11-25 IMAGING — CR DG CHEST 2V
1 series · 2 of 2 positions shown · non-contrast
Comparison: None.

CLINICAL DATA: Upper chest pain over the past day.

EXAM:
CHEST - 2 VIEW

[Series 1: dg chest 2 view · 0.14mm/px · 2 of 2 slices shown]
[im 1/2]
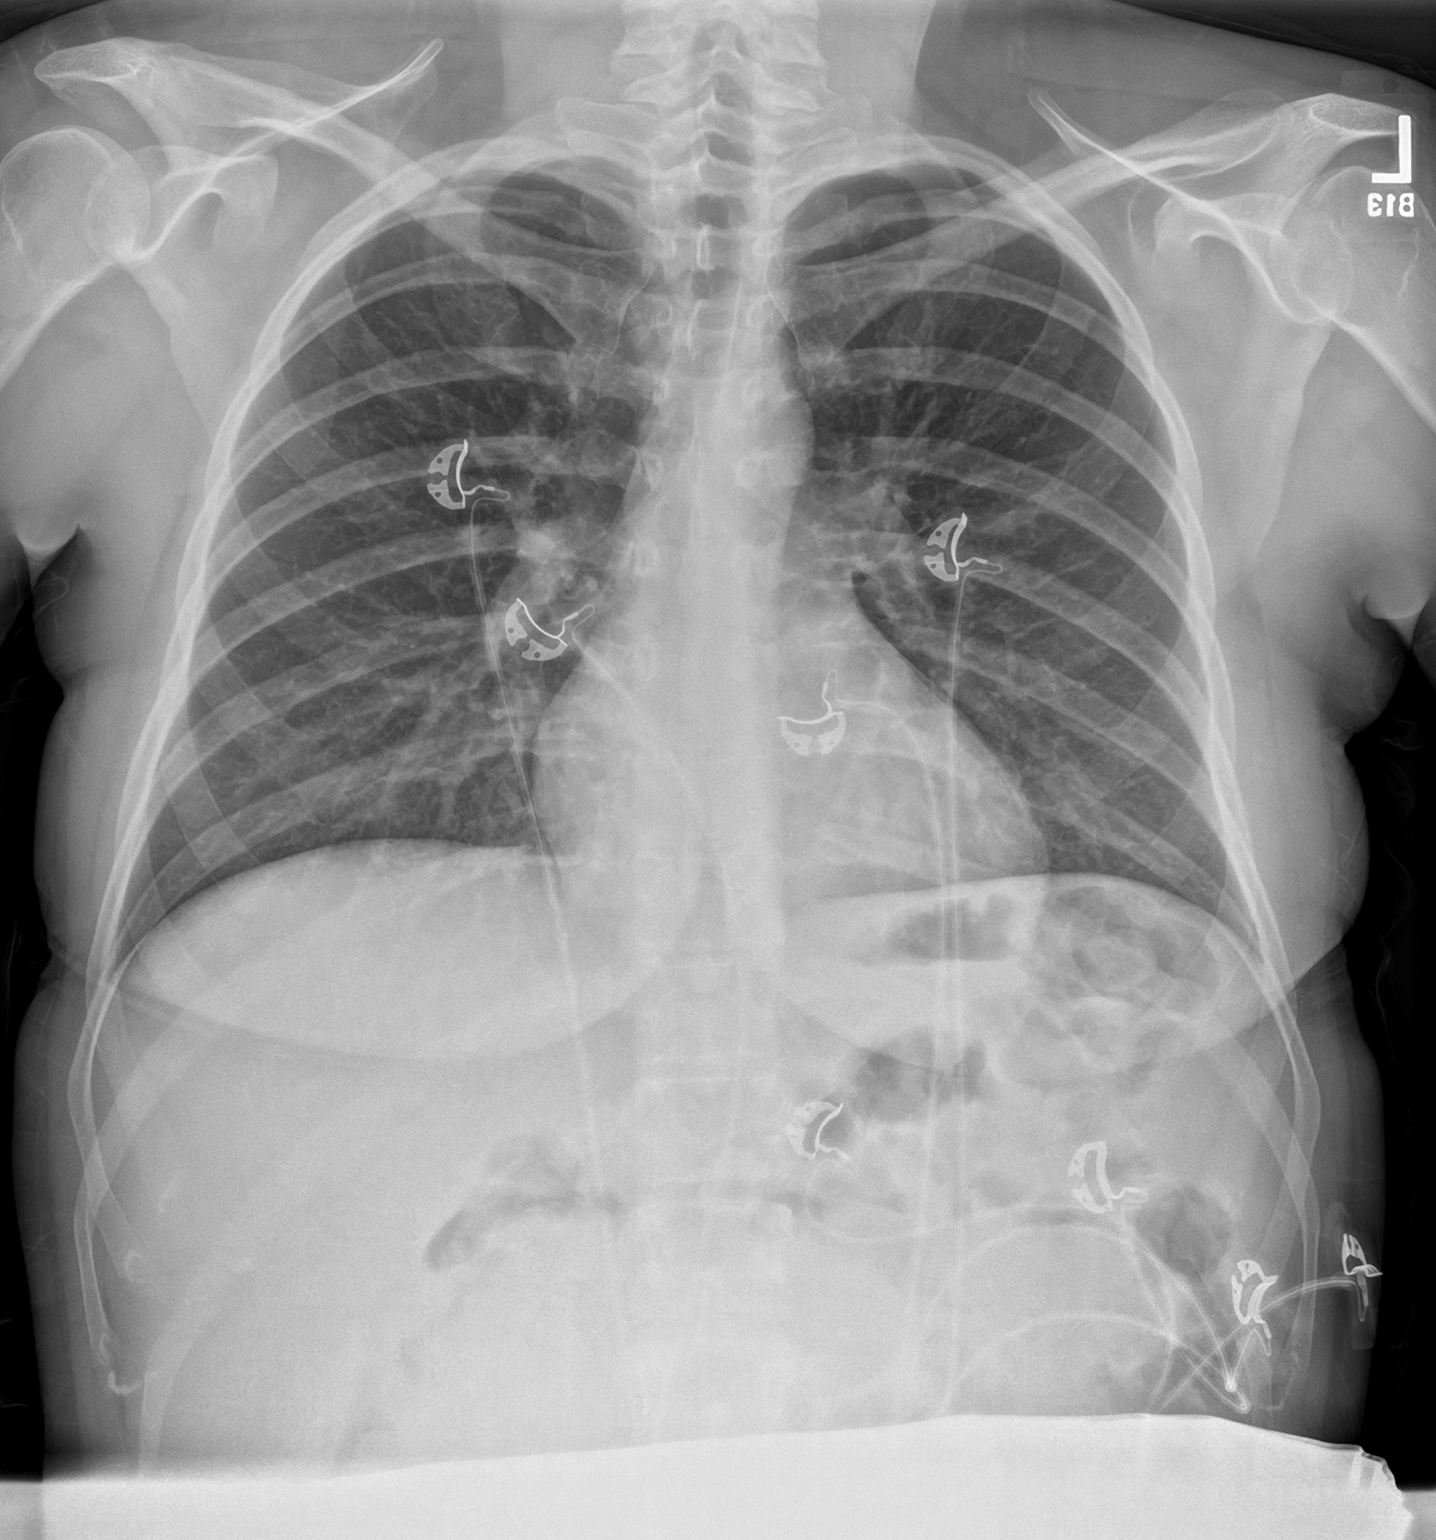
[im 2/2]
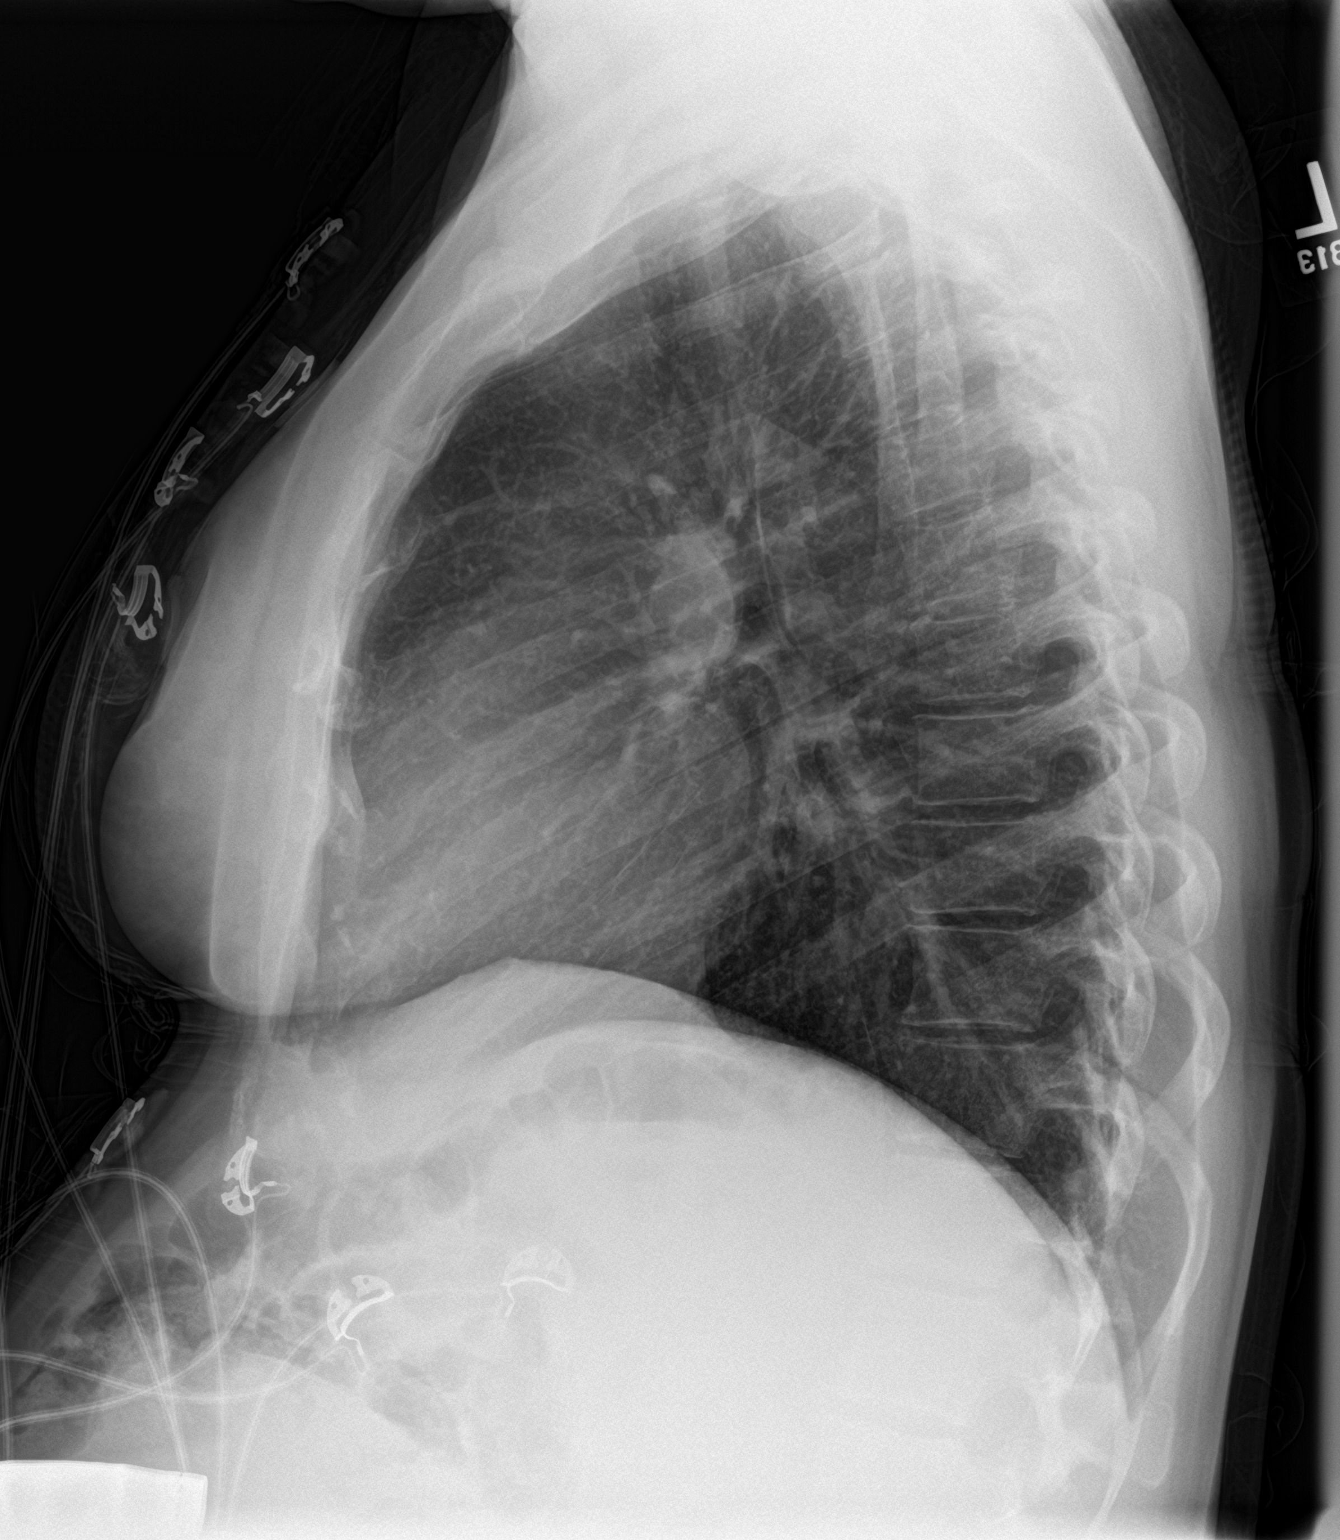

[2 of 2 positions shown; findings below may reference images not displayed]

FINDINGS: Low lung volumes are present, causing crowding of the pulmonary
vasculature.

The lungs appear clear.  Cardiac and mediastinal contours normal.

No pleural effusion identified.
IMPRESSION: 1.  No active cardiopulmonary disease is radiographically apparent.

## 2019-12-26 ENCOUNTER — Emergency Department

## 2019-12-26 ENCOUNTER — Other Ambulatory Visit: Payer: Self-pay

## 2019-12-26 ENCOUNTER — Emergency Department
Admission: EM | Admit: 2019-12-26 | Discharge: 2019-12-26 | Disposition: A | Discharge status: Home / Self Care | Attending: Emergency Medicine | Admitting: Emergency Medicine

## 2019-12-26 DIAGNOSIS — R519 Headache, unspecified: Secondary | ICD-10-CM | POA: Insufficient documentation

## 2019-12-26 DIAGNOSIS — R4182 Altered mental status, unspecified: Secondary | ICD-10-CM | POA: Diagnosis not present

## 2019-12-26 DIAGNOSIS — R42 Dizziness and giddiness: Secondary | ICD-10-CM | POA: Diagnosis present

## 2019-12-26 DIAGNOSIS — R404 Transient alteration of awareness: Secondary | ICD-10-CM | POA: Insufficient documentation

## 2019-12-26 LAB — URINE DRUG SCREEN, QUALITATIVE (ARMC ONLY)
Amphetamines, Ur Screen: NOT DETECTED
Barbiturates, Ur Screen: NOT DETECTED
Benzodiazepine, Ur Scrn: NOT DETECTED
Cannabinoid 50 Ng, Ur ~~LOC~~: POSITIVE — AB
Cocaine Metabolite,Ur ~~LOC~~: NOT DETECTED
MDMA (Ecstasy)Ur Screen: NOT DETECTED
Methadone Scn, Ur: NOT DETECTED
Opiate, Ur Screen: NOT DETECTED
Phencyclidine (PCP) Ur S: NOT DETECTED
Tricyclic, Ur Screen: POSITIVE — AB

## 2019-12-26 LAB — BASIC METABOLIC PANEL
Anion gap: 8 (ref 5–15)
BUN: 8 mg/dL (ref 6–20)
CO2: 26 mmol/L (ref 22–32)
Calcium: 9.3 mg/dL (ref 8.9–10.3)
Chloride: 106 mmol/L (ref 98–111)
Creatinine, Ser: 0.74 mg/dL (ref 0.44–1.00)
GFR calc Af Amer: >60 mL/min (ref >60)
GFR calc non Af Amer: >60 mL/min (ref >60)
Glucose, Bld: 101 mg/dL — ABNORMAL HIGH (ref 70–99)
Potassium: 3.9 mmol/L (ref 3.5–5.1)
Sodium: 140 mmol/L (ref 135–145)

## 2019-12-26 LAB — CBC
HCT: 39.9 % (ref 36.0–46.0)
Hemoglobin: 12.9 g/dL (ref 12.0–15.0)
MCH: 28.5 pg (ref 26.0–34.0)
MCHC: 32.3 g/dL (ref 30.0–36.0)
MCV: 88.3 fL (ref 80.0–100.0)
Platelets: 279 10*3/uL (ref 150–400)
RBC: 4.52 MIL/uL (ref 3.87–5.11)
RDW: 13.2 % (ref 11.5–15.5)
WBC: 7.4 10*3/uL (ref 4.0–10.5)
nRBC: 0 % (ref 0.0–0.2)

## 2019-12-26 LAB — URINALYSIS, COMPLETE (UACMP) WITH MICROSCOPIC
Bacteria, UA: NONE SEEN
Bilirubin Urine: NEGATIVE
Glucose, UA: NEGATIVE mg/dL
Hgb urine dipstick: NEGATIVE
Ketones, ur: NEGATIVE mg/dL
Leukocytes,Ua: NEGATIVE
Nitrite: NEGATIVE
Protein, ur: NEGATIVE mg/dL
Specific Gravity, Urine: 1.013 (ref 1.005–1.030)
pH: 5 (ref 5.0–8.0)

## 2019-12-26 LAB — LIPASE, BLOOD: Lipase: 20 U/L (ref 11–51)

## 2019-12-26 MED ORDER — ACETAMINOPHEN 500 MG PO TABS
1000.0000 mg | ORAL_TABLET | Freq: Once | ORAL | Status: AC
  Administered 2019-12-26: 10:00:00 1000 mg via ORAL
  Filled 2019-12-26: qty 2

## 2019-12-26 NOTE — ED Notes (Signed)
EDP At bedside.

## 2019-12-26 NOTE — ED Provider Notes (Signed)
[facility] Emergency Department Provider Note ____________________________________________   First MD Initiated Contact with Patient 12/26/19 [phone]     (approximate)  I have reviewed the triage vital signs and the nursing notes.   HISTORY  Chief Complaint Dizziness    HPI Stephanie Logan is a 29 y.o. female with PMH as noted below who presents with an episode of confusion or altered mental status earlier tonight.  The patient states that she awoke at 2:30 AM and felt like she experienced some missing time.  After this, she remembers being awake but felt like she could not speak or communicate and thought that she was having a twitching or spasm in her face that her husband could not see.  The patient states that she took CBD last night to help her sleep, and states that she has taken it a few times before but got the CBD from a different source.  She denies any other drug use.  Her only medication is Zoloft.  Patient denies any previous similar episodes like this.  She states that she feels much better now although still is somewhat groggy and has a headache.  Past Medical History:  Diagnosis Date  . Amenorrhea   . Anxiety   . PCOS (polycystic ovarian syndrome)   . Post partum depression     Patient Active Problem List   Diagnosis Date Noted  . Liver hemangioma 08/01/2018  . Abnormal genetic test 08/01/2018  . [redacted] weeks gestation of pregnancy 08/01/2018  . Nephrolithiasis 06/25/2018  . Vitamin B 12 deficiency 05/30/2018  . History of IUFD 02/07/2017  . Chronic pain of right hip 02/18/2016  . Chronic pain of right knee 02/18/2016  . Depression with anxiety 02/18/2016  . Severe episode of recurrent major depressive disorder, without psychotic features ([place])   . PCOS (polycystic ovarian syndrome) 07/19/2015    Past Surgical History:  Procedure Laterality Date  . BREAST FIBROADENOMA SURGERY  2008,2010    Prior to Admission medications     Medication Sig Start Date End Date Taking? Authorizing Provider  ALPRAZolam Duanne Moron) 0.5 MG tablet Take 1 tablet (0.5 mg total) by mouth 2 (two) times daily as needed for anxiety. 09/14/19  Yes Rubie Maid, MD  ascorbic acid (VITAMIN C) 500 MG tablet Take 500 mg by mouth daily.   Yes [provider]  CANNABIDIOL PO Take 1 Dose by mouth as directed.   Yes [provider]  ELDERBERRY PO Take 1 Dose by mouth as directed.   Yes [provider]  sertraline (ZOLOFT) 100 MG tablet TAKE 1 TABLET(100 MG) BY MOUTH DAILY Patient taking differently: Take 100 mg by mouth at bedtime.  05/26/19  Yes Shambley, Melody N, CNM  thiamine (VITAMIN B-1) 100 MG tablet Take 100 mg by mouth daily.   Yes [provider]    Allergies Ciprofloxacin  Family History  Problem Relation Age of Onset  . Cancer Maternal Grandmother        brain  . Cancer Mother        cervical, precancerous  . Seizures Sister   . Lung cancer Sister   . Hashimoto's thyroiditis Brother   . Learning disabilities Son     Social History Social History   Tobacco Use  . Smoking status: Never Smoker  . Smokeless tobacco: Never Used  Substance Use Topics  . Alcohol use: Not Currently    Comment: occas  . Drug use: No    Review of Systems  Constitutional:  No fever. Eyes: No redness. ENT: No sore throat. Cardiovascular: Denies chest pain. Respiratory: Denies shortness of breath. Gastrointestinal: No vomiting or diarrhea.  Genitourinary: Negative for dysuria.  Musculoskeletal: Negative for back pain. Skin: Negative for rash. Neurological: Negative for headache.   ____________________________________________   PHYSICAL EXAM:  VITAL SIGNS: ED Triage Vitals  Enc Vitals Group     BP 12/26/19 0444 129/69     Pulse Rate 12/26/19 0444 (!) 110     Resp 12/26/19 0444 16     Temp 12/26/19 0444 99.4 F (37.4 C)     Temp Source 12/26/19 0444 Oral     SpO2 12/26/19 0444 97 %     Weight  12/26/19 0447 145 lb (65.8 kg)     Height 12/26/19 0447 5\' 2"  (1.575 m)     Head Circumference --      Peak Flow --      Pain Score 12/26/19 0447 5     Pain Loc --      Pain Edu? --      Excl. in [place]? --     Constitutional: Alert and oriented. Well appearing and in no acute distress. Eyes: Conjunctivae are normal.  EOMI.  PERRLA. Head: Atraumatic. Nose: No congestion/rhinnorhea. Mouth/Throat: Mucous membranes are moist.   Neck: Normal range of motion.  Cardiovascular: Normal rate, regular rhythm.  Good peripheral circulation. Respiratory: Normal respiratory effort.  No retractions.  Gastrointestinal: No distention.  Musculoskeletal: Extremities warm and well perfused.  Neurologic:  Normal speech and language.  Motor and sensory intact in all extremities.  Normal coordination with no ataxia on finger-to-nose. Skin:  Skin is warm and dry. No rash noted. Psychiatric: Mood and affect are normal. Speech and behavior are normal.  ____________________________________________   LABS (all labs ordered are listed, but only abnormal results are displayed)  Labs Reviewed  BASIC METABOLIC PANEL - Abnormal; Notable for the following components:      Result Value   Glucose, Bld 101 (*)    All other components within normal limits  URINALYSIS, COMPLETE (UACMP) WITH MICROSCOPIC - Abnormal; Notable for the following components:   Color, Urine YELLOW (*)    APPearance CLEAR (*)    All other components within normal limits  URINE DRUG SCREEN, QUALITATIVE ([facility] ONLY) - Abnormal; Notable for the following components:   Tricyclic, Ur Screen POSITIVE (*)    Cannabinoid 50 Ng, Ur [place] POSITIVE (*)    All other components within normal limits  CBC  LIPASE, BLOOD  POC URINE PREG, ED   ____________________________________________  EKG  ED ECG REPORT I, Arta Silence, the attending physician, personally viewed and interpreted this ECG.  Date: 12/26/2019 EKG Time: 0503 Rate: 108 Rhythm:  Sinus tachycardia QRS Axis: normal Intervals: normal ST/T Wave abnormalities: normal Narrative Interpretation: no evidence of acute ischemia  ____________________________________________  RADIOLOGY  MR brain: No acute abnormality  ____________________________________________   PROCEDURES  Procedure(s) performed: No  Procedures  Critical Care performed: No ____________________________________________   INITIAL IMPRESSION / ASSESSMENT AND PLAN / ED COURSE  Pertinent labs & imaging results that were available during my care of the patient were reviewed by me and considered in my medical decision making (see chart for details).  29 year old female with PMH as noted above presents with an episode of confusion, possible altered mental status, and a sensation of missing time earlier this evening.  The patient states that she is now feeling much better although still feels somewhat groggy and has a headache.  She  reports using CBD but no illicit drugs or new medications.  On exam, the patient is relatively comfortable appearing.  Her vital signs are normal.  Physical exam is unremarkable.  Neurologic exam is normal at this time.  Overall I suspect most likely an episode related to the CBD.  The patient's urinalysis is positive for cannabinoids and TCAs, although I suspect the TCA may be a false positive and related to the Zoloft.  The other primary differential diagnosis would be a seizure with postictal state.  The patient has no prior seizure history.  Lab work-up shows no significant abnormalities.  I will obtain an MR brain for seizure work-up.  If this is negative, I anticipate discharge home.  ----------------------------------------- 1:34 PM on 12/26/2019 -----------------------------------------  MRI is normal.  The patient remains asymptomatic and states that she feels well.  She is stable for discharge home at this time.  I have given her neurology follow-up.  Return  precautions provided, she expresses understanding.  ____________________________________________   FINAL CLINICAL IMPRESSION(S) / ED DIAGNOSES  Final diagnoses:  Transient alteration of awareness      NEW MEDICATIONS STARTED DURING THIS VISIT:  New Prescriptions   No medications on file     Note:  This document was prepared using Dragon voice recognition software and may include unintentional dictation errors.    Arta Silence, MD 12/26/19 1334

## 2019-12-26 NOTE — ED Notes (Signed)
Pt noted walking around the [place] with a steady gait, in NAD at this time.

## 2019-12-26 NOTE — ED Triage Notes (Signed)
Pt to the er via [place] for "an out of body experience" Pt states to ems that she woke up at 0230 and feels like she is looking at herself in a mirror watching herself shake. Pt is on zoloft and xanax and took CBD last night but its not from their usual guy. Vital signs 130/80, heart rate 90, cbg 92, 100% on room air. Pt has a hx of panic and anxiety attacks. Intermittent controllable tremors present.

## 2019-12-26 NOTE — Discharge Instructions (Signed)
Your blood work and MRI are all normal.  It is possible you had a seizure.  You should avoid the CBD that you took last night.  We have provided referral for a neurologist for you to follow-up.  At this time, you do not need to change any of your activities.  Return to the ER if you have another similar episode, any seizure or convulsion, severe headache, confusion, weakness, or any other new or worsening symptoms that concern you.

## 2020-03-14 ENCOUNTER — Emergency Department

## 2020-03-14 ENCOUNTER — Emergency Department
Admission: EM | Admit: 2020-03-14 | Discharge: 2020-03-14 | Disposition: A | Discharge status: Home / Self Care | Attending: Emergency Medicine | Admitting: Emergency Medicine

## 2020-03-14 ENCOUNTER — Other Ambulatory Visit: Payer: Self-pay

## 2020-03-14 DIAGNOSIS — Y93I9 Activity, other involving external motion: Secondary | ICD-10-CM | POA: Diagnosis not present

## 2020-03-14 DIAGNOSIS — M79641 Pain in right hand: Secondary | ICD-10-CM | POA: Diagnosis not present

## 2020-03-14 DIAGNOSIS — Y92410 Unspecified street and highway as the place of occurrence of the external cause: Secondary | ICD-10-CM | POA: Insufficient documentation

## 2020-03-14 DIAGNOSIS — M79645 Pain in left finger(s): Secondary | ICD-10-CM

## 2020-03-14 DIAGNOSIS — M542 Cervicalgia: Secondary | ICD-10-CM | POA: Diagnosis present

## 2020-03-14 DIAGNOSIS — Y999 Unspecified external cause status: Secondary | ICD-10-CM | POA: Insufficient documentation

## 2020-03-14 DIAGNOSIS — M79632 Pain in left forearm: Secondary | ICD-10-CM

## 2020-03-14 DIAGNOSIS — R519 Headache, unspecified: Secondary | ICD-10-CM | POA: Diagnosis not present

## 2020-03-14 MED ORDER — TRAMADOL HCL 50 MG PO TABS
50.0000 mg | ORAL_TABLET | Freq: Once | ORAL | Status: AC
  Administered 2020-03-14: 50 mg via ORAL
  Filled 2020-03-14: qty 1

## 2020-03-14 MED ORDER — CYCLOBENZAPRINE HCL 5 MG PO TABS: 5.0000 mg | ORAL_TABLET | Freq: Three times a day (TID) | ORAL | 0 refills | Status: DC | PRN

## 2020-03-14 MED ORDER — NAPROXEN 500 MG PO TABS: 500.0000 mg | ORAL_TABLET | Freq: Two times a day (BID) | ORAL | 0 refills | Status: AC

## 2020-03-14 MED ORDER — CYCLOBENZAPRINE HCL 10 MG PO TABS
5.0000 mg | ORAL_TABLET | Freq: Once | ORAL | Status: AC
  Administered 2020-03-14: 5 mg via ORAL
  Filled 2020-03-14: qty 1
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
## 2020-03-14 NOTE — ED Notes (Signed)
Pt to US.
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
## 2020-03-16 ENCOUNTER — Encounter: Payer: Self-pay | Admitting: Intensive Care

## 2020-03-16 ENCOUNTER — Emergency Department
Admission: EM | Admit: 2020-03-16 | Discharge: 2020-03-16 | Disposition: A | Discharge status: Home / Self Care | Attending: Emergency Medicine | Admitting: Emergency Medicine

## 2020-03-16 ENCOUNTER — Emergency Department

## 2020-03-16 ENCOUNTER — Other Ambulatory Visit: Payer: Self-pay

## 2020-03-16 ENCOUNTER — Other Ambulatory Visit: Payer: Self-pay | Admitting: Internal Medicine

## 2020-03-16 DIAGNOSIS — U071 COVID-19: Secondary | ICD-10-CM | POA: Diagnosis not present

## 2020-03-16 DIAGNOSIS — R0602 Shortness of breath: Secondary | ICD-10-CM | POA: Diagnosis present

## 2020-03-16 LAB — CBC
HCT: 39.4 % (ref 36.0–46.0)
Hemoglobin: 13.3 g/dL (ref 12.0–15.0)
MCH: 28.7 pg (ref 26.0–34.0)
MCHC: 33.8 g/dL (ref 30.0–36.0)
MCV: 84.9 fL (ref 80.0–100.0)
Platelets: 265 10*3/uL (ref 150–400)
RBC: 4.64 MIL/uL (ref 3.87–5.11)
RDW: 12.7 % (ref 11.5–15.5)
WBC: 5.9 10*3/uL (ref 4.0–10.5)
nRBC: 0 % (ref 0.0–0.2)

## 2020-03-16 LAB — BASIC METABOLIC PANEL
Anion gap: 8 (ref 5–15)
BUN: 15 mg/dL (ref 6–20)
CO2: 24 mmol/L (ref 22–32)
Calcium: 9.3 mg/dL (ref 8.9–10.3)
Chloride: 104 mmol/L (ref 98–111)
Creatinine, Ser: 0.71 mg/dL (ref 0.44–1.00)
GFR calc Af Amer: >60 mL/min (ref >60)
GFR calc non Af Amer: >60 mL/min (ref >60)
Glucose, Bld: 142 mg/dL — ABNORMAL HIGH (ref 70–99)
Potassium: 3.9 mmol/L (ref 3.5–5.1)
Sodium: 136 mmol/L (ref 135–145)

## 2020-03-16 LAB — TROPONIN I (HIGH SENSITIVITY): Troponin I (High Sensitivity): <2 ng/L (ref <18)

## 2020-03-16 MED ORDER — PREDNISONE 10 MG PO TABS
ORAL_TABLET | ORAL | 0 refills | Status: DC
Start: 2020-03-16 — End: 2021-04-16

## 2020-03-16 MED ORDER — DEXAMETHASONE 10 MG/ML FOR PEDIATRIC ORAL USE
10.0000 mg | Freq: Once | INTRAMUSCULAR | Status: AC
  Administered 2020-03-16: 10 mg via ORAL
  Filled 2020-03-16: qty 1

## 2020-03-16 NOTE — ED Notes (Signed)
Patient maintain sats at 98% while ambulating in the room.

## 2020-03-16 NOTE — ED Triage Notes (Signed)
Pt c/o dizziness and "I feel like I'm breathing through a straw" Diagnosed with covid 6/17

## 2020-03-16 NOTE — ED Provider Notes (Signed)
[facility] Emergency Department Provider Note  ____________________________________________  Time seen: Approximately 3:32 PM  I have reviewed the triage vital signs and the nursing notes.   HISTORY  Chief Complaint Shortness of Breath and Dizziness    HPI Stephanie Logan is a 29 y.o. female that presents to the emergency department for evaluation of continued shortness of breath and cough since being diagnosed with Covid 9 days ago.  She feels like she is "breathing out of a straw." She does not have any chest pain but states that chest does feel tight but this may be due to anxiety.  Her husband had Covid just before she did and they have been in quarantine all month.  Patient is supposed to be out of quarantine tomorrow but symptoms have not improved.  She does have an inhaler at home but has not used it.  No fevers.  Past Medical History:  Diagnosis Date  . Amenorrhea   . Anxiety   . PCOS (polycystic ovarian syndrome)   . Post partum depression     Patient Active Problem List   Diagnosis Date Noted  . Liver hemangioma 08/01/2018  . Abnormal genetic test 08/01/2018  . [redacted] weeks gestation of pregnancy 08/01/2018  . Nephrolithiasis 06/25/2018  . Vitamin B 12 deficiency 05/30/2018  . History of IUFD 02/07/2017  . Chronic pain of right hip 02/18/2016  . Chronic pain of right knee 02/18/2016  . Depression with anxiety 02/18/2016  . Severe episode of recurrent major depressive disorder, without psychotic features ([place])   . PCOS (polycystic ovarian syndrome) 07/19/2015    Past Surgical History:  Procedure Laterality Date  . BREAST FIBROADENOMA SURGERY  2008,2010    Prior to Admission medications   Medication Sig Start Date End Date Taking? Authorizing Provider  ALPRAZolam Duanne Moron) 0.5 MG tablet Take 1 tablet (0.5 mg total) by mouth 2 (two) times daily as needed for anxiety. 09/14/19   Rubie Maid, MD  ascorbic acid (VITAMIN C) 500 MG tablet Take  500 mg by mouth daily.    [provider]  CANNABIDIOL PO Take 1 Dose by mouth as directed.    [provider]  cyclobenzaprine (FLEXERIL) 5 MG tablet Take 1 tablet (5 mg total) by mouth 3 (three) times daily as needed for muscle spasms. 03/14/20   Jearld Fenton, NP  ELDERBERRY PO Take 1 Dose by mouth as directed.    [provider]  naproxen (NAPROSYN) 500 MG tablet Take 1 tablet (500 mg total) by mouth 2 (two) times daily with a meal. 03/14/20 03/14/21  Baity, Coralie Keens, NP  predniSONE (DELTASONE) 10 MG tablet Take 6 tablets on day 1, take 5 tablets on day 2, take 4 tablets on day 3, take 3 tablets on day 4, take 2 tablets on day 5, take 1 tablet on day 6 03/16/20   Laban Emperor, PA-C  sertraline (ZOLOFT) 100 MG tablet TAKE 1 TABLET(100 MG) BY MOUTH DAILY Patient taking differently: Take 100 mg by mouth at bedtime.  05/26/19   Shambley, Melody N, CNM  thiamine (VITAMIN B-1) 100 MG tablet Take 100 mg by mouth daily.    [provider]    Allergies Ciprofloxacin  Family History  Problem Relation Age of Onset  . Cancer Maternal Grandmother        brain  . Cancer Mother        cervical, precancerous  . Seizures Sister   . Lung cancer Sister   . Hashimoto's thyroiditis  Brother   . Learning disabilities Son     Social History Social History   Tobacco Use  . Smoking status: Never Smoker  . Smokeless tobacco: Never Used  Vaping Use  . Vaping Use: Never used  Substance Use Topics  . Alcohol use: Yes    Comment: occas  . Drug use: No     Review of Systems  Constitutional: No fever/chills Eyes: No visual changes. No discharge. ENT: Positive for congestion and rhinorrhea. Cardiovascular: No chest pain. Respiratory: Positive for cough and SOB. Gastrointestinal: No abdominal pain.  No nausea, no vomiting.  No diarrhea.  No constipation. Musculoskeletal: Negative for musculoskeletal pain. Skin: Negative for rash, abrasions, lacerations,  ecchymosis. Neurological: Negative for headaches.   ____________________________________________   PHYSICAL EXAM:  VITAL SIGNS: ED Triage Vitals  Enc Vitals Group     BP 03/16/20 1317 110/67     Pulse Rate 03/16/20 1317 81     Resp 03/16/20 1317 20     Temp 03/16/20 1317 98.5 F (36.9 C)     Temp Source 03/16/20 1317 Oral     SpO2 03/16/20 1317 98 %     Weight 03/16/20 1318 145 lb (65.8 kg)     Height 03/16/20 1318 5\' 2"  (1.575 m)     Head Circumference --      Peak Flow --      Pain Score 03/16/20 1317 0     Pain Loc --      Pain Edu? --      Excl. in [place]? --      Constitutional: Alert and oriented. Well appearing and in no acute distress. Eyes: Conjunctivae are normal. PERRL. EOMI. No discharge. Head: Atraumatic. ENT: No frontal and maxillary sinus tenderness.      Ears: Tympanic membranes pearly gray with good landmarks. No discharge.      Nose: Mild congestion/rhinnorhea.      Mouth/Throat: Mucous membranes are moist. Oropharynx non-erythematous. Tonsils not enlarged. No exudates. Uvula midline. Neck: No stridor.   Hematological/Lymphatic/Immunilogical: No cervical lymphadenopathy. Cardiovascular: Normal rate, regular rhythm.  Good peripheral circulation. Respiratory: Normal respiratory effort without tachypnea or retractions. Lungs CTAB. Good air entry to the bases with no decreased or absent breath sounds. Gastrointestinal: Bowel sounds 4 quadrants. Soft and nontender to palpation. No guarding or rigidity. No palpable masses. No distention. Musculoskeletal: Full range of motion to all extremities. No gross deformities appreciated. Neurologic:  Normal speech and language. No gross focal neurologic deficits are appreciated.  Skin:  Skin is warm, dry and intact. No rash noted. Psychiatric: Mood and affect are normal. Speech and behavior are normal. Patient exhibits appropriate insight and judgement.   ____________________________________________   LABS (all labs  ordered are listed, but only abnormal results are displayed)  Labs Reviewed  BASIC METABOLIC PANEL - Abnormal; Notable for the following components:      Result Value   Glucose, Bld 142 (*)    All other components within normal limits  CBC  POC URINE PREG, ED  TROPONIN I (HIGH SENSITIVITY)   ____________________________________________  EKG   ____________________________________________  RADIOLOGY Robinette Haines, personally viewed and evaluated these images (plain radiographs) as part of my medical decision making, as well as reviewing the written report by the radiologist.  DG Chest 2 View  Result Date: 03/16/2020 CLINICAL DATA:  Chest discomfort, shortness of breath, and dizziness. COVID-19 virus infection. EXAM: CHEST - 2 VIEW COMPARISON:  07/30/2018 FINDINGS: The heart size and mediastinal contours are within normal limits.  Both lungs are clear. The visualized skeletal structures are unremarkable. IMPRESSION: Normal study. Electronically Signed   By: Marlaine Hind M.D.   On: 03/16/2020 14:05    ____________________________________________    PROCEDURES  Procedure(s) performed:    Procedures    Medications  dexamethasone (DECADRON) 10 MG/ML injection for Pediatric ORAL use 10 mg (10 mg Oral Given 03/16/20 1538)     ____________________________________________   INITIAL IMPRESSION / ASSESSMENT AND PLAN / ED COURSE  Pertinent labs & imaging results that were available during my care of the patient were reviewed by me and considered in my medical decision making (see chart for details).  Review of the [place] CSRS was performed in accordance of the [place] prior to dispensing any controlled drugs.   Patient's diagnosis is consistent with Covid 19. Vital signs and exam are reassuring.  Chest x-ray negative for acute cardiopulmonary processes.  Lab work is unremarkable.  Troponin is negative.  Sinus rhythm on EKG.  Oxygen saturations remained above 98% with ambulation.   Low suspicion for PE, as patient is not tachycardic, oxygen saturations remained above 98% and shortness of breath has been throughout having Covid.  Patient has an albuterol inhaler at home that she will try.  Patient was given a dose of Decadron in the emergency department.  Patient appears well and is staying well hydrated. Patient feels comfortable going home. Patient will be discharged home with prescriptions for prednisone. Patient is to follow up with PCP as needed or otherwise directed. Patient is given ED precautions to return to the ED for any worsening or new symptoms.  NASREEN GOEDECKE was evaluated in Emergency Department on 03/16/2020 for the symptoms described in the history of present illness. She was evaluated in the context of the global COVID-19 pandemic, which necessitated consideration that the patient might be at risk for infection with the SARS-CoV-2 virus that causes COVID-19. Institutional protocols and algorithms that pertain to the evaluation of patients at risk for COVID-19 are in a state of rapid change based on information released by regulatory bodies including the CDC and federal and state organizations. These policies and algorithms were followed during the patient's care in the ED.   ____________________________________________  FINAL CLINICAL IMPRESSION(S) / ED DIAGNOSES  Final diagnoses:  COVID-19      NEW MEDICATIONS STARTED DURING THIS VISIT:  ED Discharge Orders         Ordered    predniSONE (DELTASONE) 10 MG tablet     Discontinue  Reprint     03/16/20 1603              This chart was dictated using voice recognition software/Dragon. Despite best efforts to proofread, errors can occur which can change the meaning. Any change was purely unintentional.    Laban Emperor, PA-C 03/16/20 [place], MD 03/17/20 [phone]

## 2020-03-29 ENCOUNTER — Other Ambulatory Visit: Payer: Self-pay | Admitting: Obstetrics and Gynecology

## 2020-03-31 NOTE — Telephone Encounter (Signed)
Patient needs to be scheduled for a visit for her annual exam in September. She was previously a Stephanie Logan patient. Can continue with the midwives, or can be scheduled with an MD. Given 1 refill (90 day supply) until her appointment.

## 2020-04-08 NOTE — Telephone Encounter (Signed)
Spoke to pt and scheduled her for an annual exam with JML in September.

## 2020-05-10 ENCOUNTER — Ambulatory Visit
Admission: RE | Admit: 2020-05-10 | Discharge: 2020-05-10 | Disposition: A | Discharge status: Home / Self Care | Attending: Urology | Admitting: Urology

## 2020-05-10 ENCOUNTER — Ambulatory Visit
Admission: RE | Admit: 2020-05-10 | Discharge: 2020-05-10 | Disposition: A | Discharge status: Home / Self Care | Source: Ambulatory Visit | Attending: Urology | Admitting: Urology

## 2020-05-10 ENCOUNTER — Other Ambulatory Visit: Payer: Self-pay

## 2020-05-10 ENCOUNTER — Encounter: Payer: Self-pay | Admitting: Urology

## 2020-05-10 ENCOUNTER — Ambulatory Visit (INDEPENDENT_AMBULATORY_CARE_PROVIDER_SITE_OTHER): Admitting: Urology

## 2020-05-10 ENCOUNTER — Other Ambulatory Visit: Payer: Self-pay | Admitting: Radiology

## 2020-05-10 VITALS — BP 96/61 | HR 71 | Ht 63.0 in | Wt 145.0 lb

## 2020-05-10 DIAGNOSIS — N23 Unspecified renal colic: Secondary | ICD-10-CM | POA: Diagnosis not present

## 2020-05-10 DIAGNOSIS — N200 Calculus of kidney: Secondary | ICD-10-CM

## 2020-05-10 DIAGNOSIS — R1011 Right upper quadrant pain: Secondary | ICD-10-CM

## 2020-05-10 MED ORDER — OXYCODONE-ACETAMINOPHEN 5-325 MG PO TABS: 1.0000 | ORAL_TABLET | Freq: Four times a day (QID) | ORAL | 0 refills | Status: DC | PRN

## 2020-05-10 NOTE — H&P (View-Only) (Signed)
05/10/2020 3:58 PM   Stephanie Logan 1991/07/31 [id number]  Referring provider: No referring provider defined for this encounter.  Chief Complaint  Patient presents with  . Nephrolithiasis    follow up w/KUB    HPI: 29 y.o. female requested an acute visit today due to right flank pain.   Onset severe right flank pain 05/09/2020  No identifiable precipitating, aggravating or alleviating factors  Nonradiating  Positive nausea/sweats  No subjective fever  Pain subsequently improved and currently is described as dull   Initially seen September 2019 with a history of a right upper pole calculus  Similar episode flank pain January 2020 which showed a 4 mm calcification overlying the upper portion of the right renal outline  She was interested in shockwave lithotripsy  RUS ordered and no stone was identified  No recurrent severe pain until yesterday  Has noted intermittent right flank pain prior to yesterday   PMH: Past Medical History:  Diagnosis Date  . Amenorrhea   . Anxiety   . PCOS (polycystic ovarian syndrome)   . Post partum depression     Surgical History: Past Surgical History:  Procedure Laterality Date  . BREAST FIBROADENOMA SURGERY  2008,2010    Home Medications:  Allergies as of 05/10/2020      Reactions   Ciprofloxacin Rash      Medication List       Accurate as of May 10, 2020  3:58 PM. If you have any questions, ask your nurse or doctor.        STOP taking these medications   CANNABIDIOL PO Stopped by: Abbie Sons, MD     TAKE these medications   ALPRAZolam 0.5 MG tablet Commonly known as: Xanax Take 1 tablet (0.5 mg total) by mouth 2 (two) times daily as needed for anxiety.   ascorbic acid 500 MG tablet Commonly known as: VITAMIN C Take 500 mg by mouth daily.   cyclobenzaprine 5 MG tablet Commonly known as: FLEXERIL Take 1 tablet (5 mg total) by mouth 3 (three) times daily as needed for muscle spasms.     ELDERBERRY PO Take 1 Dose by mouth as directed.   naproxen 500 MG tablet Commonly known as: Naprosyn Take 1 tablet (500 mg total) by mouth 2 (two) times daily with a meal.   predniSONE 10 MG tablet Commonly known as: DELTASONE Take 6 tablets on day 1, take 5 tablets on day 2, take 4 tablets on day 3, take 3 tablets on day 4, take 2 tablets on day 5, take 1 tablet on day 6   sertraline 100 MG tablet Commonly known as: ZOLOFT Take 1 tablet (100 mg total) by mouth at bedtime.   thiamine 100 MG tablet Commonly known as: Vitamin B-1 Take 100 mg by mouth daily.       Allergies:  Allergies  Allergen Reactions  . Ciprofloxacin Rash    Family History: Family History  Problem Relation Age of Onset  . Cancer Maternal Grandmother        brain  . Cancer Mother        cervical, precancerous  . Seizures Sister   . Lung cancer Sister   . Hashimoto's thyroiditis Brother   . Learning disabilities Son     Social History:  reports that she has never smoked. She has never used smokeless tobacco. She reports current alcohol use. She reports that she does not use drugs.   Physical Exam: BP 96/61   Pulse 71  Ht 5\' 3"  (1.6 m)   Wt 145 lb (65.8 kg)   BMI 25.69 kg/m   Constitutional:  Alert and oriented, No acute distress. HEENT: [place] AT, moist mucus membranes.  Trachea midline, no masses. Cardiovascular: No clubbing, cyanosis, or edema. Respiratory: Normal respiratory effort, no increased work of breathing. Skin: No rashes, bruises or suspicious lesions. Neurologic: Grossly intact, no focal deficits, moving all 4 extremities. Psychiatric: Normal mood and affect.   Pertinent Imaging: KUB performed this morning reviewed and there is a 4 mm calcification overlying the midportion of the right renal outline.  This appears to be the same calcification seen overlying the superior portion of the right renal outline 10/10/2018   Assessment & Plan:    1. Nephrolithiasis  Previously  noted right upper pole calculus appears to have migrated to the renal pelvis  Episode yesterday may have been secondary to stone at the [facility]  Will schedule stone protocol CT of the abdomen pelvis and if stone verified she desires treatment. We discussed various treatment options for urolithiasis including observation with or without medical expulsive therapy, shockwave lithotripsy (SWL), ureteroscopy and laser lithotripsy with stent placement. We discussed that management is based on stone size, location, density, patient co-morbidities, and patient preference.  Stones <70mm in size have a >80% spontaneous passage rate. Data surrounding the use of tamsulosin for medical expulsive therapy is controversial, but meta analyses suggests it is most efficacious for distal stones between 5-37mm in size.  SWL has a lower stone free rate in a single procedure, but also a lower complication rate compared to ureteroscopy and avoids a stent and associated stent related symptoms. Possible complications include renal hematoma, steinstrasse, and need for additional treatment. Ureteroscopy with laser lithotripsy and stent placement has a higher stone free rate than SWL in a single procedure, however increased complication rate including possible infection, ureteral injury, bleeding, and stent related morbidity. Common stent related symptoms include dysuria, urgency/frequency, and flank pain. After a discussion of the risks and benefits of the above treatment options, the patient would like to proceed with SWL.  2.  Renal colic  Secondary to above   Abbie Sons, MD  [facility] [address] [place], [place] [address] [phone]

## 2020-05-10 NOTE — Progress Notes (Signed)
05/10/2020 3:58 PM   Stephanie Logan 10/02/1990 [id number]  Referring provider: No referring provider defined for this encounter.  Chief Complaint  Patient presents with  . Nephrolithiasis    follow up w/[facility]UB    HPI: 29 y.o. female requested an acute visit today due to right flank pain.   Onset severe right flank pain 05/09/2020  No identifiable precipitating, aggravating or alleviating factors  Nonradiating  Positive nausea/sweats  No subjective fever  Pain subsequently improved and currently is described as dull   Initially seen September 2019 with a history of a right upper pole calculus  Similar episode flank pain January 2020 which showed a 4 mm calcification overlying the upper portion of the right renal outline  She was interested in shockwave lithotripsy  RUS ordered and no stone was identified  No recurrent severe pain until yesterday  Has noted intermittent right flank pain prior to yesterday   PMH: Past Medical History:  Diagnosis Date  . Amenorrhea   . Anxiety   . PCOS (polycystic ovarian syndrome)   . Post partum depression     Surgical History: Past Surgical History:  Procedure Laterality Date  . BREAST FIBROADENOMA SURGERY  2008,2010    Home Medications:  Allergies as of 05/10/2020      Reactions   Ciprofloxacin Rash      Medication List       Accurate as of May 10, 2020  3:58 PM. If you have any questions, ask your nurse or doctor.        STOP taking these medications   CANNABIDIOL PO Stopped by: Abbie Sons, MD     TAKE these medications   ALPRAZolam 0.5 MG tablet Commonly known as: Xanax Take 1 tablet (0.5 mg total) by mouth 2 (two) times daily as needed for anxiety.   ascorbic acid 500 MG tablet Commonly known as: VITAMIN C Take 500 mg by mouth daily.   cyclobenzaprine 5 MG tablet Commonly known as: FLEXERIL Take 1 tablet (5 mg total) by mouth 3 (three) times daily as needed for muscle spasms.     ELDERBERRY PO Take 1 Dose by mouth as directed.   naproxen 500 MG tablet Commonly known as: Naprosyn Take 1 tablet (500 mg total) by mouth 2 (two) times daily with a meal.   predniSONE 10 MG tablet Commonly known as: DELTASONE Take 6 tablets on day 1, take 5 tablets on day 2, take 4 tablets on day 3, take 3 tablets on day 4, take 2 tablets on day 5, take 1 tablet on day 6   sertraline 100 MG tablet Commonly known as: ZOLOFT Take 1 tablet (100 mg total) by mouth at bedtime.   thiamine 100 MG tablet Commonly known as: Vitamin B-1 Take 100 mg by mouth daily.       Allergies:  Allergies  Allergen Reactions  . Ciprofloxacin Rash    Family History: Family History  Problem Relation Age of Onset  . Cancer Maternal Grandmother        brain  . Cancer Mother        cervical, precancerous  . Seizures Sister   . Lung cancer Sister   . Hashimoto's thyroiditis Brother   . Learning disabilities Son     Social History:  reports that she has never smoked. She has never used smokeless tobacco. She reports current alcohol use. She reports that she does not use drugs.   Physical Exam: BP 96/61   Pulse 71  Ht 5\' 3"  (1.6 m)   Wt 145 lb (65.8 kg)   BMI 25.69 kg/m   Constitutional:  Alert and oriented, No acute distress. HEENT: [place] AT, moist mucus membranes.  Trachea midline, no masses. Cardiovascular: No clubbing, cyanosis, or edema. Respiratory: Normal respiratory effort, no increased work of breathing. Skin: No rashes, bruises or suspicious lesions. Neurologic: Grossly intact, no focal deficits, moving all 4 extremities. Psychiatric: Normal mood and affect.   Pertinent Imaging: KUB performed this morning reviewed and there is a 4 mm calcification overlying the midportion of the right renal outline.  This appears to be the same calcification seen overlying the superior portion of the right renal outline 10/10/2018   Assessment & Plan:    1. Nephrolithiasis  Previously  noted right upper pole calculus appears to have migrated to the renal pelvis  Episode yesterday may have been secondary to stone at the [facility]  Will schedule stone protocol CT of the abdomen pelvis and if stone verified she desires treatment. We discussed various treatment options for urolithiasis including observation with or without medical expulsive therapy, shockwave lithotripsy (SWL), ureteroscopy and laser lithotripsy with stent placement. We discussed that management is based on stone size, location, density, patient co-morbidities, and patient preference.  Stones <86mm in size have a >80% spontaneous passage rate. Data surrounding the use of tamsulosin for medical expulsive therapy is controversial, but meta analyses suggests it is most efficacious for distal stones between 5-45mm in size.  SWL has a lower stone free rate in a single procedure, but also a lower complication rate compared to ureteroscopy and avoids a stent and associated stent related symptoms. Possible complications include renal hematoma, steinstrasse, and need for additional treatment. Ureteroscopy with laser lithotripsy and stent placement has a higher stone free rate than SWL in a single procedure, however increased complication rate including possible infection, ureteral injury, bleeding, and stent related morbidity. Common stent related symptoms include dysuria, urgency/frequency, and flank pain. After a discussion of the risks and benefits of the above treatment options, the patient would like to proceed with SWL.  2.  Renal colic  Secondary to above   Abbie Sons, MD  [facility] [address], [place], [place] [address] [phone]

## 2020-05-11 ENCOUNTER — Encounter: Payer: Self-pay | Admitting: Urology

## 2020-05-13 ENCOUNTER — Other Ambulatory Visit: Payer: Self-pay

## 2020-05-13 ENCOUNTER — Ambulatory Visit
Admission: RE | Admit: 2020-05-13 | Discharge: 2020-05-13 | Disposition: A | Discharge status: Home / Self Care | Source: Ambulatory Visit | Attending: Urology | Admitting: Urology

## 2020-05-13 DIAGNOSIS — N23 Unspecified renal colic: Secondary | ICD-10-CM | POA: Diagnosis not present

## 2020-05-13 LAB — MICROSCOPIC EXAMINATION

## 2020-05-13 LAB — URINALYSIS, COMPLETE
Bilirubin, UA: NEGATIVE
Glucose, UA: NEGATIVE
Ketones, UA: NEGATIVE
Nitrite, UA: NEGATIVE
Protein,UA: NEGATIVE
RBC, UA: NEGATIVE
Specific Gravity, UA: 1.02 (ref 1.005–1.030)
Urobilinogen, Ur: 0.2 mg/dL (ref 0.2–1.0)
pH, UA: 7.5 (ref 5.0–7.5)

## 2020-05-14 ENCOUNTER — Other Ambulatory Visit: Payer: Self-pay | Admitting: Radiology

## 2020-05-14 ENCOUNTER — Encounter: Payer: Self-pay | Admitting: Urology

## 2020-05-14 DIAGNOSIS — N200 Calculus of kidney: Secondary | ICD-10-CM

## 2020-05-16 ENCOUNTER — Ambulatory Visit
Admission: RE | Admit: 2020-05-16 | Discharge: 2020-05-16 | Disposition: A | Discharge status: Home / Self Care | Attending: Urology | Admitting: Urology

## 2020-05-16 ENCOUNTER — Other Ambulatory Visit: Payer: Self-pay

## 2020-05-16 ENCOUNTER — Encounter
Admission: RE | Disposition: A | Discharge status: Home / Self Care | Payer: Self-pay | Source: Home / Self Care | Attending: Urology

## 2020-05-16 ENCOUNTER — Encounter: Payer: Self-pay | Admitting: Urology

## 2020-05-16 ENCOUNTER — Ambulatory Visit

## 2020-05-16 DIAGNOSIS — I499 Cardiac arrhythmia, unspecified: Secondary | ICD-10-CM | POA: Insufficient documentation

## 2020-05-16 DIAGNOSIS — Z86018 Personal history of other benign neoplasm: Secondary | ICD-10-CM | POA: Diagnosis not present

## 2020-05-16 DIAGNOSIS — N200 Calculus of kidney: Secondary | ICD-10-CM | POA: Diagnosis not present

## 2020-05-16 DIAGNOSIS — F419 Anxiety disorder, unspecified: Secondary | ICD-10-CM | POA: Diagnosis not present

## 2020-05-16 DIAGNOSIS — E282 Polycystic ovarian syndrome: Secondary | ICD-10-CM | POA: Insufficient documentation

## 2020-05-16 DIAGNOSIS — Z881 Allergy status to other antibiotic agents status: Secondary | ICD-10-CM | POA: Insufficient documentation

## 2020-05-16 DIAGNOSIS — F329 Major depressive disorder, single episode, unspecified: Secondary | ICD-10-CM | POA: Diagnosis not present

## 2020-05-16 HISTORY — PX: EXTRACORPOREAL SHOCK WAVE LITHOTRIPSY: SHX1557

## 2020-05-16 LAB — POCT PREGNANCY, URINE: Preg Test, Ur: NEGATIVE

## 2020-05-16 SURGERY — LITHOTRIPSY, ESWL
Anesthesia: Moderate Sedation | Laterality: Right

## 2020-05-16 MED ORDER — DIPHENHYDRAMINE HCL 25 MG PO CAPS: 25.0000 mg | ORAL_CAPSULE | Freq: Once | ORAL | Status: AC

## 2020-05-16 MED ORDER — DIPHENHYDRAMINE HCL 25 MG PO CAPS
ORAL_CAPSULE | ORAL | Status: AC
  Administered 2020-05-16: 25 mg via ORAL
  Filled 2020-05-16: qty 1

## 2020-05-16 MED ORDER — CEFAZOLIN SODIUM-DEXTROSE 2-4 GM/100ML-% IV SOLN: 2.0000 g | INTRAVENOUS | Status: AC

## 2020-05-16 MED ORDER — TAMSULOSIN HCL 0.4 MG PO CAPS: 0.4000 mg | ORAL_CAPSULE | Freq: Every day | ORAL | 0 refills | Status: DC

## 2020-05-16 MED ORDER — DIAZEPAM 5 MG PO TABS: 10.0000 mg | ORAL_TABLET | ORAL | Status: AC

## 2020-05-16 MED ORDER — ONDANSETRON HCL 4 MG/2ML IJ SOLN
INTRAMUSCULAR | Status: AC
  Administered 2020-05-16: 4 mg via INTRAVENOUS
  Filled 2020-05-16: qty 2

## 2020-05-16 MED ORDER — CEFAZOLIN SODIUM-DEXTROSE 2-4 GM/100ML-% IV SOLN
INTRAVENOUS | Status: AC
  Administered 2020-05-16: 2 g via INTRAVENOUS
  Filled 2020-05-16: qty 100

## 2020-05-16 MED ORDER — SODIUM CHLORIDE 0.9 % IV SOLN: INTRAVENOUS | Status: DC

## 2020-05-16 MED ORDER — ONDANSETRON HCL 4 MG/2ML IJ SOLN: 4.0000 mg | Freq: Once | INTRAMUSCULAR | Status: AC | PRN

## 2020-05-16 MED ORDER — DIPHENHYDRAMINE HCL 25 MG PO CAPS: 25.0000 mg | ORAL_CAPSULE | ORAL | Status: AC

## 2020-05-16 MED ORDER — DIAZEPAM 5 MG PO TABS
ORAL_TABLET | ORAL | Status: AC
  Administered 2020-05-16: 10 mg via ORAL
  Filled 2020-05-16: qty 2

## 2020-05-16 NOTE — Discharge Instructions (Signed)
   As per [facility] discharge instructions  Rx tamsulosin sent to pharmacy to help passage of stone fragments

## 2020-05-16 NOTE — Interval H&P Note (Signed)
History and Physical Interval Note:  05/16/2020 9:21 AM  Stephanie Logan  has presented today for surgery, with the diagnosis of Kidney stone.  The various methods of treatment have been discussed with the patient and family. After consideration of risks, benefits and other options for treatment, the patient has consented to  Procedure(s): EXTRACORPOREAL SHOCK WAVE LITHOTRIPSY (ESWL) (Right) as a surgical intervention.  The patient's history has been reviewed, patient examined, no change in status, stable for surgery.  I have reviewed the patient's chart and labs.  Questions were answered to the patient's satisfaction.     [place]

## 2020-05-27 ENCOUNTER — Other Ambulatory Visit: Payer: Self-pay | Admitting: Urology

## 2020-05-31 ENCOUNTER — Encounter

## 2020-06-03 ENCOUNTER — Ambulatory Visit
Admission: RE | Admit: 2020-06-03 | Discharge: 2020-06-03 | Disposition: A | Discharge status: Home / Self Care | Source: Ambulatory Visit | Attending: Physician Assistant | Admitting: Physician Assistant

## 2020-06-03 ENCOUNTER — Ambulatory Visit (INDEPENDENT_AMBULATORY_CARE_PROVIDER_SITE_OTHER): Admitting: Physician Assistant

## 2020-06-03 ENCOUNTER — Encounter: Payer: Self-pay | Admitting: Physician Assistant

## 2020-06-03 ENCOUNTER — Other Ambulatory Visit: Payer: Self-pay

## 2020-06-03 ENCOUNTER — Encounter: Payer: Self-pay | Admitting: Certified Nurse Midwife

## 2020-06-03 ENCOUNTER — Other Ambulatory Visit: Payer: Self-pay | Admitting: *Deleted

## 2020-06-03 VITALS — BP 133/79 | HR 128 | Ht 62.0 in | Wt 147.0 lb

## 2020-06-03 DIAGNOSIS — N200 Calculus of kidney: Secondary | ICD-10-CM | POA: Insufficient documentation

## 2020-06-03 NOTE — Progress Notes (Signed)
06/03/2020 2:19 PM   Stephanie Logan 05-21-1991 [id number]  CC: Chief Complaint  Patient presents with  . Nephrolithiasis    HPI: Stephanie Logan is a 29 y.o. female s/p ESWL with Dr. Bernardo Heater on 05/16/2020 for management of a 4 mm right renal stone who presents today for postop follow-up.  Operative note significant for stone no longer visible following treatment.  Today she reports passing multiple punctate fragments in the 24 to 48 hours following the procedure.  She brings some with her to clinic today for analysis.  She stopped Flomax approximately 7 days following the procedure.  She reports resolution of her severe flank pain with some residual occasional right flank aches.  KUB today with resolution of the right renal stone.  PMH: Past Medical History:  Diagnosis Date  . Amenorrhea   . Anxiety   . PCOS (polycystic ovarian syndrome)   . Post partum depression     Surgical History: Past Surgical History:  Procedure Laterality Date  . BREAST FIBROADENOMA SURGERY  2008,2010  . EXTRACORPOREAL SHOCK WAVE LITHOTRIPSY Right 05/16/2020   Procedure: EXTRACORPOREAL SHOCK WAVE LITHOTRIPSY (ESWL);  Surgeon: Abbie Sons, MD;  Location: [facility] ORS;  Service: Urology;  Laterality: Right;    Home Medications:  Allergies as of 06/03/2020      Reactions   Ciprofloxacin Rash      Medication List       Accurate as of June 03, 2020  2:19 PM. If you have any questions, ask your nurse or doctor.        ALPRAZolam 0.5 MG tablet Commonly known as: Xanax Take 1 tablet (0.5 mg total) by mouth 2 (two) times daily as needed for anxiety.   ascorbic acid 500 MG tablet Commonly known as: VITAMIN C Take 500 mg by mouth daily.   cyclobenzaprine 5 MG tablet Commonly known as: FLEXERIL Take 1 tablet (5 mg total) by mouth 3 (three) times daily as needed for muscle spasms.   ELDERBERRY PO Take 1 Dose by mouth as directed.   naproxen 500 MG tablet Commonly known as:  Naprosyn Take 1 tablet (500 mg total) by mouth 2 (two) times daily with a meal.   oxyCODONE-acetaminophen 5-325 MG tablet Commonly known as: PERCOCET/ROXICET Take 1 tablet by mouth every 6 (six) hours as needed for severe pain.   predniSONE 10 MG tablet Commonly known as: DELTASONE Take 6 tablets on day 1, take 5 tablets on day 2, take 4 tablets on day 3, take 3 tablets on day 4, take 2 tablets on day 5, take 1 tablet on day 6   sertraline 100 MG tablet Commonly known as: ZOLOFT Take 1 tablet (100 mg total) by mouth at bedtime.   tamsulosin 0.4 MG Caps capsule Commonly known as: FLOMAX TAKE 1 CAPSULE(0.4 MG) BY MOUTH DAILY AFTER BREAKFAST   thiamine 100 MG tablet Commonly known as: Vitamin B-1 Take 100 mg by mouth daily.       Allergies:  Allergies  Allergen Reactions  . Ciprofloxacin Rash    Family History: Family History  Problem Relation Age of Onset  . Cancer Maternal Grandmother        brain  . Cancer Mother        cervical, precancerous  . Seizures Sister   . Lung cancer Sister   . Hashimoto's thyroiditis Brother   . Learning disabilities Son     Social History:   reports that she has never smoked. She has never used smokeless tobacco.  She reports current alcohol use. She reports that she does not use drugs.  Physical Exam: BP 133/79   Pulse (!) 128   Ht 5\' 2"  (1.575 m)   Wt 147 lb (66.7 kg)   BMI 26.89 kg/m   Constitutional:  Alert and oriented, no acute distress, nontoxic appearing HEENT: [place], AT Cardiovascular: No clubbing, cyanosis, or edema Respiratory: Normal respiratory effort, no increased work of breathing Skin: No rashes, bruises or suspicious lesions Neurologic: Grossly intact, no focal deficits, moving all 4 extremities Psychiatric: Normal mood and affect  Pertinent Imaging: KUB, 06/03/2020: CLINICAL DATA:  Kidney stone  EXAM: ABDOMEN - 1 VIEW  COMPARISON:  05/16/2020 abdominal radiographs and prior.  FINDINGS: The bowel gas  pattern is normal. No radio-opaque calculi or other significant radiographic abnormality are seen.  IMPRESSION: No evidence of nephrolithiasis.   Electronically Signed   By: Primitivo Gauze M.D.   On: 06/03/2020 11:40  I personally reviewed the images referenced above and note clearance of the previously seen 4 mm right renal stone.  Assessment & Plan:   1. Nephrolithiasis Patient has passed multiple fragments and reports resolution of severe flank pain.  KUB reassuring for retained fragment today.  We discussed general stone prevention recommendations today including decreasing dietary oxalate, increasing fluid intake, maintaining moderate calcium intake, decreasing sodium intake, and increasing dietary citrate.  Fragments deemed too small for analysis, will defer this.  Patient prefers to follow-up as needed; I am in agreement with this plan.  Return if symptoms worsen or fail to improve.  Debroah Loop, PA-C  [facility] [address], [place], [place] [address] [phone]

## 2020-06-08 ENCOUNTER — Other Ambulatory Visit: Payer: Self-pay | Admitting: Urology

## 2020-06-13 ENCOUNTER — Other Ambulatory Visit: Payer: Self-pay | Admitting: Obstetrics & Gynecology

## 2020-06-13 DIAGNOSIS — N6310 Unspecified lump in the right breast, unspecified quadrant: Secondary | ICD-10-CM

## 2020-06-20 ENCOUNTER — Ambulatory Visit
Admission: RE | Admit: 2020-06-20 | Discharge: 2020-06-20 | Disposition: A | Discharge status: Home / Self Care | Source: Ambulatory Visit | Attending: Obstetrics & Gynecology | Admitting: Obstetrics & Gynecology

## 2020-06-20 ENCOUNTER — Other Ambulatory Visit: Payer: Self-pay

## 2020-06-20 DIAGNOSIS — N6310 Unspecified lump in the right breast, unspecified quadrant: Secondary | ICD-10-CM

## 2020-06-28 ENCOUNTER — Other Ambulatory Visit: Payer: Self-pay | Admitting: Obstetrics and Gynecology

## 2020-10-14 ENCOUNTER — Other Ambulatory Visit: Payer: Self-pay | Admitting: Obstetrics and Gynecology

## 2021-04-12 ENCOUNTER — Encounter: Payer: Self-pay | Admitting: Nurse Practitioner

## 2021-04-12 DIAGNOSIS — F411 Generalized anxiety disorder: Secondary | ICD-10-CM | POA: Insufficient documentation

## 2021-04-16 ENCOUNTER — Ambulatory Visit (INDEPENDENT_AMBULATORY_CARE_PROVIDER_SITE_OTHER): Admitting: Nurse Practitioner

## 2021-04-16 ENCOUNTER — Other Ambulatory Visit: Payer: Self-pay

## 2021-04-16 ENCOUNTER — Encounter: Payer: Self-pay | Admitting: Nurse Practitioner

## 2021-04-16 VITALS — BP 91/61 | HR 90 | Temp 98.9°F | Ht 63.0 in | Wt 142.0 lb

## 2021-04-16 DIAGNOSIS — G43109 Migraine with aura, not intractable, without status migrainosus: Secondary | ICD-10-CM | POA: Diagnosis not present

## 2021-04-16 DIAGNOSIS — F332 Major depressive disorder, recurrent severe without psychotic features: Secondary | ICD-10-CM

## 2021-04-16 DIAGNOSIS — Z87898 Personal history of other specified conditions: Secondary | ICD-10-CM

## 2021-04-16 DIAGNOSIS — Z79899 Other long term (current) drug therapy: Secondary | ICD-10-CM | POA: Insufficient documentation

## 2021-04-16 DIAGNOSIS — R41840 Attention and concentration deficit: Secondary | ICD-10-CM

## 2021-04-16 DIAGNOSIS — F411 Generalized anxiety disorder: Secondary | ICD-10-CM

## 2021-04-16 DIAGNOSIS — Z7689 Persons encountering health services in other specified circumstances: Secondary | ICD-10-CM

## 2021-04-16 DIAGNOSIS — Z87442 Personal history of urinary calculi: Secondary | ICD-10-CM

## 2021-04-16 DIAGNOSIS — E538 Deficiency of other specified B group vitamins: Secondary | ICD-10-CM

## 2021-04-16 MED ORDER — RIZATRIPTAN BENZOATE 5 MG PO TABS: 5.0000 mg | ORAL_TABLET | ORAL | 4 refills | Status: DC | PRN

## 2021-04-16 MED ORDER — GABAPENTIN 100 MG PO CAPS
ORAL_CAPSULE | ORAL | 3 refills | Status: DC
Start: 2021-04-16 — End: 2021-06-23

## 2021-04-16 MED ORDER — SERTRALINE HCL 100 MG PO TABS
100.0000 mg | ORAL_TABLET | Freq: Every day | ORAL | 4 refills | Status: DC
Start: 2021-04-16 — End: 2021-11-06

## 2021-04-16 MED ORDER — MELOXICAM 7.5 MG PO TABS
7.5000 mg | ORAL_TABLET | Freq: Every day | ORAL | 2 refills | Status: DC
Start: 2021-04-16 — End: 2022-01-20

## 2021-04-16 MED ORDER — GABAPENTIN 100 MG PO CAPS: ORAL_CAPSULE | ORAL | 3 refills | Status: DC

## 2021-04-16 NOTE — Patient Instructions (Signed)
[facility]  Address: [address] [place], [place], [place] [address]  Phone: [phone]   Managing Anxiety, Adult After being diagnosed with an anxiety disorder, you may be relieved to know why you have felt or behaved a certain way. You may also feel overwhelmed about the treatment ahead and what it will mean for your life. With care and support, youcan manage this condition and recover from it. How to manage lifestyle changes Managing stress and anxiety  Stress is your body's reaction to life changes and events, both good and bad. Most stress will last just a few hours, but stress can be ongoing and can lead to more than just stress. Although stress can play a major role in anxiety, it is not the same as anxiety. Stress is usually caused by something external, such as a deadline, test, or competition. Stress normally passes after thetriggering event has ended.  Anxiety is caused by something internal, such as imagining a terrible outcome or worrying that something will go wrong that will devastate you. Anxiety often does not go away even after the triggering event is over, and it can become long-term (chronic) worry. It is important to understand the differences between stress and anxiety and to manage your stress effectively so that it does not lead to ananxious response. Talk with your health care provider or a counselor to learn more about reducing anxiety and stress. He or she may suggest tension reduction techniques, such as: Music therapy. This can include creating or listening to music that you enjoy and that inspires you. Mindfulness-based meditation. This involves being aware of your normal breaths while not trying to control your breathing. It can be done while sitting or walking. Centering prayer. This involves focusing on a word, phrase, or sacred image that means something to you and brings you peace. Deep breathing. To do this, expand your stomach  and inhale slowly through your nose. Hold your breath for 3-5 seconds. Then exhale slowly, letting your stomach muscles relax. Self-talk. This involves identifying thought patterns that lead to anxiety reactions and changing those patterns. Muscle relaxation. This involves tensing muscles and then relaxing them. Choose a tension reduction technique that suits your lifestyle and personality. These techniques take time and practice. Set aside 5-15 minutes a day to do them. Therapists can offer counseling and training in these techniques. The training to help with anxiety may be covered by some insurance plans. Other things you can do to manage stress and anxiety include: Keeping a stress/anxiety diary. This can help you learn what triggers your reaction and then learn ways to manage your response. Thinking about how you react to certain situations. You may not be able to control everything, but you can control your response. Making time for activities that help you relax and not feeling guilty about spending your time in this way. Visual imagery and yoga can help you stay calm and relax.  Medicines Medicines can help ease symptoms. Medicines for anxiety include: Anti-anxiety drugs. Antidepressants. Medicines are often used as a primary treatment for anxiety disorder. Medicines will be prescribed by a health care provider. When used together, medicines, psychotherapy, and tension reduction techniques may be the most effectivetreatment. Relationships Relationships can play a big part in helping you recover. Try to spend more time connecting with trusted friends and family members. Consider going to couples counseling, taking family education classes, or going to familytherapy. Therapy can help you and others better understand your condition. How  to recognize changes in your anxiety Everyone responds differently to treatment for anxiety. Recovery from anxiety happens when symptoms decrease and stop  interfering with your daily activities at home or work. This may mean that you will start to: Have better concentration and focus. Worry will interfere less in your daily thinking. Sleep better. Be less irritable. Have more energy. Have improved memory. It is important to recognize when your condition is getting worse. Contact your health care provider if your symptoms interfere with home or work and you feellike your condition is not improving. Follow these instructions at home: Activity Exercise. Most adults should do the following: Exercise for at least 150 minutes each week. The exercise should increase your heart rate and make you sweat (moderate-intensity exercise). Strengthening exercises at least twice a week. Get the right amount and quality of sleep. Most adults need 7-9 hours of sleep each night. Lifestyle  Eat a healthy diet that includes plenty of vegetables, fruits, whole grains, low-fat dairy products, and lean protein. Do not eat a lot of foods that are high in solid fats, added sugars, or salt. Make choices that simplify your life. Do not use any products that contain nicotine or tobacco, such as cigarettes, e-cigarettes, and chewing tobacco. If you need help quitting, ask your health care provider. Avoid caffeine, alcohol, and certain over-the-counter cold medicines. These may make you feel worse. Ask your pharmacist which medicines to avoid.  General instructions Take over-the-counter and prescription medicines only as told by your health care provider. Keep all follow-up visits as told by your health care provider. This is important. Where to find support You can get help and support from these sources: Self-help groups. Online and [employer]. A trusted spiritual leader. Couples counseling. Family education classes. Family therapy. Where to find more information You may find that joining a support group helps you deal with your anxiety. The following  sources can help you locate counselors or support groups near you: [place]: www.mentalhealth[place].net Anxiety and Depression Association of [place] (ADAA): https://www.clark.net/ National Alliance on Mental Illness (NAMI): www.nami.org Contact a health care provider if you: Have a hard time staying focused or finishing daily tasks. Spend many hours a day feeling worried about everyday life. Become exhausted by worry. Start to have headaches, feel tense, or have nausea. Urinate more than normal. Have diarrhea. Get help right away if you have: A racing heart and shortness of breath. Thoughts of hurting yourself or others. If you ever feel like you may hurt yourself or others, or have thoughts about taking your own life, get help right away. You can go to your nearest emergency department or call: Your local emergency services (911 in the [place] A suicide crisis helpline, such as the [place] at 4788552123. This is open 24 hours a day. Summary Taking steps to learn and use tension reduction techniques can help calm you and help prevent triggering an anxiety reaction. When used together, medicines, psychotherapy, and tension reduction techniques may be the most effective treatment. Family, friends, and partners can play a big part in helping you recover from an anxiety disorder. This information is not intended to replace advice given to you by your health care provider. Make sure you discuss any questions you have with your healthcare provider. Document Revised: 02/07/2019 Document Reviewed: 02/07/2019 Elsevier Patient Education  [place].

## 2021-04-16 NOTE — Assessment & Plan Note (Signed)
Chronic, ongoing.  Denies SI/HI.  At this time will continue current medication regimen to include Zoloft and Xanax, has been on for 6 years and does not want to change.  Discussed at length that she may benefit from a change to Celexa or Paxil, which may offer more anxiety benefit, but she wishes to maintain at this time.  Recommend return to therapy, even if virtual (ie. [employer]).  Refills on Zoloft sent.  Return in 2-3 weeks for physical.

## 2021-04-16 NOTE — Assessment & Plan Note (Signed)
Educated at length on risks of long term use -- refer to anxiety plan.  UDS and controlled subs contract obtained today.  Will send in refills in 2-3 weeks at physical and maintain every 3-6 month visits, dependent on use.

## 2021-04-16 NOTE — Assessment & Plan Note (Signed)
Fibroadenoma right breast -- provided her with number to schedule mammogram which is due.

## 2021-04-16 NOTE — Assessment & Plan Note (Signed)
Referral to [facility] placed for further work-up and recommendations.

## 2021-04-16 NOTE — Assessment & Plan Note (Signed)
Ongoing issue for over one year.  At this time will trial Gabapentin 100 MG to start at night for preventative, this may also benefit sleep and mood, may increase to 300 MG if tolerating.  Maxalt PRN for acute migraine.  Educated her on both of these.  Plan for return in 2-3 weeks for physical and will obtain labs.

## 2021-04-16 NOTE — Assessment & Plan Note (Signed)
Chronic, ongoing.  Denies SI/HI.  At this time will continue current medication regimen to include Zoloft and Xanax, has been on for 6 years and does not want to change.  Discussed at length that she may benefit from a change to Celexa or Paxil, which may offer more anxiety benefit, but she wishes to maintain at this time.  Recommend return to therapy, even if virtual (ie. [employer]).  Refills on Zoloft sent.  No refills on benzo today due to initial visit.  She is agreeable to yearly UDS, every 3 month visit, ans signed contract today.  Is aware of risks long term benzo use.  May benefit from changes in future. Return in 2-3 weeks for physical.

## 2021-04-16 NOTE — Assessment & Plan Note (Signed)
In 2021 with lithotripsy, monitor for recurrence and treat as needed.

## 2021-04-16 NOTE — Progress Notes (Signed)
New Patient Office Visit  Subjective:  Patient ID: Stephanie Logan, female    DOB: 03-15-1991  Age: 30 y.o. MRN: [id number]  CC:  Chief Complaint  Patient presents with   Establish Care    Patient states she is here to establish care as she needs refills on Anxiety/Depression. Patient states she does not think he Anxiety is not manage as well as it could be. Patient states she thinks it may be some ADHD involved.    Depression   Anxiety   Headache    Patient states she has been dealing with headaches since last year and they have gradually getting worse. Patient states currently Tylenol or Excedrin and sometimes it helps but not all the time.    Medication Refill    Patient requesting refills on Meloxicam and Anxiety/Depression medications.     HPI Stephanie Logan presents for new patient visit to establish care.  Introduced to Designer, jewellery role and practice setting.  All questions answered.  Discussed provider/patient relationship and expectations.  Was being followed by OB -- Dr. Leonides Schanz -- for primary care for past years, she is no longer practicing.  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  History of kidney stones with lithotripsy in 2021.  Review previous labs -- B12 level on lower side at 284 in 2021.  ANXIETY/STRESS Currently taking Zoloft 100 MG daily and Xanax as needed 0.5 MG PRN.  Her Xanax she uses about 1-3 times a week.  Has been on this regimen for 6 years started by OB/GYN.  Has struggled with anxiety/depression her whole life, this worsened after 2nd child was born.  Her twin sister also has anxiety/depression -- she is unsure what medicines she takes at this time.  Her husband is double amputee, lost legs in [place].  Has history of stillborn child.    Pt is aware of risks of benzo medication use to include increased sedation, respiratory suppression, falls,  dependence and cardiovascular events.  Pt would like to continue treatment as benefit determined to outweigh risk.     Has attended therapy in past, did find helpful. Duration:stable Anxious mood: yes  Excessive worrying: yes Irritability: yes  Sweating: no Nausea:  with anxiety Palpitations: occasional Hyperventilation:  occasional Panic attacks: yes - once a week or every other week Agoraphobia: no  Obscessions/compulsions: yes -- future plans, house Depressed mood:  sometimes -- more the week before cycle Depression screen [facility] 2/9 04/16/2021 05/31/2019 10/12/2018 05/26/2018 06/17/2017  Decreased Interest 1 2 0 3 1  Down, Depressed, Hopeless '1 2 1 3 1  '$ PHQ - 2 Score '2 4 1 6 2  '$ Altered sleeping '2 2 1 3 1  '$ Tired, decreased energy '3 2 1 3 2  '$ Change in appetite '3 2 1 2 1  '$ Feeling bad or failure about yourself  2 0 0 2 0  Trouble concentrating '3 3 1 3 1  '$ Moving slowly or fidgety/restless 1 0 0 0 0  Suicidal thoughts 0 0 0 0 0  PHQ-9 Score '16 13 5 19 7  '$ Difficult doing work/chores Very difficult Very difficult Not difficult at all Very difficult Somewhat difficult   Anhedonia: no Weight changes: no Insomnia: yes hard to stay asleep  Hypersomnia: no Fatigue/loss of energy: yes Feelings of worthlessness: yes Feelings of guilt: yes Impaired concentration/indecisiveness: yes Suicidal ideations: no  Crying spells: yes Recent Stressors/Life Changes: yes  Relationship problems: no   Family stress: yes  -- poor relationship with mother   Financial stress: no    Job stress:  about to start work again     Recent death/loss: no  GAD 7 : Generalized Anxiety Score 04/16/2021  Nervous, Anxious, on Edge 3  Control/stop worrying 3  Worry too much - different things 3  Trouble relaxing 3  Restless 2  Easily annoyed or irritable 3  Afraid - awful might happen 2  Total GAD 7 Score 19  Anxiety Difficulty Extremely difficult    MIGRAINES Has been ongoing issue for one year, used to only get  these when pregnant.  Now getting once a week or once every other week. Duration: years Onset: gradual Severity: 10/10 at worst -- majority are 5-6/10 Quality: dull, aching, and throbbing Frequency: intermittent Location: to left side behind eye Headache duration: 24 to 48 hours Radiation: no Time of day headache occurs: varies Alleviating factors: ice and heat on neck Aggravating factors: lack of sleep Headache status at time of visit: asymptomatic Treatments attempted: Treatments attempted: ice, heat, APAP, and ibuprofen   Aura: yes Nausea:  yes Vomiting: no Photophobia:  yes Phonophobia:  yes Effect on social functioning:  yes Numbers of missed days of school/work each month: none Confusion:  no Gait disturbance/ataxia:  no Behavioral changes:  no Fevers:  no   Past Medical History:  Diagnosis Date   Amenorrhea    Anxiety    PCOS (polycystic ovarian syndrome)    Post partum depression     Past Surgical History:  Procedure Laterality Date   BREAST EXCISIONAL BIOPSY Right 2018   fibroadenoma   BREAST EXCISIONAL BIOPSY Left 2010   fibroadenoma   BREAST FIBROADENOMA SURGERY  2008,2010   EXTRACORPOREAL SHOCK WAVE LITHOTRIPSY Right 05/16/2020   Procedure: EXTRACORPOREAL SHOCK WAVE LITHOTRIPSY (ESWL);  Surgeon: Abbie Sons, MD;  Location: [facility] ORS;  Service: Urology;  Laterality: Right;    Family History  Problem Relation Age of Onset   Cancer Mother        cervical, precancerous   Cancer Father        Colon, Precancerous   Seizures Sister    Lung cancer Sister    Heart Problems Sister    Hashimoto's thyroiditis Brother    Learning disabilities Son    Cancer Maternal Grandmother        brain   Healthy Maternal Grandfather    Healthy Paternal Grandmother    Aneurysm Paternal Grandfather        Brain   Breast cancer Neg Hx     Social History   Socioeconomic History   Marital status: Married    Spouse name: Donna Christen   Number of children: 3   Years of  education: Not on file   Highest education level: Not on file  Occupational History   Not on file  Tobacco Use   Smoking status: Never   Smokeless tobacco: Never  Vaping Use   Vaping Use: Never used  Substance and Sexual Activity   Alcohol use: Yes    Comment: occas   Drug use: No   Sexual activity: Yes    Birth control/protection: Condom    Comment: undecided  Other Topics Concern   Not on file  Social History Narrative   All boys at home -- age 47, 57, 2.  Is going back at [employer].   Social Determinants of Health   Financial Resource Strain: Low Risk    Difficulty  of Paying Living Expenses: Not hard at all  Food Insecurity: No Food Insecurity   Worried About [place] in the Last Year: Never true   [place] in the Last Year: Never true  Transportation Needs: No Transportation Needs   Lack of Transportation (Medical): No   Lack of Transportation (Non-Medical): No  Physical Activity: Sufficiently Active   Days of Exercise per Week: 5 days   Minutes of Exercise per Session: 40 min  Stress: Stress Concern Present   Feeling of Stress : To some extent  Social Connections: Moderately Isolated   Frequency of Communication with Friends and Family: More than three times a week   Frequency of Social Gatherings with Friends and Family: More than three times a week   Attends Religious Services: Never   Marine scientist or Organizations: No   Attends Music therapist: Never   Marital Status: Married  Human resources officer Violence: Not At Risk   Fear of Current or Ex-Partner: No   Emotionally Abused: No   Physically Abused: No   Sexually Abused: No    ROS Review of Systems  Constitutional:  Negative for activity change, appetite change, diaphoresis, fatigue and fever.  Respiratory:  Negative for cough, chest tightness and shortness of breath.   Cardiovascular:  Negative for chest pain, palpitations and leg swelling.  Gastrointestinal: Negative.    Endocrine: Negative.   Neurological:  Positive for headaches. Negative for dizziness, syncope, weakness, light-headedness and numbness.  Psychiatric/Behavioral:  Positive for decreased concentration and sleep disturbance. Negative for self-injury and suicidal ideas. The patient is nervous/anxious.    Objective:   Today's Vitals: BP 91/61   Pulse 90   Temp 98.9 F (37.2 C) (Oral)   Ht '5\' 3"'$  (1.6 m)   Wt 142 lb (64.4 kg)   SpO2 97%   BMI 25.15 kg/m   Physical Exam Vitals and nursing note reviewed.  Constitutional:      General: She is awake. She is not in acute distress.    Appearance: She is well-developed and well-groomed. She is not ill-appearing or toxic-appearing.  HENT:     Head: Normocephalic.     Right Ear: Hearing normal.     Left Ear: Hearing normal.  Eyes:     General: Lids are normal.        Right eye: No discharge.        Left eye: No discharge.     Conjunctiva/sclera: Conjunctivae normal.     Pupils: Pupils are equal, round, and reactive to light.  Neck:     Thyroid: No thyromegaly.     Vascular: No carotid bruit.  Cardiovascular:     Rate and Rhythm: Normal rate and regular rhythm.     Heart sounds: Normal heart sounds. No murmur heard.   No gallop.  Pulmonary:     Effort: Pulmonary effort is normal. No accessory muscle usage or respiratory distress.     Breath sounds: Normal breath sounds.  Abdominal:     General: Bowel sounds are normal.     Palpations: Abdomen is soft. There is no hepatomegaly or splenomegaly.  Musculoskeletal:     Cervical back: Normal range of motion and neck supple.     Right lower leg: No edema.     Left lower leg: No edema.  Lymphadenopathy:     Cervical: No cervical adenopathy.  Skin:    General: Skin is warm and dry.  Neurological:     Mental  Status: She is alert and oriented to person, place, and time.     Cranial Nerves: Cranial nerves are intact.     Motor: Motor function is intact.     Coordination: Coordination is  intact.     Gait: Gait is intact.     Deep Tendon Reflexes: Reflexes are normal and symmetric.     Reflex Scores:      Brachioradialis reflexes are 2+ on the right side and 2+ on the left side.      Patellar reflexes are 2+ on the right side and 2+ on the left side. Psychiatric:        Attention and Perception: Attention normal.        Mood and Affect: Mood normal.        Speech: Speech normal.        Behavior: Behavior normal. Behavior is cooperative.        Thought Content: Thought content normal.    Assessment & Plan:   Problem List Items Addressed This Visit       Cardiovascular and Mediastinum   Migraine with aura    Ongoing issue for over one year.  At this time will trial Gabapentin 100 MG to start at night for preventative, this may also benefit sleep and mood, may increase to 300 MG if tolerating.  Maxalt PRN for acute migraine.  Educated her on both of these.  Plan for return in 2-3 weeks for physical and will obtain labs.       Relevant Medications   rizatriptan (MAXALT) 5 MG tablet   gabapentin (NEURONTIN) 100 MG capsule   sertraline (ZOLOFT) 100 MG tablet   meloxicam (MOBIC) 7.5 MG tablet     Other   Severe episode of recurrent major depressive disorder, without psychotic features (HCC)    Chronic, ongoing.  Denies SI/HI.  At this time will continue current medication regimen to include Zoloft and Xanax, has been on for 6 years and does not want to change.  Discussed at length that she may benefit from a change to Celexa or Paxil, which may offer more anxiety benefit, but she wishes to maintain at this time.  Recommend return to therapy, even if virtual (ie. [facility]).  Refills on Zoloft sent.  Return in 2-3 weeks for physical.       Relevant Medications   sertraline (ZOLOFT) 100 MG tablet   Other Relevant Orders   [id number] 11+Oxyco+Alc+Crt-Bund   Ambulatory referral to Psychiatry   Vitamin B 12 deficiency    Noted on past labs at 284, will recheck in 2-3 weeks  at physical.  Is remains lower side then start supplement.       Generalized anxiety disorder    Chronic, ongoing.  Denies SI/HI.  At this time will continue current medication regimen to include Zoloft and Xanax, has been on for 6 years and does not want to change.  Discussed at length that she may benefit from a change to Celexa or Paxil, which may offer more anxiety benefit, but she wishes to maintain at this time.  Recommend return to therapy, even if virtual (ie. [facility]).  Refills on Zoloft sent.  No refills on benzo today due to initial visit.  She is agreeable to yearly UDS, every 3 month visit, ans signed contract today.  Is aware of risks long term benzo use.  May benefit from changes in future. Return in 2-3 weeks for physical.       Relevant Medications   sertraline (  ZOLOFT) 100 MG tablet   Other Relevant Orders   [id number] 11+Oxyco+Alc+Crt-Bund   Ambulatory referral to Psychiatry   Controlled substance agreement signed    On long term benzo -- signed today 04/16/21.       Long term prescription benzodiazepine use    Educated at length on risks of long term use -- refer to anxiety plan.  UDS and controlled subs contract obtained today.  Will send in refills in 2-3 weeks at physical and maintain every 3-6 month visits, dependent on use.       Relevant Orders   [id number] 11+Oxyco+Alc+Crt-Bund   Lack of concentration    Referral to [facility] placed for further work-up and recommendations.       Relevant Orders   Ambulatory referral to Psychiatry   History of kidney stones    In 2021 with lithotripsy, monitor for recurrence and treat as needed.       History of abnormal mammogram    Fibroadenoma right breast -- provided her with number to schedule mammogram which is due.       Other Visit Diagnoses     Encounter to establish care    -  Primary       Outpatient Encounter Medications as of 04/16/2021  Medication Sig   ALPRAZolam (XANAX) 0.5 MG  tablet Take 1 tablet (0.5 mg total) by mouth 2 (two) times daily as needed for anxiety.   ascorbic acid (VITAMIN C) 500 MG tablet Take 500 mg by mouth daily.   rizatriptan (MAXALT) 5 MG tablet Take 1 tablet (5 mg total) by mouth as needed for migraine. May repeat in 2 hours if needed   thiamine (VITAMIN B-1) 100 MG tablet Take 100 mg by mouth daily.   [DISCONTINUED] gabapentin (NEURONTIN) 100 MG capsule Start out with one capsule (100 MG) at night by mouth and if tolerating may increase to 3 capsules (300 MG) at night for migraine prevention.   [DISCONTINUED] meloxicam (MOBIC) 7.5 MG tablet Take 7.5 mg by mouth daily.   [DISCONTINUED] sertraline (ZOLOFT) 100 MG tablet Take 1 tablet (100 mg total) by mouth at bedtime.   gabapentin (NEURONTIN) 100 MG capsule Start out with one capsule (100 MG) at night by mouth and if tolerating may increase to 3 capsules (300 MG) at night for migraine prevention.   meloxicam (MOBIC) 7.5 MG tablet Take 1 tablet (7.5 mg total) by mouth daily.   sertraline (ZOLOFT) 100 MG tablet Take 1 tablet (100 mg total) by mouth at bedtime.   [DISCONTINUED] cyclobenzaprine (FLEXERIL) 5 MG tablet Take 1 tablet (5 mg total) by mouth 3 (three) times daily as needed for muscle spasms. (Patient not taking: Reported on 04/16/2021)   [DISCONTINUED] ELDERBERRY PO Take 1 Dose by mouth as directed. (Patient not taking: Reported on 04/16/2021)   [DISCONTINUED] oxyCODONE-acetaminophen (PERCOCET/ROXICET) 5-325 MG tablet Take 1 tablet by mouth every 6 (six) hours as needed for severe pain. (Patient not taking: Reported on 04/16/2021)   [DISCONTINUED] predniSONE (DELTASONE) 10 MG tablet Take 6 tablets on day 1, take 5 tablets on day 2, take 4 tablets on day 3, take 3 tablets on day 4, take 2 tablets on day 5, take 1 tablet on day 6 (Patient not taking: Reported on 04/16/2021)   [DISCONTINUED] sertraline (ZOLOFT) 50 MG tablet Take 50 mg by mouth 2 (two) times daily.   [DISCONTINUED] tamsulosin (FLOMAX)  0.4 MG CAPS capsule TAKE 1 CAPSULE(0.4 MG) BY MOUTH DAILY AFTER BREAKFAST (Patient not taking: Reported on  04/16/2021)   No facility-administered encounter medications on file as of 04/16/2021.    Follow-up: Return in about 2 weeks (around 04/30/2021) for Annual physical.   Venita Lick, NP

## 2021-04-16 NOTE — Assessment & Plan Note (Signed)
Noted on past labs at 284, will recheck in 2-3 weeks at physical.  Is remains lower side then start supplement.

## 2021-04-16 NOTE — Assessment & Plan Note (Signed)
On long term benzo -- signed today 04/16/21.

## 2021-04-17 LAB — DRUG SCREEN 764883 11+OXYCO+ALC+CRT-BUND
Amphetamines, Urine: NEGATIVE ng/mL
BENZODIAZ UR QL: NEGATIVE ng/mL
Barbiturate: NEGATIVE ng/mL
Cannabinoid Quant, Ur: NEGATIVE ng/mL
Cocaine (Metabolite): NEGATIVE ng/mL
Creatinine: 81.9 mg/dL (ref 20.0–300.0)
Ethanol: NEGATIVE %
Meperidine: NEGATIVE ng/mL
Methadone Screen, Urine: NEGATIVE ng/mL
OPIATE SCREEN URINE: NEGATIVE ng/mL
Oxycodone/Oxymorphone, Urine: NEGATIVE ng/mL
Phencyclidine: NEGATIVE ng/mL
Propoxyphene: NEGATIVE ng/mL
Tramadol: NEGATIVE ng/mL
pH, Urine: 7.3 (ref 4.5–8.9)

## 2021-05-14 ENCOUNTER — Encounter: Payer: Self-pay | Admitting: Nurse Practitioner

## 2021-06-23 ENCOUNTER — Other Ambulatory Visit: Payer: Self-pay

## 2021-06-23 ENCOUNTER — Encounter: Payer: Self-pay | Admitting: Nurse Practitioner

## 2021-06-23 ENCOUNTER — Ambulatory Visit (INDEPENDENT_AMBULATORY_CARE_PROVIDER_SITE_OTHER): Admitting: Nurse Practitioner

## 2021-06-23 VITALS — BP 88/54 | HR 80 | Temp 99.2°F | Ht 63.5 in | Wt 144.0 lb

## 2021-06-23 DIAGNOSIS — Z87898 Personal history of other specified conditions: Secondary | ICD-10-CM

## 2021-06-23 DIAGNOSIS — Z0000 Encounter for general adult medical examination without abnormal findings: Secondary | ICD-10-CM

## 2021-06-23 DIAGNOSIS — F411 Generalized anxiety disorder: Secondary | ICD-10-CM | POA: Diagnosis not present

## 2021-06-23 DIAGNOSIS — F332 Major depressive disorder, recurrent severe without psychotic features: Secondary | ICD-10-CM

## 2021-06-23 DIAGNOSIS — G43109 Migraine with aura, not intractable, without status migrainosus: Secondary | ICD-10-CM

## 2021-06-23 DIAGNOSIS — E538 Deficiency of other specified B group vitamins: Secondary | ICD-10-CM

## 2021-06-23 DIAGNOSIS — R41840 Attention and concentration deficit: Secondary | ICD-10-CM

## 2021-06-23 DIAGNOSIS — Z8249 Family history of ischemic heart disease and other diseases of the circulatory system: Secondary | ICD-10-CM

## 2021-06-23 DIAGNOSIS — Z79899 Other long term (current) drug therapy: Secondary | ICD-10-CM

## 2021-06-23 MED ORDER — ALPRAZOLAM 0.5 MG PO TABS: 0.5000 mg | ORAL_TABLET | Freq: Two times a day (BID) | ORAL | 2 refills | Status: DC | PRN

## 2021-06-23 NOTE — Assessment & Plan Note (Signed)
Referral to psychiatry placed last visit for further testing, she reports they did call but she forgot to call back, recommend she call to schedule and if new referral needed will place.

## 2021-06-23 NOTE — Assessment & Plan Note (Signed)
Repeat ultrasound as recommended ordered today and provided number for her to call and schedule at [facility].

## 2021-06-23 NOTE — Assessment & Plan Note (Signed)
Chronic, ongoing.  Denies SI/HI.  At this time will continue current medication regimen to include Zoloft and Xanax, has been on for 6 years and does not want to change.  Discussed at length that she may benefit from a change to Celexa or Paxil, which may offer more anxiety benefit, but she wishes to maintain at this time.  Recommend return to therapy, even if virtual (ie. [facility]).  Return in 3 months.

## 2021-06-23 NOTE — Assessment & Plan Note (Signed)
Educated at length on risks of long term use -- refer to anxiety plan.  UDS and controlled subs contract up to date July 2022.

## 2021-06-23 NOTE — Progress Notes (Signed)
BP (!) 88/54   Pulse 80   Temp 99.2 F (37.3 C) (Oral)   Ht 5' 3.5" (1.613 m)   Wt 144 lb (65.3 kg)   LMP 06/22/2021 (Approximate)   SpO2 98%   BMI 25.11 kg/m    Subjective:    Patient ID: Stephanie Logan, female    DOB: [birth year], 30 y.o.   MRN: [id number]  HPI: Stephanie Logan is a 30 y.o. female presenting on 06/23/2021 for comprehensive medical examination. Current medical complaints include:none  She currently lives with: husband Menopausal Symptoms: no  MIGRAINES Started Gabapentin at recent visit due to increased migraines and Maxalt.  Never started Gabapentin, has not needed.  Has only had 2 migraines since visit at end of July.  Has not had to use Maxalt either. Duration: years Onset: gradual Severity: 6/10 Quality: dull, aching, and throbbing Frequency: intermittent Location: to left side behind eye Headache duration: 24 to 48 hours Radiation: no Time of day headache occurs: varies Alleviating factors: ice and heat on neck Aggravating factors: lack of sleep Headache status at time of visit: asymptomatic Treatments attempted: Treatments attempted: ice, heat, APAP, and ibuprofen   Aura: yes Nausea:  yes Vomiting: no Photophobia:  yes Phonophobia:  yes Effect on social functioning:  yes Numbers of missed days of school/work each month: none Confusion:  no Gait disturbance/ataxia:  no Behavioral changes:  no Fevers:  no    ANXIETY/STRESS Currently taking Zoloft 100 MG daily and Xanax as needed 0.5 MG PRN.  Xanax she uses about 1-3 times a week.  Has been on this regimen for 6 + years started by OB/GYN.  Has struggled with anxiety/depression her whole life, this worsened after 2nd child was born.  Her husband is double amputee, lost legs in [place].  Has history of stillborn child.  A referral to psychiatry was placed on 04/16/21 for further evaluation and assessment for ADD.   Pt is aware of risks of benzo medication use to include increased sedation,  respiratory suppression, falls, dependence and cardiovascular events.  Pt would like to continue treatment as benefit determined to outweigh risk.      Has attended therapy in past, did find helpful. Duration:stable Anxious mood: baseline Excessive worrying: baseline Irritability: yes  Sweating: no Nausea:  with anxiety Palpitations: occasional Hyperventilation:  occasional Panic attacks: not within the past 2 weeks Agoraphobia: no  Obscessions/compulsions: yes -- future plans, house Depressed mood: occasional, not so much lately Depression screen [facility] 2/9 06/23/2021 04/16/2021 05/31/2019 10/12/2018 05/26/2018  Decreased Interest 1 1 2  0 3  Down, Depressed, Hopeless 1 1 2 1 3   PHQ - 2 Score 2 2 4 1 6   Altered sleeping 2 2 2 1 3   Tired, decreased energy 3 3 2 1 3   Change in appetite 0 3 2 1 2   Feeling bad or failure about yourself  1 2 0 0 2  Trouble concentrating 3 3 3 1 3   Moving slowly or fidgety/restless 0 1 0 0 0  Suicidal thoughts 0 0 0 0 0  PHQ-9 Score 11 16 13 5 19   Difficult doing work/chores Somewhat difficult Very difficult Very difficult Not difficult at all Very difficult  Some recent data might be hidden  Anhedonia: no Weight changes: no Insomnia: yes hard to fall asleep  Hypersomnia: no Fatigue/loss of energy: yes Feelings of worthlessness: yes Feelings of guilt: yes Impaired concentration/indecisiveness: yes Suicidal ideations: no  Crying spells: yes Recent Stressors/Life Changes: yes   Relationship problems:  no   Family stress: yes  -- poor relationship with mother   Financial stress: no    Job stress: none   Recent death/loss: no  GAD 7 : Generalized Anxiety Score 06/23/2021 04/16/2021  Nervous, Anxious, on Edge 1 3  Control/stop worrying 1 3  Worry too much - different things 2 3  Trouble relaxing 3 3  Restless 1 2  Easily annoyed or irritable 3 3  Afraid - awful might happen 0 2  Total GAD 7 Score 11 19  Anxiety Difficulty Somewhat difficult Extremely  difficult     The patient does not have a history of falls. I did not complete a risk assessment for falls. A plan of care for falls was not documented.   Past Medical History:  Past Medical History:  Diagnosis Date   Amenorrhea    Anxiety    PCOS (polycystic ovarian syndrome)    Post partum depression     Surgical History:  Past Surgical History:  Procedure Laterality Date   BREAST EXCISIONAL BIOPSY Right 2018   fibroadenoma   BREAST EXCISIONAL BIOPSY Left 2010   fibroadenoma   BREAST FIBROADENOMA SURGERY  2008,2010   EXTRACORPOREAL SHOCK WAVE LITHOTRIPSY Right 05/16/2020   Procedure: EXTRACORPOREAL SHOCK WAVE LITHOTRIPSY (ESWL);  Surgeon: Abbie Sons, MD;  Location: [facility] ORS;  Service: Urology;  Laterality: Right;    Medications:  Current Outpatient Medications on File Prior to Visit  Medication Sig   ascorbic acid (VITAMIN C) 500 MG tablet Take 500 mg by mouth daily.   meloxicam (MOBIC) 7.5 MG tablet Take 1 tablet (7.5 mg total) by mouth daily.   rizatriptan (MAXALT) 5 MG tablet Take 1 tablet (5 mg total) by mouth as needed for migraine. May repeat in 2 hours if needed   sertraline (ZOLOFT) 100 MG tablet Take 1 tablet (100 mg total) by mouth at bedtime.   thiamine (VITAMIN B-1) 100 MG tablet Take 100 mg by mouth daily.   No current facility-administered medications on file prior to visit.    Allergies:  Allergies  Allergen Reactions   Mucinex Dm [Dm-Guaifenesin Er] Hives and Swelling   Ciprofloxacin Rash    Social History:  Social History   Socioeconomic History   Marital status: Married    Spouse name: Donna Christen   Number of children: 3   Years of education: Not on file   Highest education level: Not on file  Occupational History   Not on file  Tobacco Use   Smoking status: Never   Smokeless tobacco: Never  Vaping Use   Vaping Use: Never used  Substance and Sexual Activity   Alcohol use: Yes    Comment: occas   Drug use: No   Sexual activity: Yes     Birth control/protection: Condom    Comment: undecided  Other Topics Concern   Not on file  Social History Narrative   All boys at home -- age 67, 62, 2.  Is going back at [employer] [facility].   Social Determinants of Health   Financial Resource Strain: Low Risk    Difficulty of Paying Living Expenses: Not hard at all  Food Insecurity: No Food Insecurity   Worried About Charity fundraiser in the Last Year: Never true   [place] in the Last Year: Never true  Transportation Needs: No Transportation Needs   Lack of Transportation (Medical): No   Lack of Transportation (Non-Medical): No  Physical Activity: Sufficiently Active   Days of Exercise per  Week: 5 days   Minutes of Exercise per Session: 40 min  Stress: Stress Concern Present   Feeling of Stress : To some extent  Social Connections: Moderately Isolated   Frequency of Communication with Friends and Family: More than three times a week   Frequency of Social Gatherings with Friends and Family: More than three times a week   Attends Religious Services: Never   Marine scientist or Organizations: No   Attends Music therapist: Never   Marital Status: Married  Human resources officer Violence: Not At Risk   Fear of Current or Ex-Partner: No   Emotionally Abused: No   Physically Abused: No   Sexually Abused: No   Social History   Tobacco Use  Smoking Status Never  Smokeless Tobacco Never   Social History   Substance and Sexual Activity  Alcohol Use Yes   Comment: occas    Family History:  Family History  Problem Relation Age of Onset   Cancer Mother        cervical, precancerous   Cancer Father        Colon, Precancerous   Seizures Sister    Lung cancer Sister    Heart Problems Sister    Hashimoto's thyroiditis Brother    Learning disabilities Son    Cancer Maternal Grandmother        brain   Healthy Maternal Grandfather    Healthy Paternal Grandmother    Aneurysm Paternal Grandfather        Brain    Breast cancer Neg Hx     Past medical history, surgical history, medications, allergies, family history and social history reviewed with patient today and changes made to appropriate areas of the chart.   ROS All other ROS negative except what is listed above and in the HPI.      Objective:    BP (!) 88/54   Pulse 80   Temp 99.2 F (37.3 C) (Oral)   Ht 5' 3.5" (1.613 m)   Wt 144 lb (65.3 kg)   LMP 06/22/2021 (Approximate)   SpO2 98%   BMI 25.11 kg/m   Wt Readings from Last 3 Encounters:  06/23/21 144 lb (65.3 kg)  04/16/21 142 lb (64.4 kg)  06/03/20 147 lb (66.7 kg)    Physical Exam Vitals and nursing note reviewed. Exam conducted with a chaperone present.  Constitutional:      General: She is awake. She is not in acute distress.    Appearance: She is well-developed. She is not ill-appearing.  HENT:     Head: Normocephalic and atraumatic.     Right Ear: Hearing, tympanic membrane, ear canal and external ear normal. No drainage.     Left Ear: Hearing, tympanic membrane, ear canal and external ear normal. No drainage.     Nose: Nose normal.     Right Sinus: No maxillary sinus tenderness or frontal sinus tenderness.     Left Sinus: No maxillary sinus tenderness or frontal sinus tenderness.     Mouth/Throat:     Mouth: Mucous membranes are moist.     Pharynx: Oropharynx is clear. Uvula midline. No pharyngeal swelling, oropharyngeal exudate or posterior oropharyngeal erythema.  Eyes:     General: Lids are normal.        Right eye: No discharge.        Left eye: No discharge.     Extraocular Movements: Extraocular movements intact.     Conjunctiva/sclera: Conjunctivae normal.     Pupils:  Pupils are equal, round, and reactive to light.     Visual Fields: Right eye visual fields normal and left eye visual fields normal.  Neck:     Thyroid: No thyromegaly.     Vascular: No carotid bruit.     Trachea: Trachea normal.  Cardiovascular:     Rate and Rhythm: Normal rate and  regular rhythm.     Heart sounds: Normal heart sounds. No murmur heard.   No gallop.  Pulmonary:     Effort: Pulmonary effort is normal. No accessory muscle usage or respiratory distress.     Breath sounds: Normal breath sounds.  Chest:  Breasts:    Right: Mass present.     Left: Normal.    Abdominal:     General: Bowel sounds are normal.     Palpations: Abdomen is soft. There is no hepatomegaly or splenomegaly.     Tenderness: There is no abdominal tenderness.  Musculoskeletal:        General: Normal range of motion.     Cervical back: Normal range of motion and neck supple.     Right lower leg: No edema.     Left lower leg: No edema.  Lymphadenopathy:     Head:     Right side of head: No submental, submandibular, tonsillar, preauricular or posterior auricular adenopathy.     Left side of head: No submental, submandibular, tonsillar, preauricular or posterior auricular adenopathy.     Cervical: No cervical adenopathy.     Upper Body:     Right upper body: No supraclavicular, axillary or pectoral adenopathy.     Left upper body: No supraclavicular, axillary or pectoral adenopathy.  Skin:    General: Skin is warm and dry.     Capillary Refill: Capillary refill takes less than 2 seconds.     Findings: No rash.  Neurological:     Mental Status: She is alert and oriented to person, place, and time.     Cranial Nerves: Cranial nerves are intact.     Gait: Gait is intact.     Deep Tendon Reflexes: Reflexes are normal and symmetric.     Reflex Scores:      Brachioradialis reflexes are 2+ on the right side and 2+ on the left side.      Patellar reflexes are 2+ on the right side and 2+ on the left side. Psychiatric:        Attention and Perception: Attention normal.        Mood and Affect: Mood normal.        Speech: Speech normal.        Behavior: Behavior normal. Behavior is cooperative.        Thought Content: Thought content normal.        Judgment: Judgment normal.     Results for orders placed or performed in visit on 04/16/21  [id number] 11+Oxyco+Alc+Crt-Bund  Result Value Ref Range   Ethanol Negative Cutoff=0.020 %   Amphetamines, Urine Negative Cutoff=1000 ng/mL   Barbiturate Negative Cutoff=200 ng/mL   BENZODIAZ UR QL Negative Cutoff=200 ng/mL   Cannabinoid Quant, Ur Negative Cutoff=50 ng/mL   Cocaine (Metabolite) Negative Cutoff=300 ng/mL   OPIATE SCREEN URINE Negative Cutoff=300 ng/mL   Oxycodone/Oxymorphone, Urine Negative Cutoff=300 ng/mL   Phencyclidine Negative Cutoff=25 ng/mL   Methadone Screen, Urine Negative Cutoff=300 ng/mL   Propoxyphene Negative Cutoff=300 ng/mL   Meperidine Negative Cutoff=200 ng/mL   Tramadol Negative Cutoff=200 ng/mL   Creatinine 81.9 20.0 - 300.0 mg/dL  pH, Urine 7.3 4.5 - 8.9      Assessment & Plan:   Problem List Items Addressed This Visit       Cardiovascular and Mediastinum   Migraine with aura    Chronic, stable -- never took Gabapentin or Maxalt.  At this time continue current OTC medications at home and adjust as needed.  Will restart preventative in future if needed. Labs today.      Relevant Orders   CBC with Differential/Platelet   Comprehensive metabolic panel     Other   Severe episode of recurrent major depressive disorder, without psychotic features ([place]) - Primary    Chronic, ongoing.  Denies SI/HI.  At this time will continue current medication regimen to include Zoloft and Xanax, has been on for 6 years and does not want to change.  Discussed at length that she may benefit from a change to Celexa or Paxil, which may offer more anxiety benefit, but she wishes to maintain at this time.  Recommend return to therapy, even if virtual (ie. [facility]).  Return in 3 months.      Relevant Medications   ALPRAZolam (XANAX) 0.5 MG tablet   Vitamin B 12 deficiency    Noted on past labs at 284, recheck at physical today.  If remains lower side then start supplement.      Relevant Orders    Vitamin B12   Generalized anxiety disorder    Chronic, ongoing.  Denies SI/HI.  At this time will continue current medication regimen to include Zoloft and Xanax, has been on for 6 years and does not want to change.  Discussed at length that she may benefit from a change to Celexa or Paxil, which may offer more anxiety benefit, but she wishes to maintain at this time.  Recommend return to therapy, even if virtual (ie. [facility]).  Refills on Xanax sent.  She is agreeable to yearly UDS (done July 2022), every 3 month visit, and signed contract last visit.  Is aware of risks long term benzo use.  May benefit from changes in future. Return in 3 months.      Relevant Medications   ALPRAZolam (XANAX) 0.5 MG tablet   Other Relevant Orders   TSH   Long term prescription benzodiazepine use    Educated at length on risks of long term use -- refer to anxiety plan.  UDS and controlled subs contract up to date July 2022.      Lack of concentration    Referral to psychiatry placed last visit for further testing, she reports they did call but she forgot to call back, recommend she call to schedule and if new referral needed will place.      History of abnormal mammogram    Repeat ultrasound as recommended ordered today and provided number for her to call and schedule at [facility].      Relevant Orders   US BREAST LTD UNI RIGHT INC AXILLA   Other Visit Diagnoses     Family history of cardiac disorder       Lipid panel today.   Relevant Orders   Lipid Panel w/o Chol/HDL Ratio   Encounter for annual physical exam       Annual physical with labs today and health maintenance reviewed -- refuses flu vaccination.        Follow up plan: Return in about 3 months (around 09/23/2021) for MOOD.   LABORATORY TESTING:  - Pap smear: up to date  IMMUNIZATIONS:   -  Tdap: Tetanus vaccination status reviewed: last tetanus booster within 10 years. - Influenza: Refused - Pneumovax: Not applicable - Prevnar:  Not applicable - HPV: Not applicable - Zostavax vaccine: Not applicable  SCREENING: -Mammogram: Ordered today  - Colonoscopy: Not applicable  - Bone Density: Not applicable  -Hearing Test: Not applicable  -Spirometry: Not applicable   PATIENT COUNSELING:   Advised to take 1 mg of folate supplement per day if capable of pregnancy.   Sexuality: Discussed sexually transmitted diseases, partner selection, use of condoms, avoidance of unintended pregnancy  and contraceptive alternatives.   Advised to avoid cigarette smoking.  I discussed with the patient that most people either abstain from alcohol or drink within safe limits (<=14/week and <=4 drinks/occasion for males, <=7/weeks and <= 3 drinks/occasion for females) and that the risk for alcohol disorders and other health effects rises proportionally with the number of drinks per week and how often a drinker exceeds daily limits.  Discussed cessation/primary prevention of drug use and availability of treatment for abuse.   Diet: Encouraged to adjust caloric intake to maintain  or achieve ideal body weight, to reduce intake of dietary saturated fat and total fat, to limit sodium intake by avoiding high sodium foods and not adding table salt, and to maintain adequate dietary potassium and calcium preferably from fresh fruits, vegetables, and low-fat dairy products.    stressed the importance of regular exercise  Injury prevention: Discussed safety belts, safety helmets, smoke detector, smoking near bedding or upholstery.   Dental health: Discussed importance of regular tooth brushing, flossing, and dental visits.    NEXT PREVENTATIVE PHYSICAL DUE IN 1 YEAR. Return in about 3 months (around 09/23/2021) for MOOD.

## 2021-06-23 NOTE — Assessment & Plan Note (Signed)
Chronic, stable -- never took Gabapentin or Maxalt.  At this time continue current OTC medications at home and adjust as needed.  Will restart preventative in future if needed. Labs today.

## 2021-06-23 NOTE — Patient Instructions (Signed)
[facility]  Address: [address] [place], [place], [place] [address]  Phone: [phone]   Healthy Eating Following a healthy eating pattern may help you to achieve and maintain a healthy body weight, reduce the risk of chronic disease, and live a long and productive life. It is important to follow a healthy eating pattern at an appropriate calorie level for your body. Your nutritional needs should be met primarily through food by choosing a variety of nutrient-rich foods. What are tips for following this plan? Reading food labels Read labels and choose the following: Reduced or low sodium. Juices with 100% fruit juice. Foods with low saturated fats and high polyunsaturated and monounsaturated fats. Foods with whole grains, such as whole wheat, cracked wheat, brown rice, and wild rice. Whole grains that are fortified with folic acid. This is recommended for women who are pregnant or who want to become pregnant. Read labels and avoid the following: Foods with a lot of added sugars. These include foods that contain brown sugar, corn sweetener, corn syrup, dextrose, fructose, glucose, high-fructose corn syrup, honey, invert sugar, lactose, malt syrup, maltose, molasses, raw sugar, sucrose, trehalose, or turbinado sugar. Do not eat more than the following amounts of added sugar per day: 6 teaspoons (25 g) for women. 9 teaspoons (38 g) for men. Foods that contain processed or refined starches and grains. Refined grain products, such as white flour, degermed cornmeal, white bread, and white rice. Shopping Choose nutrient-rich snacks, such as vegetables, whole fruits, and nuts. Avoid high-calorie and high-sugar snacks, such as potato chips, fruit snacks, and candy. Use oil-based dressings and spreads on foods instead of solid fats such as butter, stick margarine, or cream cheese. Limit pre-made sauces, mixes, and "instant" products such as flavored rice, instant  noodles, and ready-made pasta. Try more plant-protein sources, such as tofu, tempeh, black beans, edamame, lentils, nuts, and seeds. Explore eating plans such as the Mediterranean diet or vegetarian diet. Cooking Use oil to saut or stir-fry foods instead of solid fats such as butter, stick margarine, or lard. Try baking, boiling, grilling, or broiling instead of frying. Remove the fatty part of meats before cooking. Steam vegetables in water or broth. Meal planning  At meals, imagine dividing your plate into fourths: One-half of your plate is fruits and vegetables. One-fourth of your plate is whole grains. One-fourth of your plate is protein, especially lean meats, poultry, eggs, tofu, beans, or nuts. Include low-fat dairy as part of your daily diet. Lifestyle Choose healthy options in all settings, including home, work, school, restaurants, or stores. Prepare your food safely: Wash your hands after handling raw meats. Keep food preparation surfaces clean by regularly washing with hot, soapy water. Keep raw meats separate from ready-to-eat foods, such as fruits and vegetables. Cook seafood, meat, poultry, and eggs to the recommended internal temperature. Store foods at safe temperatures. In general: Keep cold foods at 23F (4.4C) or below. Keep hot foods at 123F (60C) or above. Keep your freezer at [facility] (-17.8C) or below. Foods are no longer safe to eat when they have been between the temperatures of 40-123F (4.4-60C) for more than 2 hours. What foods should I eat? Fruits Aim to eat 2 cup-equivalents of fresh, canned (in natural juice), or frozen fruits each day. Examples of 1 cup-equivalent of fruit include 1 small apple, 8 large strawberries, 1 cup canned fruit,  cup dried fruit, or 1 cup 100% juice. Vegetables Aim to eat 2-3 cup-equivalents of fresh and  frozen vegetables each day, including different varieties and colors. Examples of 1 cup-equivalent of vegetables include 2  medium carrots, 2 cups raw, leafy greens, 1 cup chopped vegetable (raw or cooked), or 1 medium baked potato. Grains Aim to eat 6 ounce-equivalents of whole grains each day. Examples of 1 ounce-equivalent of grains include 1 slice of bread, 1 cup ready-to-eat cereal, 3 cups popcorn, or  cup cooked rice, pasta, or cereal. Meats and other proteins Aim to eat 5-6 ounce-equivalents of protein each day. Examples of 1 ounce-equivalent of protein include 1 egg, 1/2 cup nuts or seeds, or 1 tablespoon (16 g) peanut butter. A cut of meat or fish that is the size of a deck of cards is about 3-4 ounce-equivalents. Of the protein you eat each week, try to have at least 8 ounces come from seafood. This includes salmon, trout, herring, and anchovies. Dairy Aim to eat 3 cup-equivalents of fat-free or low-fat dairy each day. Examples of 1 cup-equivalent of dairy include 1 cup (240 mL) milk, 8 ounces (250 g) yogurt, 1 ounces (44 g) natural cheese, or 1 cup (240 mL) fortified soy milk. Fats and oils Aim for about 5 teaspoons (21 g) per day. Choose monounsaturated fats, such as canola and olive oils, avocados, peanut butter, and most nuts, or polyunsaturated fats, such as sunflower, corn, and soybean oils, walnuts, pine nuts, sesame seeds, sunflower seeds, and flaxseed. Beverages Aim for six 8-oz glasses of water per day. Limit coffee to three to five 8-oz cups per day. Limit caffeinated beverages that have added calories, such as soda and energy drinks. Limit alcohol intake to no more than 1 drink a day for nonpregnant women and 2 drinks a day for men. One drink equals 12 oz of beer (355 mL), 5 oz of wine (148 mL), or 1 oz of hard liquor (44 mL). Seasoning and other foods Avoid adding excess amounts of salt to your foods. Try flavoring foods with herbs and spices instead of salt. Avoid adding sugar to foods. Try using oil-based dressings, sauces, and spreads instead of solid fats. This information is based on  general [place] nutrition guidelines. For more information, visit [employer].es. Exact amounts may vary based on your nutrition needs. Summary A healthy eating plan may help you to maintain a healthy weight, reduce the risk of chronic diseases, and stay active throughout your life. Plan your meals. Make sure you eat the right portions of a variety of nutrient-rich foods. Try baking, boiling, grilling, or broiling instead of frying. Choose healthy options in all settings, including home, work, school, restaurants, or stores. This information is not intended to replace advice given to you by your health care provider. Make sure you discuss any questions you have with your health care provider. Document Revised: 12/20/2017 Document Reviewed: 12/20/2017 Elsevier Patient Education  [place].

## 2021-06-23 NOTE — Assessment & Plan Note (Signed)
Noted on past labs at 284, recheck at physical today.  If remains lower side then start supplement.

## 2021-06-23 NOTE — Assessment & Plan Note (Signed)
Chronic, ongoing.  Denies SI/HI.  At this time will continue current medication regimen to include Zoloft and Xanax, has been on for 6 years and does not want to change.  Discussed at length that she may benefit from a change to Celexa or Paxil, which may offer more anxiety benefit, but she wishes to maintain at this time.  Recommend return to therapy, even if virtual (ie. [facility]).  Refills on Xanax sent.  She is agreeable to yearly UDS (done July 2022), every 3 month visit, and signed contract last visit.  Is aware of risks long term benzo use.  May benefit from changes in future. Return in 3 months.

## 2021-06-24 ENCOUNTER — Encounter: Payer: Self-pay | Admitting: Nurse Practitioner

## 2021-06-24 DIAGNOSIS — E7800 Pure hypercholesterolemia, unspecified: Secondary | ICD-10-CM | POA: Insufficient documentation

## 2021-06-24 LAB — COMPREHENSIVE METABOLIC PANEL
ALT: 10 IU/L (ref 0–32)
AST: 15 IU/L (ref 0–40)
Albumin/Globulin Ratio: 2.2 (ref 1.2–2.2)
Albumin: 4.6 g/dL (ref 3.8–4.8)
Alkaline Phosphatase: 60 IU/L (ref 44–121)
BUN/Creatinine Ratio: 21 (ref 9–23)
BUN: 14 mg/dL (ref 6–20)
Bilirubin Total: 0.3 mg/dL (ref 0.0–1.2)
CO2: 23 mmol/L (ref 20–29)
Calcium: 9.7 mg/dL (ref 8.7–10.2)
Chloride: 101 mmol/L (ref 96–106)
Creatinine, Ser: 0.67 mg/dL (ref 0.57–1.00)
Globulin, Total: 2.1 g/dL (ref 1.5–4.5)
Glucose: 88 mg/dL (ref 70–99)
Potassium: 3.8 mmol/L (ref 3.5–5.2)
Sodium: 138 mmol/L (ref 134–144)
Total Protein: 6.7 g/dL (ref 6.0–8.5)
eGFR: 119 mL/min/{1.73_m2} (ref >59)

## 2021-06-24 LAB — LIPID PANEL W/O CHOL/HDL RATIO
Cholesterol, Total: 173 mg/dL (ref 100–199)
HDL: 47 mg/dL (ref >39)
LDL Chol Calc (NIH): 102 mg/dL — ABNORMAL HIGH (ref 0–99)
Triglycerides: 134 mg/dL (ref 0–149)
VLDL Cholesterol Cal: 24 mg/dL (ref 5–40)

## 2021-06-24 LAB — CBC WITH DIFFERENTIAL/PLATELET
Basophils Absolute: 0.1 10*3/uL (ref 0.0–0.2)
Basos: 1 %
EOS (ABSOLUTE): 0.1 10*3/uL (ref 0.0–0.4)
Eos: 1 %
Hematocrit: 37.3 % (ref 34.0–46.6)
Hemoglobin: 12.2 g/dL (ref 11.1–15.9)
Immature Grans (Abs): 0 10*3/uL (ref 0.0–0.1)
Immature Granulocytes: 0 %
Lymphocytes Absolute: 1.7 10*3/uL (ref 0.7–3.1)
Lymphs: 25 %
MCH: 28.6 pg (ref 26.6–33.0)
MCHC: 32.7 g/dL (ref 31.5–35.7)
MCV: 88 fL (ref 79–97)
Monocytes Absolute: 0.5 10*3/uL (ref 0.1–0.9)
Monocytes: 8 %
Neutrophils Absolute: 4.4 10*3/uL (ref 1.4–7.0)
Neutrophils: 65 %
Platelets: 277 10*3/uL (ref 150–450)
RBC: 4.26 x10E6/uL (ref 3.77–5.28)
RDW: 12.7 % (ref 11.7–15.4)
WBC: 6.8 10*3/uL (ref 3.4–10.8)

## 2021-06-24 LAB — VITAMIN B12: Vitamin B-12: 625 pg/mL (ref 232–1245)

## 2021-06-24 LAB — TSH: TSH: 1.34 u[IU]/mL (ref 0.450–4.500)

## 2021-06-24 NOTE — Progress Notes (Signed)
Contacted via [place] morning [place], your labs have returned and overall look great.  B12 level is improving, if taking supplement then continue this.  The only off thing is the LDL.  Your LDL is above normal. The LDL is the bad cholesterol. Over time and in combination with inflammation and other factors, this contributes to plaque which in turn may lead to stroke and/or heart attack down the road. Sometimes high LDL is primarily genetic, and people might be eating all the right foods but still have high numbers. Other times, there is room for improvement in one's diet and eating healthier can bring this number down and potentially reduce one's risk of heart attack and/or stroke.   To reduce your LDL, Remember - more fruits and vegetables, more fish, and limit red meat and dairy products. More soy, nuts, beans, barley, lentils, oats and plant sterol ester enriched margarine instead of butter. I also encourage eliminating sugar and processed food. Remember, shop on the outside of the grocery store and visit your [employer]. If you would like to talk with me about dietary changes for your cholesterol, please let me know. We should recheck your cholesterol in 12 months.  Any questions? Keep being amazing!!  Thank you for allowing me to participate in your care.  I appreciate you. Kindest regards, Layza Summa

## 2021-07-03 ENCOUNTER — Inpatient Hospital Stay: Admission: RE | Admit: 2021-07-03 | Source: Ambulatory Visit

## 2021-07-08 ENCOUNTER — Ambulatory Visit
Admission: RE | Admit: 2021-07-08 | Discharge: 2021-07-08 | Disposition: A | Discharge status: Home / Self Care | Source: Ambulatory Visit | Attending: Nurse Practitioner | Admitting: Nurse Practitioner

## 2021-07-08 ENCOUNTER — Other Ambulatory Visit: Payer: Self-pay

## 2021-07-08 DIAGNOSIS — Z87898 Personal history of other specified conditions: Secondary | ICD-10-CM | POA: Insufficient documentation

## 2021-07-08 NOTE — Progress Notes (Signed)
Contacted via [place] afternoon [place], your breast ultrasound remains stable.  It is recommended to repeat this in one year to ensure stability of area and no changes.  We will repeat this next October.  Have a great day!! Keep being awesome!!  Thank you for allowing me to participate in your care.  I appreciate you. Kindest regards, Berda Shelvin
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
## 2021-07-18 ENCOUNTER — Ambulatory Visit: Payer: Self-pay

## 2021-07-18 NOTE — Telephone Encounter (Signed)
Pt. Reports lower right-sided abdominal pain this week. Pain comes and goes.Last bowel movement 2 days ago. No nausea or vomiting. No fever. No availability today. Requests appointment Monday. Appointment made . Instructed to go to UC/ED for worsening of symptoms.     Reason for Disposition  [1] MODERATE pain (e.g., interferes with normal activities) AND [2] pain comes and goes (cramps) AND [3] present > 24 hours  (Exception: pain with Vomiting or Diarrhea - see that Guideline)  Answer Assessment - Initial Assessment Questions 1. LOCATION: "Where does it hurt?"      Right lower 2. RADIATION: "Does the pain shoot anywhere else?" (e.g., chest, back)     Leg 3. ONSET: "When did the pain begin?" (e.g., minutes, hours or days ago)      This week 4. SUDDEN: "Gradual or sudden onset?"     Gradual 5. PATTERN "Does the pain come and go, or is it constant?"    - If constant: "Is it getting better, staying the same, or worsening?"      (Note: Constant means the pain never goes away completely; most serious pain is constant and it progresses)     - If intermittent: "How long does it last?" "Do you have pain now?"     (Note: Intermittent means the pain goes away completely between bouts)     Comes and goes 6. SEVERITY: "How bad is the pain?"  (e.g., Scale 1-10; mild, moderate, or severe)   - MILD (1-3): doesn't interfere with normal activities, abdomen soft and not tender to touch    - MODERATE (4-7): interferes with normal activities or awakens from sleep, abdomen tender to touch    - SEVERE (8-10): excruciating pain, doubled over, unable to do any normal activities      Now - 4-5 7. RECURRENT SYMPTOM: "Have you ever had this type of stomach pain before?" If Yes, ask: "When was the last time?" and "What happened that time?"      No 8. CAUSE: "What do you think is causing the stomach pain?"     Unsure 9. RELIEVING/AGGRAVATING FACTORS: "What makes it better or worse?" (e.g., movement, antacids,  bowel movement)     No 10. OTHER SYMPTOMS: "Do you have any other symptoms?" (e.g., back pain, diarrhea, fever, urination pain, vomiting)       No 11. PREGNANCY: "Is there any chance you are pregnant?" "When was your last menstrual period?"       No  Protocols used: Abdominal Pain - [facility]

## 2021-07-21 ENCOUNTER — Ambulatory Visit (INDEPENDENT_AMBULATORY_CARE_PROVIDER_SITE_OTHER): Admitting: Internal Medicine

## 2021-07-21 ENCOUNTER — Ambulatory Visit
Admission: RE | Admit: 2021-07-21 | Discharge: 2021-07-21 | Disposition: A | Discharge status: Home / Self Care | Attending: Internal Medicine | Admitting: Internal Medicine

## 2021-07-21 ENCOUNTER — Other Ambulatory Visit: Payer: Self-pay

## 2021-07-21 ENCOUNTER — Ambulatory Visit
Admission: RE | Admit: 2021-07-21 | Discharge: 2021-07-21 | Disposition: A | Discharge status: Home / Self Care | Source: Ambulatory Visit | Attending: Internal Medicine | Admitting: Internal Medicine

## 2021-07-21 ENCOUNTER — Encounter: Payer: Self-pay | Admitting: Internal Medicine

## 2021-07-21 VITALS — BP 105/71 | HR 71 | Temp 98.8°F | Ht 63.39 in | Wt 142.2 lb

## 2021-07-21 DIAGNOSIS — R1031 Right lower quadrant pain: Secondary | ICD-10-CM | POA: Diagnosis not present

## 2021-07-21 LAB — CBC WITH DIFFERENTIAL/PLATELET
Hematocrit: 39.4 % (ref 34.0–46.6)
Hemoglobin: 13.9 g/dL (ref 11.1–15.9)
Lymphocytes Absolute: 1.9 10*3/uL (ref 0.7–3.1)
Lymphs: 28 %
MCH: 29.9 pg (ref 26.6–33.0)
MCHC: 35.3 g/dL (ref 31.5–35.7)
MCV: 85 fL (ref 79–97)
MID (Absolute): 0.6 10*3/uL (ref 0.1–1.6)
MID: 9 %
Neutrophils Absolute: 4.2 10*3/uL (ref 1.4–7.0)
Neutrophils: 63 %
Platelets: 281 10*3/uL (ref 150–450)
RBC: 4.65 x10E6/uL (ref 3.77–5.28)
RDW: 13.2 % (ref 11.7–15.4)
WBC: 6.7 10*3/uL (ref 3.4–10.8)

## 2021-07-21 LAB — URINALYSIS, ROUTINE W REFLEX MICROSCOPIC
Bilirubin, UA: NEGATIVE
Glucose, UA: NEGATIVE
Ketones, UA: NEGATIVE
Leukocytes,UA: NEGATIVE
Nitrite, UA: NEGATIVE
Protein,UA: NEGATIVE
RBC, UA: NEGATIVE
Specific Gravity, UA: 1.025 (ref 1.005–1.030)
Urobilinogen, Ur: 1 mg/dL (ref 0.2–1.0)
pH, UA: 5.5 (ref 5.0–7.5)

## 2021-07-21 MED ORDER — DOCUSATE SODIUM 50 MG PO CAPS: 50.0000 mg | ORAL_CAPSULE | Freq: Two times a day (BID) | ORAL | 0 refills | Status: DC

## 2021-07-21 NOTE — Progress Notes (Signed)
BP 105/71   Pulse 71   Temp 98.8 F (37.1 C) (Oral)   Ht 5' 3.39" (1.61 m)   Wt 142 lb 3.2 oz (64.5 kg)   LMP 06/22/2021 (Approximate)   SpO2 99%   BMI 24.88 kg/m    Subjective:    Patient ID: Stephanie Logan, female    DOB: 08-Oct-1990, 30 y.o.   MRN: [id number]  Chief Complaint  Patient presents with   Abdominal Pain    For the past week on the right side .    HPI: Stephanie Logan is a 30 y.o. female  LMP October 1 st week. Should be coming up any day now. Not pregnant.  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 Sees urology @ Dr. Elenor Quinones.   Abdominal Pain This is a new (worse on friday.) problem. The current episode started in the past 7 days (symptoms started last friday. saturday morning a little better, yesterday was worse feels like a aching pain, right now feels like a background pain. friday was sharp.). The problem has been waxing and waning. The pain is located in the RLQ. The pain is at a severity of 6/10. Associated symptoms include constipation. Pertinent negatives include no belching, diarrhea, dysuria, fever, flatus, frequency, headaches, hematochezia, hematuria, melena, myalgias, nausea or vomiting. Associated symptoms comments: Last stool Wednesday -  baseline doesn't go every day.   Chief Complaint  Patient presents with   Abdominal Pain    For the past week on the right side .    Relevant past medical, surgical, family and social history reviewed and updated as indicated. Interim medical history since our last visit reviewed. Allergies and medications reviewed and updated.  Review of Systems  Constitutional:  Negative for fever.  Gastrointestinal:  Positive for abdominal pain and constipation. Negative for diarrhea, flatus, hematochezia, melena, nausea and vomiting.  Genitourinary:  Negative for dysuria, frequency and hematuria.  Musculoskeletal:  Negative for  myalgias.  Neurological:  Negative for headaches.   Per HPI unless specifically indicated above     Objective:    BP 105/71   Pulse 71   Temp 98.8 F (37.1 C) (Oral)   Ht 5' 3.39" (1.61 m)   Wt 142 lb 3.2 oz (64.5 kg)   LMP 06/22/2021 (Approximate)   SpO2 99%   BMI 24.88 kg/m   Wt Readings from Last 3 Encounters:  07/21/21 142 lb 3.2 oz (64.5 kg)  06/23/21 144 lb (65.3 kg)  04/16/21 142 lb (64.4 kg)    Physical Exam Vitals and nursing note reviewed.  Constitutional:      General: She is not in acute distress.    Appearance: Normal appearance. She is not ill-appearing or diaphoretic.  HENT:     Head: Normocephalic and atraumatic.     Right Ear: Tympanic membrane and external ear normal. There is no impacted cerumen.     Left Ear: External ear normal.     Nose: No congestion or rhinorrhea.     Mouth/Throat:     Pharynx: No oropharyngeal exudate or posterior oropharyngeal erythema.  Eyes:     Conjunctiva/sclera: Conjunctivae normal.     Pupils: Pupils are equal, round, and reactive to light.  Cardiovascular:     Rate and Rhythm: Normal rate and regular rhythm.     Heart sounds: No murmur heard.   No friction rub. No gallop.  Pulmonary:  Effort: No respiratory distress.     Breath sounds: No stridor. No wheezing or rhonchi.  Chest:     Chest wall: No tenderness.  Abdominal:     General: Abdomen is flat. Bowel sounds are normal. There is no distension.     Palpations: Abdomen is soft. There is no mass.     Tenderness: There is no abdominal tenderness. There is no guarding.  Musculoskeletal:        General: No swelling or deformity.     Cervical back: Normal range of motion and neck supple. No rigidity or tenderness.     Right lower leg: No edema.     Left lower leg: No edema.  Skin:    General: Skin is warm and dry.     Coloration: Skin is not jaundiced.     Findings: No erythema.  Neurological:     Mental Status: She is alert and oriented to person, place,  and time. Mental status is at baseline.  Psychiatric:        Mood and Affect: Mood normal.        Behavior: Behavior normal.        Thought Content: Thought content normal.        Judgment: Judgment normal.    Results for orders placed or performed in visit on 06/23/21  CBC with Differential/Platelet  Result Value Ref Range   WBC 6.8 3.4 - 10.8 x10E3/uL   RBC 4.26 3.77 - 5.28 x10E6/uL   Hemoglobin 12.2 11.1 - 15.9 g/dL   Hematocrit 37.3 34.0 - 46.6 %   MCV 88 79 - 97 fL   MCH 28.6 26.6 - 33.0 pg   MCHC 32.7 31.5 - 35.7 g/dL   RDW 12.7 11.7 - 15.4 %   Platelets 277 150 - 450 x10E3/uL   Neutrophils 65 Not Estab. %   Lymphs 25 Not Estab. %   Monocytes 8 Not Estab. %   Eos 1 Not Estab. %   Basos 1 Not Estab. %   Neutrophils Absolute 4.4 1.4 - 7.0 x10E3/uL   Lymphocytes Absolute 1.7 0.7 - 3.1 x10E3/uL   Monocytes Absolute 0.5 0.1 - 0.9 x10E3/uL   EOS (ABSOLUTE) 0.1 0.0 - 0.4 x10E3/uL   Basophils Absolute 0.1 0.0 - 0.2 x10E3/uL   Immature Granulocytes 0 Not Estab. %   Immature Grans (Abs) 0.0 0.0 - 0.1 x10E3/uL  Comprehensive metabolic panel  Result Value Ref Range   Glucose 88 70 - 99 mg/dL   BUN 14 6 - 20 mg/dL   Creatinine, Ser 0.67 0.57 - 1.00 mg/dL   eGFR 119 >59 mL/min/1.73   BUN/Creatinine Ratio 21 9 - 23   Sodium 138 134 - 144 mmol/L   Potassium 3.8 3.5 - 5.2 mmol/L   Chloride 101 96 - 106 mmol/L   CO2 23 20 - 29 mmol/L   Calcium 9.7 8.7 - 10.2 mg/dL   Total Protein 6.7 6.0 - 8.5 g/dL   Albumin 4.6 3.8 - 4.8 g/dL   Globulin, Total 2.1 1.5 - 4.5 g/dL   Albumin/Globulin Ratio 2.2 1.2 - 2.2   Bilirubin Total 0.3 0.0 - 1.2 mg/dL   Alkaline Phosphatase 60 44 - 121 IU/L   AST 15 0 - 40 IU/L   ALT 10 0 - 32 IU/L  Lipid Panel w/o Chol/HDL Ratio  Result Value Ref Range   Cholesterol, Total 173 100 - 199 mg/dL   Triglycerides 134 0 - 149 mg/dL   HDL 47 >39 mg/dL  VLDL Cholesterol Cal 24 5 - 40 mg/dL   LDL Chol Calc (NIH) 102 (H) 0 - 99 mg/dL  TSH  Result Value Ref  Range   TSH 1.340 0.450 - 4.500 uIU/mL  Vitamin B12  Result Value Ref Range   Vitamin B-12 625 232 - 1,245 pg/mL        Current Outpatient Medications:    docusate sodium (COLACE) 50 MG capsule, Take 1 capsule (50 mg total) by mouth 2 (two) times daily for 14 days., Disp: 28 capsule, Rfl: 0   ALPRAZolam (XANAX) 0.5 MG tablet, Take 1 tablet (0.5 mg total) by mouth 2 (two) times daily as needed for anxiety., Disp: 60 tablet, Rfl: 2   ascorbic acid (VITAMIN C) 500 MG tablet, Take 500 mg by mouth daily., Disp: , Rfl:    meloxicam (MOBIC) 7.5 MG tablet, Take 1 tablet (7.5 mg total) by mouth daily., Disp: 90 tablet, Rfl: 2   rizatriptan (MAXALT) 5 MG tablet, Take 1 tablet (5 mg total) by mouth as needed for migraine. May repeat in 2 hours if needed, Disp: 10 tablet, Rfl: 4   sertraline (ZOLOFT) 100 MG tablet, Take 1 tablet (100 mg total) by mouth at bedtime., Disp: 90 tablet, Rfl: 4   thiamine (VITAMIN B-1) 100 MG tablet, Take 100 mg by mouth daily., Disp: , Rfl:     Assessment & Plan:  1> RLQ pain ? Sec to constipation ? Sec to ovarian etiology vs constipation  Obtained STAT AXR which shows large amount of stools Start pt on colace  If doesn't work, might need bw / investigations  / changing to [employer]  // linzess.  will refer to OB/GYN sec to PCOS.  increase fiber in diet.  Increase vegetables in diet.  Increase water to 8 - 10 glasses a day  Problem List Items Addressed This Visit       Other   RLQ abdominal pain - Primary   Relevant Orders   DG Abd 2 Views (Completed)   Urinalysis, Routine w reflex microscopic   CBC With Differential/Platelet   CBC with Differential/Platelet   Comprehensive metabolic panel   Ambulatory referral to Obstetrics / Gynecology     Orders Placed This Encounter  Procedures   DG Abd 2 Views   Urinalysis, Routine w reflex microscopic   CBC With Differential/Platelet   CBC with Differential/Platelet   Comprehensive metabolic panel   Ambulatory  referral to Obstetrics / Gynecology     Meds ordered this encounter  Medications   docusate sodium (COLACE) 50 MG capsule    Sig: Take 1 capsule (50 mg total) by mouth 2 (two) times daily for 14 days.    Dispense:  28 capsule    Refill:  0     Follow up plan: No follow-ups on file.

## 2021-07-21 NOTE — Progress Notes (Signed)
Pl let her know has large amt of stools need colace and if not better needs stronger meds fu with pcp as scheudled

## 2021-07-22 LAB — COMPREHENSIVE METABOLIC PANEL
ALT: 14 IU/L (ref 0–32)
AST: 16 IU/L (ref 0–40)
Albumin/Globulin Ratio: 2.4 — ABNORMAL HIGH (ref 1.2–2.2)
Albumin: 5.1 g/dL — ABNORMAL HIGH (ref 3.8–4.8)
Alkaline Phosphatase: 60 IU/L (ref 44–121)
BUN/Creatinine Ratio: 17 (ref 9–23)
BUN: 12 mg/dL (ref 6–20)
Bilirubin Total: 0.3 mg/dL (ref 0.0–1.2)
CO2: 24 mmol/L (ref 20–29)
Calcium: 9.8 mg/dL (ref 8.7–10.2)
Chloride: 101 mmol/L (ref 96–106)
Creatinine, Ser: 0.71 mg/dL (ref 0.57–1.00)
Globulin, Total: 2.1 g/dL (ref 1.5–4.5)
Glucose: 84 mg/dL (ref 70–99)
Potassium: 4.1 mmol/L (ref 3.5–5.2)
Sodium: 139 mmol/L (ref 134–144)
Total Protein: 7.2 g/dL (ref 6.0–8.5)
eGFR: 116 mL/min/{1.73_m2} (ref >59)

## 2021-07-22 LAB — CBC WITH DIFFERENTIAL/PLATELET
Basophils Absolute: 0.1 10*3/uL (ref 0.0–0.2)
Basos: 1 %
EOS (ABSOLUTE): 0.2 10*3/uL (ref 0.0–0.4)
Eos: 2 %
Hematocrit: 42.9 % (ref 34.0–46.6)
Hemoglobin: 13.7 g/dL (ref 11.1–15.9)
Immature Grans (Abs): 0 10*3/uL (ref 0.0–0.1)
Immature Granulocytes: 0 %
Lymphocytes Absolute: 1.8 10*3/uL (ref 0.7–3.1)
Lymphs: 28 %
MCH: 28.1 pg (ref 26.6–33.0)
MCHC: 31.9 g/dL (ref 31.5–35.7)
MCV: 88 fL (ref 79–97)
Monocytes Absolute: 0.5 10*3/uL (ref 0.1–0.9)
Monocytes: 8 %
Neutrophils Absolute: 4 10*3/uL (ref 1.4–7.0)
Neutrophils: 61 %
Platelets: 290 10*3/uL (ref 150–450)
RBC: 4.88 x10E6/uL (ref 3.77–5.28)
RDW: 12.5 % (ref 11.7–15.4)
WBC: 6.5 10*3/uL (ref 3.4–10.8)

## 2021-07-24 ENCOUNTER — Encounter: Payer: Self-pay | Admitting: Nurse Practitioner

## 2021-07-24 ENCOUNTER — Telehealth: Payer: Self-pay | Admitting: Nurse Practitioner

## 2021-07-24 ENCOUNTER — Ambulatory Visit
Admission: RE | Admit: 2021-07-24 | Discharge: 2021-07-24 | Disposition: A | Discharge status: Home / Self Care | Source: Ambulatory Visit | Attending: Nurse Practitioner | Admitting: Nurse Practitioner

## 2021-07-24 ENCOUNTER — Other Ambulatory Visit: Payer: Self-pay

## 2021-07-24 ENCOUNTER — Ambulatory Visit (INDEPENDENT_AMBULATORY_CARE_PROVIDER_SITE_OTHER): Admitting: Nurse Practitioner

## 2021-07-24 VITALS — BP 94/62 | HR 81 | Temp 98.7°F | Wt 140.6 lb

## 2021-07-24 DIAGNOSIS — R1031 Right lower quadrant pain: Secondary | ICD-10-CM

## 2021-07-24 LAB — URINALYSIS, ROUTINE W REFLEX MICROSCOPIC
Bilirubin, UA: NEGATIVE
Glucose, UA: NEGATIVE
Ketones, UA: NEGATIVE
Leukocytes,UA: NEGATIVE
Nitrite, UA: NEGATIVE
Protein,UA: NEGATIVE
RBC, UA: NEGATIVE
Specific Gravity, UA: 1.015 (ref 1.005–1.030)
Urobilinogen, Ur: 0.2 mg/dL (ref 0.2–1.0)
pH, UA: 7 (ref 5.0–7.5)

## 2021-07-24 LAB — PREGNANCY, URINE: Preg Test, Ur: NEGATIVE

## 2021-07-24 MED ORDER — IOHEXOL 300 MG/ML  SOLN
100.0000 mL | Freq: Once | INTRAMUSCULAR | Status: AC | PRN
  Administered 2021-07-24: 100 mL via INTRAVENOUS

## 2021-07-24 NOTE — Patient Instructions (Signed)
Abdominal Pain, Adult Many things can cause belly (abdominal) pain. Most times, belly pain is not dangerous. Many cases of belly pain can be watched and treated at home. Sometimes, though, belly pain is serious. Yourdoctor will try to find the cause of your belly pain. Follow these instructions at home:  Medicines Take over-the-counter and prescription medicines only as told by your doctor. Do not take medicines that help you poop (laxatives) unless told by your doctor. General instructions Watch your belly pain for any changes. Drink enough fluid to keep your pee (urine) pale yellow. Keep all follow-up visits as told by your doctor. This is important. Contact a doctor if: Your belly pain changes or gets worse. You are not hungry, or you lose weight without trying. You are having trouble pooping (constipated) or have watery poop (diarrhea) for more than 2-3 days. You have pain when you pee or poop. Your belly pain wakes you up at night. Your pain gets worse with meals, after eating, or with certain foods. You are vomiting and cannot keep anything down. You have a fever. You have blood in your pee. Get help right away if: Your pain does not go away as soon as your doctor says it should. You cannot stop vomiting. Your pain is only in areas of your belly, such as the right side or the left lower part of the belly. You have bloody or black poop, or poop that looks like tar. You have very bad pain, cramping, or bloating in your belly. You have signs of not having enough fluid or water in your body (dehydration), such as: Dark pee, very little pee, or no pee. Cracked lips. Dry mouth. Sunken eyes. Sleepiness. Weakness. You have trouble breathing or chest pain. Summary Many cases of belly pain can be watched and treated at home. Watch your belly pain for any changes. Take over-the-counter and prescription medicines only as told by your doctor. Contact a doctor if your belly pain  changes or gets worse. Get help right away if you have very bad pain, cramping, or bloating in your belly. This information is not intended to replace advice given to you by your health care provider. Make sure you discuss any questions you have with your healthcare provider. Document Revised: 01/16/2019 Document Reviewed: 01/16/2019 Elsevier Patient Education  2022 [employer]  

## 2021-07-24 NOTE — Telephone Encounter (Signed)
Spoke to [place] on phone and reviewed CT imaging results.  Normal appendix noted, no appendicitis.  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  She continues to show moderate stool burden, slow transit constipation.  Recommend she start taking Miralax daily and if ongoing issues with this will consider adding on Linzess -- she does have underlying anxiety and could be some IBS present.  Return to office as scheduled.  She reports understanding and all questions answered.  Stated appreciation for call.

## 2021-07-24 NOTE — Progress Notes (Signed)
BP 94/62   Pulse 81   Temp 98.7 F (37.1 C) (Oral)   Wt 140 lb 9.6 oz (63.8 kg)   SpO2 98%   BMI 24.60 kg/m    Subjective:    Patient ID: Stephanie Logan, female    DOB: 05-02-1991, 30 y.o.   MRN: [id number]  HPI: Stephanie Logan is a 30 y.o. female  Chief Complaint  Patient presents with   Abdominal Pain    Patient states she is here to follow up on Abdominal Pain and states nothing has changed and it has not gotten any better. Patient states she has been taking Tylenol to try and help with it. Patient states the pain comes and goes. Patient states it is located near her R lower pelvic area near ovary. Patient states the results of imaging showed constipation but patient is currently taking OTC medications for Constipation. Patient states she has PCOS and states she has never felt the pain from an ovarian.   ABDOMINAL PAIN (RLQ) Follow-up today for abdominal pain, seen initially by Dr. Neomia Dear on 07/21/21 and had imaging that noted large amount of stool, she was started on Colace and a referral placed to GYN.  Pain started week before Halloween.  She is scheduled to see GYN on the 14th.  Has underlying PCOS, has not had recent imaging of ovaries.  Has not had pain like this before.    Had x-ray imaging noted large amount of retained fecal material -- she took Colace and Miralax, was able to pass bowels -- passed stool all day yesterday.   Duration:days Onset: sudden Severity: 5/10 Quality: sharp, aching, stabbing, and throbbing Location:  RLQ  Episode duration:  Radiation:  did initially radiate down leg Frequency: intermittent Alleviating factors: heat Aggravating factors: nothing Status: fluctuating Treatments attempted:  Colace and Miralax, Tylenol Fever: no Nausea: yes Vomiting: no Weight loss: no Decreased appetite: yes Diarrhea:  loose stool with Colace Constipation: no Blood in stool: no Heartburn: no Jaundice: no Rash: no Dysuria/urinary frequency:  no Hematuria: no History of sexually transmitted disease: no Recurrent NSAID use: no   Relevant past medical, surgical, family and social history reviewed and updated as indicated. Interim medical history since our last visit reviewed. Allergies and medications reviewed and updated.  Review of Systems  Constitutional:  Negative for activity change, appetite change, diaphoresis, fatigue and fever.  Respiratory:  Negative for cough, chest tightness, shortness of breath and wheezing.   Cardiovascular:  Negative for chest pain, palpitations and leg swelling.  Gastrointestinal:  Positive for abdominal pain. Negative for abdominal distention, blood in stool, constipation, diarrhea, nausea and vomiting.  Endocrine: Negative.   Neurological: Negative.   Psychiatric/Behavioral: Negative.     Per HPI unless specifically indicated above     Objective:    BP 94/62   Pulse 81   Temp 98.7 F (37.1 C) (Oral)   Wt 140 lb 9.6 oz (63.8 kg)   SpO2 98%   BMI 24.60 kg/m   Wt Readings from Last 3 Encounters:  07/24/21 140 lb 9.6 oz (63.8 kg)  07/21/21 142 lb 3.2 oz (64.5 kg)  06/23/21 144 lb (65.3 kg)    Physical Exam Vitals and nursing note reviewed.  Constitutional:      General: She is awake. She is not in acute distress.    Appearance: She is well-developed and well-groomed. She is not ill-appearing or toxic-appearing.  HENT:     Head: Normocephalic.     Right Ear: Hearing  normal.     Left Ear: Hearing normal.  Eyes:     General: Lids are normal.        Right eye: No discharge.        Left eye: No discharge.     Conjunctiva/sclera: Conjunctivae normal.     Pupils: Pupils are equal, round, and reactive to light.  Neck:     Thyroid: No thyromegaly.     Vascular: No carotid bruit.  Cardiovascular:     Rate and Rhythm: Normal rate and regular rhythm.     Heart sounds: Normal heart sounds. No murmur heard.   No gallop.  Pulmonary:     Effort: Pulmonary effort is normal. No  accessory muscle usage or respiratory distress.     Breath sounds: Normal breath sounds.  Abdominal:     General: Bowel sounds are normal. There is no distension.     Palpations: Abdomen is soft. There is no hepatomegaly.     Tenderness: There is abdominal tenderness in the right lower quadrant. There is no right CVA tenderness, left CVA tenderness or guarding. Positive signs include Rovsing's sign and McBurney's sign. Negative signs include Murphy's sign, psoas sign and obturator sign.  Musculoskeletal:     Cervical back: Normal range of motion and neck supple.     Right lower leg: No edema.     Left lower leg: No edema.  Lymphadenopathy:     Cervical: No cervical adenopathy.  Skin:    General: Skin is warm and dry.  Neurological:     Mental Status: She is alert and oriented to person, place, and time.     Motor: Motor function is intact.     Coordination: Coordination is intact.     Gait: Gait is intact.     Deep Tendon Reflexes: Reflexes are normal and symmetric.     Reflex Scores:      Brachioradialis reflexes are 2+ on the right side and 2+ on the left side.      Patellar reflexes are 2+ on the right side and 2+ on the left side. Psychiatric:        Attention and Perception: Attention normal.        Mood and Affect: Mood normal.        Speech: Speech normal.        Behavior: Behavior normal. Behavior is cooperative.        Thought Content: Thought content normal.   Results for orders placed or performed in visit on 07/21/21  Urinalysis, Routine w reflex microscopic  Result Value Ref Range   Specific Gravity, UA 1.025 1.005 - 1.030   pH, UA 5.5 5.0 - 7.5   Color, UA Yellow Yellow   Appearance Ur Clear Clear   Leukocytes,UA Negative Negative   Protein,UA Negative Negative/Trace   Glucose, UA Negative Negative   Ketones, UA Negative Negative   RBC, UA Negative Negative   Bilirubin, UA Negative Negative   Urobilinogen, Ur 1.0 0.2 - 1.0 mg/dL   Nitrite, UA Negative  Negative  CBC With Differential/Platelet  Result Value Ref Range   WBC 6.7 3.4 - 10.8 x10E3/uL   RBC 4.65 3.77 - 5.28 x10E6/uL   Hemoglobin 13.9 11.1 - 15.9 g/dL   Hematocrit 39.4 34.0 - 46.6 %   MCV 85 79 - 97 fL   MCH 29.9 26.6 - 33.0 pg   MCHC 35.3 31.5 - 35.7 g/dL   RDW 13.2 11.7 - 15.4 %   Platelets 281 150 -   450 x10E3/uL   Neutrophils 63 Not Estab. %   Lymphs 28 Not Estab. %   MID 9 Not Estab. %   Neutrophils Absolute 4.2 1.4 - 7.0 x10E3/uL   Lymphocytes Absolute 1.9 0.7 - 3.1 x10E3/uL   MID (Absolute) 0.6 0.1 - 1.6 X10E3/uL  CBC with Differential/Platelet  Result Value Ref Range   WBC 6.5 3.4 - 10.8 x10E3/uL   RBC 4.88 3.77 - 5.28 x10E6/uL   Hemoglobin 13.7 11.1 - 15.9 g/dL   Hematocrit 42.9 34.0 - 46.6 %   MCV 88 79 - 97 fL   MCH 28.1 26.6 - 33.0 pg   MCHC 31.9 31.5 - 35.7 g/dL   RDW 12.5 11.7 - 15.4 %   Platelets 290 150 - 450 x10E3/uL   Neutrophils 61 Not Estab. %   Lymphs 28 Not Estab. %   Monocytes 8 Not Estab. %   Eos 2 Not Estab. %   Basos 1 Not Estab. %   Neutrophils Absolute 4.0 1.4 - 7.0 x10E3/uL   Lymphocytes Absolute 1.8 0.7 - 3.1 x10E3/uL   Monocytes Absolute 0.5 0.1 - 0.9 x10E3/uL   EOS (ABSOLUTE) 0.2 0.0 - 0.4 x10E3/uL   Basophils Absolute 0.1 0.0 - 0.2 x10E3/uL   Immature Granulocytes 0 Not Estab. %   Immature Grans (Abs) 0.0 0.0 - 0.1 x10E3/uL  Comprehensive metabolic panel  Result Value Ref Range   Glucose 84 70 - 99 mg/dL   BUN 12 6 - 20 mg/dL   Creatinine, Ser 0.71 0.57 - 1.00 mg/dL   eGFR 116 >59 mL/min/1.73   BUN/Creatinine Ratio 17 9 - 23   Sodium 139 134 - 144 mmol/L   Potassium 4.1 3.5 - 5.2 mmol/L   Chloride 101 96 - 106 mmol/L   CO2 24 20 - 29 mmol/L   Calcium 9.8 8.7 - 10.2 mg/dL   Total Protein 7.2 6.0 - 8.5 g/dL   Albumin 5.1 (H) 3.8 - 4.8 g/dL   Globulin, Total 2.1 1.5 - 4.5 g/dL   Albumin/Globulin Ratio 2.4 (H) 1.2 - 2.2   Bilirubin Total 0.3 0.0 - 1.2 mg/dL   Alkaline Phosphatase 60 44 - 121 IU/L   AST 16 0 - 40 IU/L    ALT 14 0 - 32 IU/L      Assessment & Plan:   Problem List Items Addressed This Visit       Other   RLQ abdominal pain - Primary    Acute and ongoing post treatment for constipation.  At this time does have some positive signs on exam -- will obtain CT abdomen and pelvis STAT to r/o appendicitis vs ovarian cyst.  Labs today CBC, CMP, UA, and urine pregnancy. UA is unremarkable and pregnancy testing negative.  Will determine next steps after imaging performed.  At this time recommend continued use of heat and Tylenol as needed.  Recommend Metamucil daily at home.  Stop Colace.  Return on Monday for follow-up, sooner if worsening or if worsening over weekend head to ER or UC.        Relevant Orders   CBC with Differential/Platelet   Comprehensive metabolic panel   TSH   Urinalysis, Routine w reflex microscopic   Pregnancy, urine   CT Abdomen Pelvis W Contrast     Follow up plan: Return in about 4 days (around 07/28/2021) for Abdominal pain -- may overbook with my 10 am physical..

## 2021-07-24 NOTE — Assessment & Plan Note (Addendum)
Acute and ongoing post treatment for constipation.  At this time does have some positive signs on exam -- will obtain CT abdomen and pelvis STAT to r/o appendicitis vs ovarian cyst.  Labs today CBC, CMP, UA, and urine pregnancy. UA is unremarkable and pregnancy testing negative.  Will determine next steps after imaging performed.  At this time recommend continued use of heat and Tylenol as needed.  Recommend Metamucil daily at home.  Stop Colace.  Return on Monday for follow-up, sooner if worsening or if worsening over weekend head to ER or UC.

## 2021-07-24 NOTE — Progress Notes (Signed)
Reviewed telephone note from 07/24/21

## 2021-07-25 LAB — COMPREHENSIVE METABOLIC PANEL
ALT: 13 IU/L (ref 0–32)
AST: 20 IU/L (ref 0–40)
Albumin/Globulin Ratio: 1.6 (ref 1.2–2.2)
Albumin: 4.7 g/dL (ref 3.8–4.8)
Alkaline Phosphatase: 64 IU/L (ref 44–121)
BUN/Creatinine Ratio: 15 (ref 9–23)
BUN: 12 mg/dL (ref 6–20)
Bilirubin Total: 0.4 mg/dL (ref 0.0–1.2)
CO2: 24 mmol/L (ref 20–29)
Calcium: 9.8 mg/dL (ref 8.7–10.2)
Chloride: 100 mmol/L (ref 96–106)
Creatinine, Ser: 0.82 mg/dL (ref 0.57–1.00)
Globulin, Total: 2.9 g/dL (ref 1.5–4.5)
Glucose: 75 mg/dL (ref 70–99)
Potassium: 4.1 mmol/L (ref 3.5–5.2)
Sodium: 140 mmol/L (ref 134–144)
Total Protein: 7.6 g/dL (ref 6.0–8.5)
eGFR: 97 mL/min/{1.73_m2} (ref >59)

## 2021-07-25 LAB — CBC WITH DIFFERENTIAL/PLATELET
Basophils Absolute: 0.1 10*3/uL (ref 0.0–0.2)
Basos: 1 %
EOS (ABSOLUTE): 0.1 10*3/uL (ref 0.0–0.4)
Eos: 1 %
Hematocrit: 40.6 % (ref 34.0–46.6)
Hemoglobin: 13.3 g/dL (ref 11.1–15.9)
Immature Grans (Abs): 0 10*3/uL (ref 0.0–0.1)
Immature Granulocytes: 0 %
Lymphocytes Absolute: 1.8 10*3/uL (ref 0.7–3.1)
Lymphs: 21 %
MCH: 29.2 pg (ref 26.6–33.0)
MCHC: 32.8 g/dL (ref 31.5–35.7)
MCV: 89 fL (ref 79–97)
Monocytes Absolute: 0.6 10*3/uL (ref 0.1–0.9)
Monocytes: 7 %
Neutrophils Absolute: 6.2 10*3/uL (ref 1.4–7.0)
Neutrophils: 70 %
Platelets: 300 10*3/uL (ref 150–450)
RBC: 4.56 x10E6/uL (ref 3.77–5.28)
RDW: 12.7 % (ref 11.7–15.4)
WBC: 8.8 10*3/uL (ref 3.4–10.8)

## 2021-07-25 LAB — TSH: TSH: 1 u[IU]/mL (ref 0.450–4.500)

## 2021-07-25 NOTE — Progress Notes (Signed)
Contacted via [place] afternoon Francys, your labs have returned normal.  If any questions let me know. Keep being amazing!!  Thank you for allowing me to participate in your care.  I appreciate you. Kindest regards, Yarrow Linhart

## 2021-07-28 ENCOUNTER — Ambulatory Visit: Admitting: Nurse Practitioner

## 2021-08-04 ENCOUNTER — Other Ambulatory Visit: Payer: Self-pay

## 2021-08-04 ENCOUNTER — Ambulatory Visit (INDEPENDENT_AMBULATORY_CARE_PROVIDER_SITE_OTHER): Admitting: Certified Nurse Midwife

## 2021-08-04 ENCOUNTER — Encounter: Payer: Self-pay | Admitting: Certified Nurse Midwife

## 2021-08-04 VITALS — BP 106/68 | HR 74 | Ht 63.0 in | Wt 143.7 lb

## 2021-08-04 DIAGNOSIS — R1031 Right lower quadrant pain: Secondary | ICD-10-CM | POA: Diagnosis not present

## 2021-08-04 DIAGNOSIS — Z8742 Personal history of other diseases of the female genital tract: Secondary | ICD-10-CM

## 2021-08-04 DIAGNOSIS — N9489 Other specified conditions associated with female genital organs and menstrual cycle: Secondary | ICD-10-CM

## 2021-08-04 MED ORDER — MEDROXYPROGESTERONE ACETATE 10 MG PO TABS: 10.0000 mg | ORAL_TABLET | Freq: Every day | ORAL | 11 refills | Status: DC

## 2021-08-04 NOTE — Progress Notes (Signed)
GYN ENCOUNTER NOTE  Subjective:       Stephanie Logan is a 30 y.o. [phone] female is here for gynecologic evaluation of the following issues:  Right lower quadrant pain  History PCOS-irreg periods Hormonal changes, acne, mood , fatigue .     Gynecologic History Patient's last menstrual period was 07/27/2021 (exact date). Contraception:  coitus interruptus  Last Pap: 05/31/2019. Results were: normal/neg HPV Last mammogram: n/a .   Obstetric History OB History  Gravida Para Term Preterm AB Living  5 2 1 1 1 3   SAB IAB Ectopic Multiple Live Births  1 0 0 0 3    # Outcome Date GA Lbr Len/2nd Weight Sex Delivery Anes PTL Lv  5 Term 08/31/18 [redacted]w[redacted]d  8 lb 0.8 oz (3.65 kg) M Vag-Spont EPI  LIV  4 Preterm 02/07/17 [redacted]w[redacted]d  6 lb (2.722 kg) F  EPI  FD  3 Gravida 01/27/15    M Vag-Spont   LIV  2 SAB 01/2014        ND  1 Gravida 01/19/13    M Vag-Spont   LIV     Complications: Anal sphincter tear in delivery without 3rd degree perineal laceration    Past Medical History:  Diagnosis Date   Amenorrhea    Anxiety    PCOS (polycystic ovarian syndrome)    Post partum depression     Past Surgical History:  Procedure Laterality Date   BREAST EXCISIONAL BIOPSY Right 2018   fibroadenoma   BREAST EXCISIONAL BIOPSY Left 2010   fibroadenoma   BREAST FIBROADENOMA SURGERY  2008,2010   EXTRACORPOREAL SHOCK WAVE LITHOTRIPSY Right 05/16/2020   Procedure: EXTRACORPOREAL SHOCK WAVE LITHOTRIPSY (ESWL);  Surgeon: Abbie Sons, MD;  Location: [facility] ORS;  Service: Urology;  Laterality: Right;    Current Outpatient Medications on File Prior to Visit  Medication Sig Dispense Refill   ALPRAZolam (XANAX) 0.5 MG tablet Take 1 tablet (0.5 mg total) by mouth 2 (two) times daily as needed for anxiety. 60 tablet 2   docusate sodium (COLACE) 100 MG capsule Take 100 mg by mouth 3 (three) times daily.     meloxicam (MOBIC) 7.5 MG tablet Take 1 tablet (7.5 mg total) by mouth daily. 90 tablet 2   polyethylene  glycol (MIRALAX / GLYCOLAX) 17 g packet Take by mouth daily.     rizatriptan (MAXALT) 5 MG tablet Take 1 tablet (5 mg total) by mouth as needed for migraine. May repeat in 2 hours if needed 10 tablet 4   sertraline (ZOLOFT) 100 MG tablet Take 1 tablet (100 mg total) by mouth at bedtime. 90 tablet 4   ascorbic acid (VITAMIN C) 500 MG tablet Take 500 mg by mouth daily. (Patient not taking: Reported on 08/04/2021)     thiamine (VITAMIN B-1) 100 MG tablet Take 100 mg by mouth daily. (Patient not taking: Reported on 08/04/2021)     No current facility-administered medications on file prior to visit.    Allergies  Allergen Reactions   Mucinex Dm [Dm-Guaifenesin Er] Hives and Swelling   Ciprofloxacin Rash    Social History   Socioeconomic History   Marital status: Married    Spouse name: Donna Christen   Number of children: 3   Years of education: Not on file   Highest education level: Not on file  Occupational History   Not on file  Tobacco Use   Smoking status: Never   Smokeless tobacco: Never  Vaping Use   Vaping Use: Never used  Substance and Sexual Activity   Alcohol use: Yes    Comment: occas   Drug use: No   Sexual activity: Yes    Birth control/protection: Condom    Comment: undecided  Other Topics Concern   Not on file  Social History Narrative   All boys at home -- age 32, 52, 2.  Is going back at [employer].   Social Determinants of Health   Financial Resource Strain: Low Risk    Difficulty of Paying Living Expenses: Not hard at all  Food Insecurity: No Food Insecurity   Worried About Charity fundraiser in the Last Year: Never true   [place] in the Last Year: Never true  Transportation Needs: No Transportation Needs   Lack of Transportation (Medical): No   Lack of Transportation (Non-Medical): No  Physical Activity: Sufficiently Active   Days of Exercise per Week: 5 days   Minutes of Exercise per Session: 40 min  Stress: Stress Concern Present   Feeling of Stress  : To some extent  Social Connections: Moderately Isolated   Frequency of Communication with Friends and Family: More than three times a week   Frequency of Social Gatherings with Friends and Family: More than three times a week   Attends Religious Services: Never   Marine scientist or Organizations: No   Attends Music therapist: Never   Marital Status: Married  Human resources officer Violence: Not At Risk   Fear of Current or Ex-Partner: No   Emotionally Abused: No   Physically Abused: No   Sexually Abused: No    Family History  Problem Relation Age of Onset   Cancer Mother        cervical, precancerous   Cancer Father        Colon, Precancerous   Seizures Sister    Lung cancer Sister    Heart Problems Sister    Hashimoto's thyroiditis Brother    Learning disabilities Son    Cancer Maternal Grandmother        brain   Healthy Maternal Grandfather    Healthy Paternal Grandmother    Aneurysm Paternal Grandfather        Brain   Breast cancer Neg Hx     The following portions of the patient's history were reviewed and updated as appropriate: allergies, current medications, past family history, past medical history, past social history, past surgical history and problem list.  Review of Systems Review of Systems - Negative except as mentioned in HPI Review of Systems - General ROS: negative for - chills, fatigue, fever, hot flashes, malaise or night sweats Hematological and Lymphatic ROS: negative for - bleeding problems or swollen lymph nodes Gastrointestinal ROS: negative for - abdominal pain, blood in stools, change in bowel habits and nausea/vomiting Musculoskeletal ROS: negative for - joint pain, muscle pain or muscular weakness Genito-Urinary ROS: negative for - change in menstrual cycle, dysmenorrhea, dyspareunia, dysuria, genital discharge, genital ulcers, hematuria, incontinence, irregular/heavy menses, nocturia or pelvic painjj  Objective:   BP 106/68    Pulse 74   Ht 5\' 3"  (1.6 m)   Wt 143 lb 11.2 oz (65.2 kg)   LMP 07/27/2021 (Exact Date)   BMI 25.46 kg/m  CONSTITUTIONAL: Well-developed, well-nourished female in no acute distress.  HENT:  Normocephalic, atraumatic.  NECK: Normal range of motion, supple, no masses.  Normal thyroid.  SKIN: Skin is warm and dry. No rash noted. Not diaphoretic. No erythema. No pallor. [place]: Alert and oriented  to person, place, and time. PSYCHIATRIC: Normal mood and affect. Normal behavior. Normal judgment and thought content. CARDIOVASCULAR:Not Examined RESPIRATORY: Not Examined BREASTS: Not Examined ABDOMEN: Soft, non distended; Non tender.  No Organomegaly. PELVIC: not indicated  MUSCULOSKELETAL: Normal range of motion. No tenderness.  No cyanosis, clubbing, or edema.  CT 07/24/21 Prominent periuterine vascularity with mild dilatation of the gonadal veins, both right and left ovarian vein measuring 8 mm. No enlarged lymph nodes in the abdomen or pelvis. Reproductive: Mildly prominent periuterine and adnexal vascularity. Anteverted uterus. There is a 15 mm peripherally enhancing corpus luteal cyst in the left ovary, considered physiologic in a patient of this age. Normal CT appearance of the right ovary. No adnexal mass.   Assessment:   Right lower quadrant pain   Plan:   Congestive Pelvic syndrome reviewed discussed self help measures and surgical treatment options. Pamphlet for [facility] given. Referral placed. Discussed PCOS and symptoms . Discussed use of progesterone with OCP, IUD or progesterone. Pt request progesterone only option . Orders place.  PCOS labs completed today. TSH, FSH/LH, Prolactin level, testosterone.   Pt to follow up prn .   Philip Aspen, CNM

## 2021-08-08 LAB — TESTOSTERONE,FREE AND TOTAL
Testosterone, Free: 3.2 pg/mL (ref 0.0–4.2)
Testosterone: 45 ng/dL (ref 8–60)

## 2021-08-08 LAB — THYROID PANEL WITH TSH
Free Thyroxine Index: 1.5 (ref 1.2–4.9)
T3 Uptake Ratio: 26 % (ref 24–39)
T4, Total: 5.7 ug/dL (ref 4.5–12.0)
TSH: 2.47 u[IU]/mL (ref 0.450–4.500)

## 2021-08-08 LAB — PROLACTIN: Prolactin: 9.8 ng/mL (ref 4.8–23.3)

## 2021-08-08 LAB — FSH/LH
FSH: 7.4 m[IU]/mL
LH: 12.6 m[IU]/mL

## 2021-09-20 ENCOUNTER — Telehealth: Admitting: Physician Assistant

## 2021-09-20 DIAGNOSIS — B9689 Other specified bacterial agents as the cause of diseases classified elsewhere: Secondary | ICD-10-CM

## 2021-09-20 DIAGNOSIS — J019 Acute sinusitis, unspecified: Secondary | ICD-10-CM

## 2021-09-20 MED ORDER — AMOXICILLIN-POT CLAVULANATE 875-125 MG PO TABS: 1.0000 | ORAL_TABLET | Freq: Two times a day (BID) | ORAL | 0 refills | Status: DC

## 2021-09-20 NOTE — Progress Notes (Signed)

## 2021-09-20 NOTE — Progress Notes (Signed)
I have spent 5 minutes in review of e-visit questionnaire, review and updating patient chart, medical decision making and response to patient.   Laveah Gloster Cody Aser Nylund, PA-C    

## 2021-09-23 ENCOUNTER — Ambulatory Visit: Admitting: Nurse Practitioner

## 2021-10-20 ENCOUNTER — Ambulatory Visit: Payer: TRICARE For Life (TFL) | Admitting: Nurse Practitioner

## 2021-10-23 ENCOUNTER — Encounter: Payer: Self-pay | Admitting: Certified Nurse Midwife

## 2021-10-24 HISTORY — PX: ABDOMINAL SURGERY: SHX537

## 2021-11-06 ENCOUNTER — Ambulatory Visit (INDEPENDENT_AMBULATORY_CARE_PROVIDER_SITE_OTHER): Admitting: Nurse Practitioner

## 2021-11-06 ENCOUNTER — Encounter: Payer: Self-pay | Admitting: Nurse Practitioner

## 2021-11-06 ENCOUNTER — Other Ambulatory Visit: Payer: Self-pay

## 2021-11-06 VITALS — BP 101/63 | HR 80 | Temp 98.7°F | Ht 63.0 in | Wt 146.2 lb

## 2021-11-06 DIAGNOSIS — R41840 Attention and concentration deficit: Secondary | ICD-10-CM

## 2021-11-06 DIAGNOSIS — F411 Generalized anxiety disorder: Secondary | ICD-10-CM

## 2021-11-06 DIAGNOSIS — R1011 Right upper quadrant pain: Secondary | ICD-10-CM

## 2021-11-06 DIAGNOSIS — Z79899 Other long term (current) drug therapy: Secondary | ICD-10-CM | POA: Diagnosis not present

## 2021-11-06 DIAGNOSIS — F332 Major depressive disorder, recurrent severe without psychotic features: Secondary | ICD-10-CM

## 2021-11-06 DIAGNOSIS — N9489 Other specified conditions associated with female genital organs and menstrual cycle: Secondary | ICD-10-CM

## 2021-11-06 MED ORDER — LUBIPROSTONE 8 MCG PO CAPS: 8.0000 ug | ORAL_CAPSULE | Freq: Two times a day (BID) | ORAL | 4 refills | Status: DC

## 2021-11-06 MED ORDER — ALPRAZOLAM 0.5 MG PO TABS: 0.5000 mg | ORAL_TABLET | Freq: Two times a day (BID) | ORAL | 2 refills | Status: DC | PRN

## 2021-11-06 MED ORDER — SERTRALINE HCL 100 MG PO TABS: 150.0000 mg | ORAL_TABLET | Freq: Every day | ORAL | 4 refills | Status: DC

## 2021-11-06 NOTE — Assessment & Plan Note (Signed)
Educated at length on risks of long term use -- refer to anxiety plan.  UDS and controlled subs contract up to date July 2022.

## 2021-11-06 NOTE — Assessment & Plan Note (Signed)
Ongoing with constipation issues present.  Suspect some IBS-C with her underlying anxiety disorder, discussed with her.  She has tried OTC regimen without much benefit.  Trial Lubiprostone 8 MCG BID for this, will complete PA if needed.  Discussed with patient and educated her on medication.  Return in 6 weeks for follow-up, if ongoing then repeat labs and may need referral to GI.

## 2021-11-06 NOTE — Progress Notes (Signed)
BP 101/63    Pulse 80    Temp 98.7 F (37.1 C) (Oral)    Ht 5\' 3"  (1.6 m)    Wt 146 lb 3.2 oz (66.3 kg)    SpO2 97%    BMI 25.90 kg/m    Subjective:    Patient ID: Stephanie Logan, female    DOB: [birth year], 31 y.o.   MRN: [id number]  HPI: Stephanie Logan is a 31 y.o. female  Chief Complaint  Patient presents with   Mood    Patient is here to follow up on Mood. Patient states she is still doing about the same meaning she has not gotten any better or any worse. Patient states her current medication she feels is keeping her stable and about the same.    ANXIETY/STRESS Currently taking Zoloft 100 MG daily and Xanax as needed 0.5 MG PRN.  Xanax she uses about 1-3 times a week, often at night.  Does not take Xanax during week days due to fatigue with this.  Has more seasonal depression.  Is not sleeping well, if can have a more managed sleep her anxiety is improved.  Taken Trazodone in past and this made her feel very groggy.  Is not taking any Melatonin at this time.  Has been on this regimen for 6 + years started by OB/GYN. Has struggled with anxiety/depression her whole life, this worsened after 2nd child was born.  Her husband is double amputee, lost legs in [place].  Has history of stillborn child.  A referral to psychiatry was placed on 04/16/21 for further evaluation and assessment for ADD -- she has not attended and needs new referral.   Pt is aware of risks of benzo medication use to include increased sedation, respiratory suppression, falls, dependence and cardiovascular events.  Pt would like to continue treatment as benefit determined to outweigh risk.      Has attended therapy in past, did find helpful. Duration:stable Anxious mood: baseline Excessive worrying: baseline Irritability: yes  Sweating: no Nausea:  with anxiety Palpitations: occasional Hyperventilation:  occasional Panic attacks: yes Agoraphobia: no  Obscessions/compulsions: yes -- future plans,  house Depressed mood: occasional Depression screen [facility] 2/9 11/06/2021 07/21/2021 06/23/2021 04/16/2021 05/31/2019  Decreased Interest 1 0 1 1 2   Down, Depressed, Hopeless 2 0 1 1 2   PHQ - 2 Score 3 0 2 2 4   Altered sleeping 3 3 2 2 2   Tired, decreased energy 3 3 3 3 2   Change in appetite 1 0 0 3 2  Feeling bad or failure about yourself  1 1 1 2  0  Trouble concentrating 3 3 3 3 3   Moving slowly or fidgety/restless 0 0 0 1 0  Suicidal thoughts 0 0 0 0 0  PHQ-9 Score 14 10 11 16 13   Difficult doing work/chores - Very difficult Somewhat difficult Very difficult Very difficult  Some recent data might be hidden  Anhedonia: no Weight changes: no Insomnia: yes hard to stay asleep  Hypersomnia: no Fatigue/loss of energy: yes Feelings of worthlessness: yes Feelings of guilt: yes Impaired concentration/indecisiveness: yes Suicidal ideations: no  Crying spells: yes Recent Stressors/Life Changes: yes   Relationship problems: no   Family stress: yes  -- poor relationship with mother   Financial stress: no    Job stress: none   Recent death/loss: no  GAD 7 : Generalized Anxiety Score 11/06/2021 07/21/2021 06/23/2021 04/16/2021  Nervous, Anxious, on Edge 3 3 1 3   Control/stop worrying 3 3 1  3  Worry too much - different things 3 3 2 3   Trouble relaxing 3 3 3 3   Restless 2 3 1 2   Easily annoyed or irritable 3 3 3 3   Afraid - awful might happen 3 3 0 2  Total GAD 7 Score 20 21 11 19   Anxiety Difficulty Very difficult Extremely difficult Somewhat difficult Extremely difficult   ABDOMINAL PAIN (RUQ) Had CT of abdomen on 07/24/21 for ongoing abdominal pain -- was noted to have pelvic congestion and moderate stool burden.  Saw OB/GYN on 08/04/21 -- was sent to [facility] and surgery was performed per patient report, which she states has did offer benefit, but is still having some abdominal discomfort.  Is taking occasional fiber at home.  Her sister has IBS-D. Duration:days Onset:  sudden Severity: 3/10 Quality: aching throbbing Location:  RLQ  Episode duration:  Radiation:  none Frequency: intermittent Alleviating factors: fiber Aggravating factors: nothing Status: fluctuating Treatments attempted:  Colace and Miralax, Tylenol Fever: no Nausea: yes Vomiting: no Weight loss: no Decreased appetite: yes Diarrhea:  loose stool with Colace Constipation: yes -- sometimes no BM for a few days Blood in stool: no Heartburn: no Jaundice: no Rash: no Dysuria/urinary frequency: no Hematuria: no History of sexually transmitted disease: no Recurrent NSAID use: no    Relevant past medical, surgical, family and social history reviewed and updated as indicated. Interim medical history since our last visit reviewed. Allergies and medications reviewed and updated.  Review of Systems  Constitutional:  Negative for activity change, appetite change, diaphoresis, fatigue and fever.  Respiratory:  Negative for cough, chest tightness, shortness of breath and wheezing.   Cardiovascular:  Negative for chest pain, palpitations and leg swelling.  Gastrointestinal:  Positive for abdominal pain. Negative for abdominal distention, blood in stool, constipation, diarrhea, nausea and vomiting.  Endocrine: Negative.   Neurological: Negative.   Psychiatric/Behavioral: Negative.     Per HPI unless specifically indicated above     Objective:    BP 101/63    Pulse 80    Temp 98.7 F (37.1 C) (Oral)    Ht 5\' 3"  (1.6 m)    Wt 146 lb 3.2 oz (66.3 kg)    SpO2 97%    BMI 25.90 kg/m   Wt Readings from Last 3 Encounters:  11/06/21 146 lb 3.2 oz (66.3 kg)  08/04/21 143 lb 11.2 oz (65.2 kg)  07/24/21 140 lb 9.6 oz (63.8 kg)    Physical Exam Vitals and nursing note reviewed.  Constitutional:      General: She is awake. She is not in acute distress.    Appearance: She is well-developed and well-groomed. She is not ill-appearing or toxic-appearing.  HENT:     Head: Normocephalic.      Right Ear: Hearing normal.     Left Ear: Hearing normal.  Eyes:     General: Lids are normal.        Right eye: No discharge.        Left eye: No discharge.     Conjunctiva/sclera: Conjunctivae normal.     Pupils: Pupils are equal, round, and reactive to light.  Neck:     Thyroid: No thyromegaly.     Vascular: No carotid bruit.  Cardiovascular:     Rate and Rhythm: Normal rate and regular rhythm.     Heart sounds: Normal heart sounds. No murmur heard.   No gallop.  Pulmonary:     Effort: Pulmonary effort is normal. No accessory muscle  usage or respiratory distress.     Breath sounds: Normal breath sounds.  Abdominal:     General: Bowel sounds are normal. There is no distension.     Palpations: Abdomen is soft. There is no hepatomegaly.     Tenderness: There is abdominal tenderness in the right upper quadrant. There is no right CVA tenderness or left CVA tenderness.  Musculoskeletal:     Cervical back: Normal range of motion and neck supple.     Right lower leg: No edema.     Left lower leg: No edema.  Lymphadenopathy:     Cervical: No cervical adenopathy.  Skin:    General: Skin is warm and dry.  Neurological:     Mental Status: She is alert and oriented to person, place, and time.     Motor: Motor function is intact.     Coordination: Coordination is intact.     Gait: Gait is intact.     Deep Tendon Reflexes: Reflexes are normal and symmetric.     Reflex Scores:      Brachioradialis reflexes are 2+ on the right side and 2+ on the left side.      Patellar reflexes are 2+ on the right side and 2+ on the left side. Psychiatric:        Attention and Perception: Attention normal.        Mood and Affect: Mood normal.        Speech: Speech normal.        Behavior: Behavior normal. Behavior is cooperative.        Thought Content: Thought content normal.    Results for orders placed or performed in visit on 08/04/21  FSH/LH  Result Value Ref Range   LH 12.6 mIU/mL   FSH 7.4  mIU/mL  Thyroid Panel With TSH  Result Value Ref Range   TSH 2.470 0.450 - 4.500 uIU/mL   T4, Total 5.7 4.5 - 12.0 ug/dL   T3 Uptake Ratio 26 24 - 39 %   Free Thyroxine Index 1.5 1.2 - 4.9  Prolactin  Result Value Ref Range   Prolactin 9.8 4.8 - 23.3 ng/mL  Testosterone,Free and Total  Result Value Ref Range   Testosterone 45 8 - 60 ng/dL   Testosterone, Free 3.2 0.0 - 4.2 pg/mL      Assessment & Plan:   Problem List Items Addressed This Visit       Cardiovascular and Mediastinum   Pelvic congestive syndrome    Improved at this time after surgical intervention.      Relevant Medications   sertraline (ZOLOFT) 100 MG tablet     Other   Generalized anxiety disorder    Refer to depression plan of care for further.      Relevant Medications   sertraline (ZOLOFT) 100 MG tablet   ALPRAZolam (XANAX) 0.5 MG tablet   Other Relevant Orders   Ambulatory referral to Psychiatry   Lack of concentration    New referral placed to [facility].      Long term prescription benzodiazepine use    Educated at length on risks of long term use -- refer to anxiety plan.  UDS and controlled subs contract up to date July 2022.      RUQ abdominal pain    Ongoing with constipation issues present.  Suspect some IBS-C with her underlying anxiety disorder, discussed with her.  She has tried OTC regimen without much benefit.  Trial Lubiprostone 8 MCG BID for this,  will complete PA if needed.  Discussed with patient and educated her on medication.  Return in 6 weeks for follow-up, if ongoing then repeat labs and may need referral to GI.      Severe episode of recurrent major depressive disorder, without psychotic features ([place]) - Primary    Chronic, exacerbated.  Denies SI/HI.  At this time will increase Zoloft to 150 MG daily and maintain Xanax as ordered, has been on for 6 years and does not want to change.  Discussed at length that she may benefit from a change to Celexa or  Paxil, which may offer more anxiety benefit, but she wishes to maintain at this time.  Recommend return to therapy, even if virtual (ie. [facility]).  Return in 5 weeks.       Relevant Medications   sertraline (ZOLOFT) 100 MG tablet   ALPRAZolam (XANAX) 0.5 MG tablet     Follow up plan: Return in about 5 weeks (around 12/11/2021) for Constipation.

## 2021-11-06 NOTE — Assessment & Plan Note (Signed)
New referral placed to [facility].

## 2021-11-06 NOTE — Assessment & Plan Note (Signed)
Refer to depression plan of care for further. 

## 2021-11-06 NOTE — Assessment & Plan Note (Signed)
Improved at this time after surgical intervention.

## 2021-11-06 NOTE — Patient Instructions (Signed)

## 2021-11-06 NOTE — Assessment & Plan Note (Addendum)
Chronic, exacerbated.  Denies SI/HI.  At this time will increase Zoloft to 150 MG daily and maintain Xanax as ordered, has been on for 6 years and does not want to change.  Discussed at length that she may benefit from a change to Celexa or Paxil, which may offer more anxiety benefit, but she wishes to maintain at this time.  Recommend return to therapy, even if virtual (ie. [facility]).  Return in 5 weeks.

## 2021-11-10 ENCOUNTER — Encounter: Payer: Self-pay | Admitting: Nurse Practitioner

## 2021-12-11 ENCOUNTER — Ambulatory Visit: Admitting: Nurse Practitioner

## 2021-12-27 NOTE — Patient Instructions (Incomplete)

## 2022-01-01 ENCOUNTER — Ambulatory Visit: Admitting: Nurse Practitioner

## 2022-01-11 NOTE — Patient Instructions (Incomplete)

## 2022-01-14 ENCOUNTER — Ambulatory Visit: Admitting: Nurse Practitioner

## 2022-01-19 ENCOUNTER — Other Ambulatory Visit: Payer: Self-pay | Admitting: Nurse Practitioner

## 2022-01-20 NOTE — Telephone Encounter (Signed)
Requested Prescriptions  ?Pending Prescriptions Disp Refills  ?? meloxicam (MOBIC) 7.5 MG tablet [Pharmacy Med Name: MELOXICAM 7.5MG TABLETS] 90 tablet 0  ?  Sig: TAKE 1 TABLET(7.5 MG) BY MOUTH DAILY  ?  ? Analgesics:  COX2 Inhibitors Failed - 01/19/2022  3:43 AM  ?  ?  Failed - Manual Review: Labs are only required if the patient has taken medication for more than 8 weeks.  ?  ?  Passed - HGB in normal range and within 360 days  ?  Hemoglobin  ?Date Value Ref Range Status  ?07/24/2021 13.3 11.1 - 15.9 g/dL Final  ?   ?  ?  Passed - Cr in normal range and within 360 days  ?  Creatinine  ?Date Value Ref Range Status  ?04/16/2021 81.9 20.0 - 300.0 mg/dL Final  ? ?Creatinine, Ser  ?Date Value Ref Range Status  ?07/24/2021 0.82 0.57 - 1.00 mg/dL Final  ?   ?  ?  Passed - HCT in normal range and within 360 days  ?  Hematocrit  ?Date Value Ref Range Status  ?07/24/2021 40.6 34.0 - 46.6 % Final  ?   ?  ?  Passed - AST in normal range and within 360 days  ?  AST  ?Date Value Ref Range Status  ?07/24/2021 20 0 - 40 IU/L Final  ?   ?  ?  Passed - ALT in normal range and within 360 days  ?  ALT  ?Date Value Ref Range Status  ?07/24/2021 13 0 - 32 IU/L Final  ?   ?  ?  Passed - eGFR is 30 or above and within 360 days  ?  GFR calc Af Amer  ?Date Value Ref Range Status  ?03/16/2020 >60 >60 mL/min Final  ? ?GFR calc non Af Amer  ?Date Value Ref Range Status  ?03/16/2020 >60 >60 mL/min Final  ? ?eGFR  ?Date Value Ref Range Status  ?07/24/2021 97 >59 mL/min/1.73 Final  ?   ?  ?  Passed - Patient is not pregnant  ?  ?  Passed - Valid encounter within last 12 months  ?  Recent Outpatient Visits   ?      ? 2 months ago Severe episode of recurrent major depressive disorder, without psychotic features ([place])  ? [place], [place] T, NP  ? 6 months ago RLQ abdominal pain  ? [place], [place] T, NP  ? 6 months ago RLQ abdominal pain  ? [facility] V[place], Avanti, MD  ? 7 months ago  Severe episode of recurrent major depressive disorder, without psychotic features ([place])  ? [facility] [place], Henrine Screws T, NP  ? 9 months ago Encounter to establish care  ? [facility] [place], Henrine Screws T, NP  ?  ?  ?Future Appointments   ?        ? In 1 week [place], Barbaraann Faster, NP [employer]  ?  ? ?  ?  ?  ? ?

## 2022-01-21 ENCOUNTER — Emergency Department

## 2022-01-21 ENCOUNTER — Encounter: Payer: Self-pay | Admitting: Emergency Medicine

## 2022-01-21 ENCOUNTER — Ambulatory Visit: Payer: Self-pay

## 2022-01-21 ENCOUNTER — Other Ambulatory Visit: Payer: Self-pay

## 2022-01-21 ENCOUNTER — Emergency Department
Admission: EM | Admit: 2022-01-21 | Discharge: 2022-01-21 | Disposition: A | Discharge status: Home / Self Care | Attending: Emergency Medicine | Admitting: Emergency Medicine

## 2022-01-21 DIAGNOSIS — R079 Chest pain, unspecified: Secondary | ICD-10-CM | POA: Insufficient documentation

## 2022-01-21 DIAGNOSIS — R0602 Shortness of breath: Secondary | ICD-10-CM | POA: Insufficient documentation

## 2022-01-21 DIAGNOSIS — F410 Panic disorder [episodic paroxysmal anxiety] without agoraphobia: Secondary | ICD-10-CM

## 2022-01-21 DIAGNOSIS — F419 Anxiety disorder, unspecified: Secondary | ICD-10-CM

## 2022-01-21 LAB — CBC
HCT: 39.4 % (ref 36.0–46.0)
Hemoglobin: 13 g/dL (ref 12.0–15.0)
MCH: 28.7 pg (ref 26.0–34.0)
MCHC: 33 g/dL (ref 30.0–36.0)
MCV: 87 fL (ref 80.0–100.0)
Platelets: 254 10*3/uL (ref 150–400)
RBC: 4.53 MIL/uL (ref 3.87–5.11)
RDW: 12.8 % (ref 11.5–15.5)
WBC: 5.1 10*3/uL (ref 4.0–10.5)
nRBC: 0 % (ref 0.0–0.2)

## 2022-01-21 LAB — BASIC METABOLIC PANEL
Anion gap: 8 (ref 5–15)
BUN: 15 mg/dL (ref 6–20)
CO2: 28 mmol/L (ref 22–32)
Calcium: 9.7 mg/dL (ref 8.9–10.3)
Chloride: 101 mmol/L (ref 98–111)
Creatinine, Ser: 0.86 mg/dL (ref 0.44–1.00)
GFR, Estimated: >60 mL/min (ref >60)
Glucose, Bld: 97 mg/dL (ref 70–99)
Potassium: 3.7 mmol/L (ref 3.5–5.1)
Sodium: 137 mmol/L (ref 135–145)

## 2022-01-21 LAB — POC URINE PREG, ED: Preg Test, Ur: NEGATIVE

## 2022-01-21 LAB — TROPONIN I (HIGH SENSITIVITY): Troponin I (High Sensitivity): <2 ng/L (ref <18)

## 2022-01-21 MED ORDER — IOHEXOL 350 MG/ML SOLN
75.0000 mL | Freq: Once | INTRAVENOUS | Status: AC | PRN
  Administered 2022-01-21: 75 mL via INTRAVENOUS
  Filled 2022-01-21: qty 75

## 2022-01-21 NOTE — ED Provider Notes (Addendum)
31 year old female with history of anxiety presents with some chest pain and shortness of breath.  Patient had negative work-up and was currently pending CT angiogram to rule out PE. ? ?Physical Exam  ?BP (!) 118/55 (BP Location: Left Arm)   Pulse 66   Temp 98.4 ?F (36.9 ?C) (Oral)   Resp 18   Ht '5\' 3"'$  (1.6 m)   Wt 65.8 kg   SpO2 97%   BMI 25.69 kg/m?  ? ?Physical Exam ?Patient is alert and oriented.  No distress. ? ?Procedures  ?Procedures ? ?CLINICAL DATA:  Chest pain/shortness of breath ?  ?EXAM: ?CT ANGIOGRAPHY CHEST WITH CONTRAST ?  ?TECHNIQUE: ?Multidetector CT imaging of the chest was performed using the ?standard protocol during bolus administration of intravenous ?contrast. Multiplanar CT image reconstructions and MIPs were ?obtained to evaluate the vascular anatomy. ?  ?RADIATION DOSE REDUCTION: This exam was performed according to the ?departmental dose-optimization program which includes automated ?exposure control, adjustment of the mA and/or kV according to ?patient size and/or use of iterative reconstruction technique. ?  ?CONTRAST:  4m OMNIPAQUE IOHEXOL 350 MG/ML SOLN ?  ?COMPARISON:  None Available. ?  ?FINDINGS: ?Cardiovascular: No evidence of pulmonary embolus. Normal heart size. ?No pericardial effusion. Normal caliber thoracic aorta with no ?atherosclerotic disease. ?  ?Mediastinum/Nodes: Esophagus and thyroid are unremarkable no ?pathologically enlarged lymph nodes seen in the chest. ?  ?Lungs/Pleura: Central airways are patent. No consolidation, pleural ?effusion or pneumothorax. Mild bilateral mosaic attenuation which is ?likely due to air trapping. ?  ?Upper Abdomen: No acute abnormality. ?  ?Musculoskeletal: No chest wall abnormality. No acute or significant ?osseous findings. ?  ?Review of the MIP images confirms the above findings. ?  ?IMPRESSION: ?1. No evidence of pulmonary embolus or acute airspace opacity. ?2. Mild bilateral mosaic attenuation which is likely due to  air ?trapping, findings can be seen in the setting of small airways ?disease. ?  ?  ?ED Course / MDM  ?  ?Medical Decision Making ?Amount and/or Complexity of Data Reviewed ?Labs: ordered. ?Radiology: ordered. ? ?Risk ?Prescription drug management. ? ? ?31year old female with right upper chest pain, shortness of breath.  History of panic attacks.  Patient with normal blood work, negative troponin and EKG.  Vital signs are stable, afebrile with normal O2 sats.  A CT angiogram performed of the chest showed no sign of PE or any acute cardiopulmonary process.  Patient appears well and in no distress.  Patient stable and ready for discharge to home.  She has prescription for anxiety that she will take but prefers not to take it during the day as it causes her to not be able to function.  Patient understands signs and symptoms return to the ER for. ? ? ? ? ?  ?GDuanne Guess PA-C ?01/21/22 1631 ? ?  ?GDuanne Guess PA-C ?01/21/22 1631 ? ?  ?IDuffy Bruce MD ?01/26/22 1507 ? ?

## 2022-01-21 NOTE — Telephone Encounter (Signed)
Routing to provider as an Juluis Rainier, patient currently at ED.  ?

## 2022-01-21 NOTE — ED Triage Notes (Signed)
Pt via POV from home. Pt c/o centralized CP that radiates up the R side of her neck that started 2 weeks. Pt also endorses SOB that started this AM. Pt has a hx of PE in her family so she was concerned about that. Pt is A&OX4 and NAD.  ?

## 2022-01-21 NOTE — Telephone Encounter (Signed)
? ? ?  Chief Complaint: Chest pain, SOB ?Symptoms: Chest pain, right ride and middle. SOB. Heart "pounding with exertion." ?Frequency: Started 2 weeks ago. Having pain now. SOB started today. ?Pertinent Negatives: Patient denies  ?Disposition: '[x]'$ ED /'[]'$ Urgent Care (no appt availability in office) / '[]'$ Appointment(In office/virtual)/ '[]'$  [place] [facility]/ '[]'$ Home Care/ '[]'$ Refused Recommended Disposition /'[]'$ [place] [facility]/ '[]'$  Follow-up with PCP ?Additional Notes: Pt. Will go to ED.  ?Reason for Disposition ? Difficulty breathing ? ?Answer Assessment - Initial Assessment Questions ?1. LOCATION: "Where does it hurt?"   ?    Right side ?2. RADIATION: "Does the pain go anywhere else?" (e.g., into neck, jaw, arms, back) ?    Collar bone ?3. ONSET: "When did the chest pain begin?" (Minutes, hours or days)  ?    2 weeks ago ?4. PATTERN "Does the pain come and go, or has it been constant since it started?"  "Does it get worse with exertion?"  ?    Comes and goes ?5. DURATION: "How long does it last" (e.g., seconds, minutes, hours) ?    Lasts minutes ?6. SEVERITY: "How bad is the pain?"  (e.g., Scale 1-10; mild, moderate, or severe) ?   - MILD (1-3): doesn't interfere with normal activities  ?   - MODERATE (4-7): interferes with normal activities or awakens from sleep ?   - SEVERE (8-10): excruciating pain, unable to do any normal activities   ?    Now- 4 ?7. CARDIAC RISK FACTORS: "Do you have any history of heart problems or risk factors for heart disease?" (e.g., angina, prior heart attack; diabetes, high blood pressure, high cholesterol, smoker, or strong family history of heart disease) ?    No ?8. PULMONARY RISK FACTORS: "Do you have any history of lung disease?"  (e.g., blood clots in lung, asthma, emphysema, birth control pills) ?    No ?9. CAUSE: "What do you think is causing the chest pain?" ?    Unsure ?10. OTHER SYMPTOMS: "Do you have any other symptoms?" (e.g., dizziness, nausea, vomiting, sweating,  fever, difficulty breathing, cough) ?      SOB, right hip pain ?11. PREGNANCY: "Is there any chance you are pregnant?" "When was your last menstrual period?" ?      No ? ?Protocols used: Chest Pain-A-AH ? ?

## 2022-01-21 NOTE — ED Notes (Signed)
Pt. Discharged prior to RN assessment. 

## 2022-01-21 NOTE — ED Provider Notes (Signed)
? ?[facility] ?Provider Note ? ? ? Event Date/Time  ? First MD Initiated Contact with Patient 01/21/22 1450   ?  (approximate) ? ? ?History  ? ?Chest Pain and Shortness of Breath ? ? ?HPI ? ?Stephanie Logan is a 31 y.o. female to the ED with complaint of centralized chest pain that radiates up to the right side of her neck that started approximately 2 weeks ago.  Patient states that there has been no injury.  She has thought that it was related to anxiety.  Today she is anxious because she is now experienced some shortness of breath starting this morning.  She denies any previous history of asthma, PEs, shortness of breath, bronchitis or pneumonia.  Patient is worried because there is a family history of PEs.  She denies any recent travels, she is a non-smoker, no birth control being used.  She also reports sitting on the stretcher talking to me she is also short of breath at this time.  Patient does have a history of postpartum depression, PCOS and anxiety disorder. ?  ? ?  ? ? ?Physical Exam  ? ?Triage Vital Signs: ?ED Triage Vitals  ?Enc Vitals Group  ?   BP 01/21/22 1239 (!) 118/55  ?   Pulse Rate 01/21/22 1239 66  ?   Resp 01/21/22 1239 18  ?   Temp 01/21/22 1239 98.4 ?F (36.9 ?C)  ?   Temp Source 01/21/22 1239 Oral  ?   SpO2 01/21/22 1239 97 %  ?   Weight 01/21/22 1240 145 lb (65.8 kg)  ?   Height 01/21/22 1240 '5\' 3"'$  (1.6 m)  ?   Head Circumference --   ?   Peak Flow --   ?   Pain Score 01/21/22 1247 4  ?   Pain Loc --   ?   Pain Edu? --   ?   Excl. in [place]? --   ? ? ?Most recent vital signs: ?Vitals:  ? 01/21/22 1239  ?BP: (!) 118/55  ?Pulse: 66  ?Resp: 18  ?Temp: 98.4 ?F (36.9 ?C)  ?SpO2: 97%  ? ? ? ?General: Awake, no distress.  Patient is able to talk in complete sentences without any shortness of breath. ?CV:  Good peripheral perfusion.  Heart regular rate and rhythm. ?Resp:  Normal effort.  Lungs are clear bilaterally. ?Abd:  No distention.  ?  ? ? ?ED Results / Procedures /  Treatments  ? ?Labs ?(all labs ordered are listed, but only abnormal results are displayed) ?Labs Reviewed  ?BASIC METABOLIC PANEL  ?CBC  ?POC URINE PREG, ED  ?TROPONIN I (HIGH SENSITIVITY)  ? ? ? ?EKG ?Vent. rate 67 BPM ?PR interval 144 ms ?QRS duration 86 ms ?QT/QTcB 390/412 ms ?P-R-T axes 72 72 48 ?Normal sinus rhythm ?Normal ECG ?When compared with ECG of 16-Mar-2020 ? ? ?RADIOLOGY ? ?Chest x-ray is negative for acute cardiopulmonary changes. ? ? ?PROCEDURES: ? ?Critical Care performed:  ? ?Procedures ? ? ?MEDICATIONS ORDERED IN ED: ?Medications  ?iohexol (OMNIPAQUE) 350 MG/ML injection 75 mL (75 mLs Intravenous Contrast Given 01/21/22 1536)  ? ? ? ?IMPRESSION / MDM / ASSESSMENT AND PLAN / ED COURSE  ?I reviewed the triage vital signs and the nursing notes. ? ? ?Differential diagnosis includes, but is not limited to, chest pain, muscle skeletal pain, pneumonia, pulmonary embolus, anxiety. ? ?31 year old female presents to the ED with history of chest pain for approximately 2 weeks.  Patient denies any history  of injury.  She states that her chest pain radiates up to the right side of her neck.  Today she developed some shortness of breath without exertion that is a new symptom.  Patient is anxious as there is a history of PEs in her family.  She herself denies any history of DVTs, lengthy travel, smoking, or birth control/estrogen.  Work-up has included a pregnancy test which is negative, troponin was less than 2, BMP unremarkable and CBC was normal.  Chest x-ray was negative for acute cardiopulmonary disease.  We will go ahead and do a CT angio to rule out PE. ? ? ? ?----------------------------------------- ?3:52 PM on 01/21/2022 ?----------------------------------------- ? ?Blood pressure (!) 118/55, pulse 66, temperature 98.4 ?F (36.9 ?C), temperature source Oral, resp. rate 18, height '5\' 3"'$  (1.6 m), weight 65.8 kg, SpO2 97 %. ? ?Assuming care from [facility], PA-C.  In short, Stephanie Logan is a 31 y.o.  female with a chief complaint of Chest Pain and Shortness of Breath ?Marland Kitchen  Refer to the original H&P for additional details. ? ?The current plan of care is to wait for results of CT to r/o PE.  ? ? ?FINAL CLINICAL IMPRESSION(S) / ED DIAGNOSES  ? ?Final diagnoses:  ?Shortness of breath  ? ? ? ?Rx / DC Orders  ? ?ED Discharge Orders   ? ? None  ? ?  ? ? ? ?Note:  This document was prepared using Dragon voice recognition software and may include unintentional dictation errors. ?  ?Johnn Hai, PA-C ?01/21/22 1554 ? ?  ?Lavonia Drafts, MD ?01/24/22 [phone] ? ?

## 2022-01-25 NOTE — Patient Instructions (Signed)

## 2022-01-27 ENCOUNTER — Encounter: Payer: Self-pay | Admitting: Nurse Practitioner

## 2022-01-27 ENCOUNTER — Ambulatory Visit: Admitting: Nurse Practitioner

## 2022-01-27 VITALS — BP 88/57 | HR 61 | Temp 98.0°F | Ht 63.0 in | Wt 150.0 lb

## 2022-01-27 DIAGNOSIS — F332 Major depressive disorder, recurrent severe without psychotic features: Secondary | ICD-10-CM

## 2022-01-27 DIAGNOSIS — K5900 Constipation, unspecified: Secondary | ICD-10-CM | POA: Insufficient documentation

## 2022-01-27 DIAGNOSIS — Z79899 Other long term (current) drug therapy: Secondary | ICD-10-CM

## 2022-01-27 DIAGNOSIS — F411 Generalized anxiety disorder: Secondary | ICD-10-CM | POA: Diagnosis not present

## 2022-01-27 DIAGNOSIS — K5901 Slow transit constipation: Secondary | ICD-10-CM

## 2022-01-27 DIAGNOSIS — M25551 Pain in right hip: Secondary | ICD-10-CM

## 2022-01-27 MED ORDER — METHOCARBAMOL 750 MG PO TABS: 750.0000 mg | ORAL_TABLET | Freq: Three times a day (TID) | ORAL | 0 refills | Status: DC | PRN

## 2022-01-27 MED ORDER — BUSPIRONE HCL 5 MG PO TABS: 5.0000 mg | ORAL_TABLET | Freq: Two times a day (BID) | ORAL | 4 refills | Status: DC

## 2022-01-27 NOTE — Assessment & Plan Note (Signed)
Ongoing issue.  At this time recommend PT, but her schedule is full to pursue this.  Recommend TENS machine at home and continue current OTC products + gentle stretching.  ?related to pelvic congestion vs arthritic changes.  Will trial Robaxin as needed. ?

## 2022-01-27 NOTE — Assessment & Plan Note (Signed)
Improving at this time with daily fiber supplement, continue this and adjust as needed. ?

## 2022-01-27 NOTE — Assessment & Plan Note (Signed)
Educated at length on risks of long term use -- refer to anxiety plan.  UDS and controlled subs contract up to date July 2022. ?

## 2022-01-27 NOTE — Assessment & Plan Note (Signed)
Refer to depression plan of care for further. 

## 2022-01-27 NOTE — Assessment & Plan Note (Signed)
Chronic, exacerbated.  Denies SI/HI.  At this time will continue Zoloft 150 MG daily and maintain Xanax as ordered, has been on for 6 years and does not want to change. Will trial Buspar 5 MG BID, educated patient on this.  Recommend return to therapy, even if virtual (ie. [facility]).  Return in 6 weeks. ? ?

## 2022-01-27 NOTE — Progress Notes (Signed)
? ?BP (!) 88/57   Pulse 61   Temp 98 ?F (36.7 ?C) (Oral)   Ht '5\' 3"'$  (1.6 m)   Wt 150 lb (68 kg)   SpO2 98%   BMI 26.57 kg/m?   ? ?Subjective:  ? ? Patient ID: Stephanie Logan, female    DOB: Oct 14, 1990, 31 y.o.   MRN: [id number] ? ?HPI: ?Stephanie Logan is a 31 y.o. female ? ?Chief Complaint  ?Patient presents with  ? Constipation  ?  Patient is here to follow up on Constipation. Patient states she is doing much better.   ? ?CONSTIPATION ?Follow-up today for for constipation.  She had leftover sample bottle of Linzess at house from [place], she used this for 2 weeks and this worked well.  Has not needed anymore.  Could not obtain Lubiprostone due to insurance coverage.  Has added more fiber to diet which is helping.  Is taking fiber supplement pills. ? ?Had CT of abdomen on 07/24/21 for ongoing abdominal pain -- was noted to have pelvic congestion and moderate stool burden.  Saw OB/GYN on 08/04/21 -- was sent to [facility] and surgery was performed per patient report, which she states has did offer benefit.  Her sister has IBS-D. ?Duration:days ?Onset: sudden ?Severity: 3/10 ?Quality: aching throbbing ?Location:  RLQ  ?Episode duration:  ?Radiation:  none ?Frequency: intermittent ?Alleviating factors: fiber ?Aggravating factors: nothing ?Status: fluctuating ?Treatments attempted:  Colace and Miralax, Tylenol ?Fever: no ?Nausea: yes ?Vomiting: no ?Weight loss: no ?Decreased appetite: yes ?Diarrhea:  loose stool with Colace ?Constipation: yes -- sometimes no BM for a few days ?Blood in stool: no ?Heartburn: no ?Jaundice: no ?Rash: no ?Dysuria/urinary frequency: no ?Hematuria: no ?History of sexually transmitted disease: no ?Recurrent NSAID use: no  ? ?HIP PAIN ?To right hip where pain was when she had pelvic congestion issues.  Takes Tylenol + Ibuprofen every day without benefit.  Voltaren gel not helping, takes edge off only.  Chronic issue, but over past month has been worse.  Did play sports in school  and had falls of horses when young. ?Duration: chronic ?Involved hip: right  ?Mechanism of injury: unknown ?Location: diffuse ?Onset: gradual  ?Severity: 5/10  ?Quality: dull, aching, and throbbing ?Frequency: intermittent ?Radiation: no ?Aggravating factors: weight bearing, walking, stairs, and movement   ?Alleviating factors: nothing  ?Status: fluctuating ?Treatments attempted: APAP and ibuprofen , Voltaren gel ?Relief with NSAIDs?: mild ?Weakness with weight bearing: no ?Weakness with walking: no ?Paresthesias / decreased sensation: no ?Swelling: no ?Redness:no ?Fevers: no  ?  ? ?ANXIETY/STRESS ?Currently taking Zoloft 150 MG daily and Xanax as needed 0.5 MG PRN. Xanax she uses about 1-3 times a week, often at night.   ? ?Does not take Xanax during week days due to fatigue with this.  Has more seasonal depression.  Taken Trazodone in past and this made her feel very groggy.  Is not taking any Melatonin at this time. ?  ?Has been on this regimen for 6 + years started by OB/GYN. Has struggled with anxiety/depression her whole life, this worsened after 2nd child was born.  Her husband is double amputee, lost legs in [place]. Has history of stillborn child.  A referral to psychiatry was placed on 11/06/21 for further evaluation and assessment for ADD -- she has not attended as of yet. ?  ?Pt is aware of risks of benzo medication use to include increased sedation, respiratory suppression, falls, dependence and cardiovascular events.  Pt would like to continue treatment  as benefit determined to outweigh risk.    ?  ?Has attended therapy in past, did find helpful. ? ?  2022/02/01  ?  8:54 AM 11/06/2021  ? 11:14 AM 07/21/2021  ?  8:45 AM 06/23/2021  ?  1:46 PM 04/16/2021  ?  9:29 AM  ?Depression screen PHQ 2/9  ?Decreased Interest 1 1 0 1 1  ?Down, Depressed, Hopeless 2 2 0 1 1  ?PHQ - 2 Score 3 3 0 2 2  ?Altered sleeping '2 3 3 2 2  '$ ?Tired, decreased energy '3 3 3 3 3  '$ ?Change in appetite 2 1 0 0 3  ?Feeling bad or failure  about yourself  '3 1 1 1 2  '$ ?Trouble concentrating '2 3 3 3 3  '$ ?Moving slowly or fidgety/restless 0 0 0 0 1  ?Suicidal thoughts 0 0 0 0 0  ?PHQ-9 Score '15 14 10 11 16  '$ ?Difficult doing work/chores   Very difficult Somewhat difficult Very difficult  ? Anhedonia: no ?Weight changes: no ?Insomnia: yes hard to stay asleep  ?Hypersomnia: no ?Fatigue/loss of energy: yes ?Feelings of worthlessness: yes ?Feelings of guilt: yes ?Impaired concentration/indecisiveness: yes ?Suicidal ideations: no  ?Crying spells: yes ?Recent Stressors/Life Changes: yes ?  Relationship problems: no ?  Family stress: yes  -- poor relationship with mother ?  Financial stress: no  ?  Job stress: none ?  Recent death/loss: no  ? ?  02/01/2022  ?  8:54 AM 11/06/2021  ? 11:14 AM 07/21/2021  ?  8:46 AM 06/23/2021  ?  1:45 PM  ?GAD 7 : Generalized Anxiety Score  ?Nervous, Anxious, on Edge '3 3 3 1  '$ ?Control/stop worrying '3 3 3 1  '$ ?Worry too much - different things '3 3 3 2  '$ ?Trouble relaxing '3 3 3 3  '$ ?Restless '3 2 3 1  '$ ?Easily annoyed or irritable '3 3 3 3  '$ ?Afraid - awful might happen '3 3 3 '$ 0  ?Total GAD 7 Score '21 20 21 11  '$ ?Anxiety Difficulty Very difficult Very difficult Extremely difficult Somewhat difficult  ? ?  ? ?Relevant past medical, surgical, family and social history reviewed and updated as indicated. Interim medical history since our last visit reviewed. ?Allergies and medications reviewed and updated. ? ?Review of Systems  ?Constitutional:  Negative for activity change, appetite change, diaphoresis, fatigue and fever.  ?Respiratory:  Negative for cough, chest tightness, shortness of breath and wheezing.   ?Cardiovascular:  Negative for chest pain, palpitations and leg swelling.  ?Gastrointestinal: Negative.   ?Endocrine: Negative.   ?Musculoskeletal:  Positive for arthralgias.  ?Neurological: Negative.   ?Psychiatric/Behavioral:  Positive for sleep disturbance. Negative for decreased concentration, self-injury and suicidal ideas. The patient is  nervous/anxious.   ? ?Per HPI unless specifically indicated above ? ?   ?Objective:  ?  ?BP (!) 88/57   Pulse 61   Temp 98 ?F (36.7 ?C) (Oral)   Ht '5\' 3"'$  (1.6 m)   Wt 150 lb (68 kg)   SpO2 98%   BMI 26.57 kg/m?   ?Wt Readings from Last 3 Encounters:  ?Feb 01, 2022 150 lb (68 kg)  ?01/21/22 145 lb (65.8 kg)  ?11/06/21 146 lb 3.2 oz (66.3 kg)  ?  ?Physical Exam ?Vitals and nursing note reviewed.  ?Constitutional:   ?   General: She is awake. She is not in acute distress. ?   Appearance: She is well-developed and well-groomed. She is not ill-appearing or toxic-appearing.  ?HENT:  ?   Head:  Normocephalic.  ?   Right Ear: Hearing normal.  ?   Left Ear: Hearing normal.  ?Eyes:  ?   General: Lids are normal.     ?   Right eye: No discharge.     ?   Left eye: No discharge.  ?   Conjunctiva/sclera: Conjunctivae normal.  ?   Pupils: Pupils are equal, round, and reactive to light.  ?Neck:  ?   Thyroid: No thyromegaly.  ?   Vascular: No carotid bruit.  ?Cardiovascular:  ?   Rate and Rhythm: Normal rate and regular rhythm.  ?   Heart sounds: Normal heart sounds. No murmur heard. ?  No gallop.  ?Pulmonary:  ?   Effort: Pulmonary effort is normal. No accessory muscle usage or respiratory distress.  ?   Breath sounds: Normal breath sounds.  ?Abdominal:  ?   General: Bowel sounds are normal. There is no distension.  ?   Palpations: Abdomen is soft. There is no hepatomegaly.  ?   Tenderness: There is no abdominal tenderness. There is no right CVA tenderness or left CVA tenderness.  ?Musculoskeletal:  ?   Cervical back: Normal range of motion and neck supple.  ?   Right hip: No deformity, tenderness or bony tenderness. Normal range of motion. Normal strength.  ?   Left hip: Normal.  ?   Right lower leg: No edema.  ?   Left lower leg: No edema.  ?   Comments: Mild tenderness reported with valgus & varus right hip.  ?Lymphadenopathy:  ?   Cervical: No cervical adenopathy.  ?Skin: ?   General: Skin is warm and dry.  ?Neurological:  ?    Mental Status: She is alert and oriented to person, place, and time.  ?   Motor: Motor function is intact.  ?   Coordination: Coordination is intact.  ?   Gait: Gait is intact.  ?   Deep Tendon Reflexes: Reflexes a

## 2022-01-30 ENCOUNTER — Telehealth (HOSPITAL_BASED_OUTPATIENT_CLINIC_OR_DEPARTMENT_OTHER): Admitting: Physician Assistant

## 2022-01-30 DIAGNOSIS — J020 Streptococcal pharyngitis: Secondary | ICD-10-CM

## 2022-01-30 MED ORDER — AMOXICILLIN 500 MG PO CAPS: 500.0000 mg | ORAL_CAPSULE | Freq: Two times a day (BID) | ORAL | 0 refills | Status: AC

## 2022-01-30 NOTE — Progress Notes (Signed)

## 2022-02-21 ENCOUNTER — Emergency Department

## 2022-02-21 ENCOUNTER — Encounter: Payer: Self-pay | Admitting: Medical Oncology

## 2022-02-21 ENCOUNTER — Emergency Department
Admission: EM | Admit: 2022-02-21 | Discharge: 2022-02-21 | Disposition: A | Discharge status: Home / Self Care | Attending: Emergency Medicine | Admitting: Emergency Medicine

## 2022-02-21 DIAGNOSIS — R8289 Other abnormal findings on cytological and histological examination of urine: Secondary | ICD-10-CM | POA: Insufficient documentation

## 2022-02-21 DIAGNOSIS — N200 Calculus of kidney: Secondary | ICD-10-CM | POA: Diagnosis not present

## 2022-02-21 DIAGNOSIS — E876 Hypokalemia: Secondary | ICD-10-CM | POA: Diagnosis not present

## 2022-02-21 DIAGNOSIS — R1084 Generalized abdominal pain: Secondary | ICD-10-CM | POA: Diagnosis present

## 2022-02-21 DIAGNOSIS — R109 Unspecified abdominal pain: Secondary | ICD-10-CM

## 2022-02-21 DIAGNOSIS — R110 Nausea: Secondary | ICD-10-CM | POA: Insufficient documentation

## 2022-02-21 HISTORY — DX: Calculus of kidney: N20.0

## 2022-02-21 LAB — PREGNANCY, URINE: Preg Test, Ur: NEGATIVE

## 2022-02-21 LAB — CBC
HCT: 37.7 % (ref 36.0–46.0)
Hemoglobin: 12.5 g/dL (ref 12.0–15.0)
MCH: 28.9 pg (ref 26.0–34.0)
MCHC: 33.2 g/dL (ref 30.0–36.0)
MCV: 87.3 fL (ref 80.0–100.0)
Platelets: 234 10*3/uL (ref 150–400)
RBC: 4.32 MIL/uL (ref 3.87–5.11)
RDW: 12.9 % (ref 11.5–15.5)
WBC: 7.4 10*3/uL (ref 4.0–10.5)
nRBC: 0 % (ref 0.0–0.2)

## 2022-02-21 LAB — COMPREHENSIVE METABOLIC PANEL
ALT: 18 U/L (ref 0–44)
AST: 24 U/L (ref 15–41)
Albumin: 4.7 g/dL (ref 3.5–5.0)
Alkaline Phosphatase: 50 U/L (ref 38–126)
Anion gap: 8 (ref 5–15)
BUN: 11 mg/dL (ref 6–20)
CO2: 27 mmol/L (ref 22–32)
Calcium: 9.3 mg/dL (ref 8.9–10.3)
Chloride: 102 mmol/L (ref 98–111)
Creatinine, Ser: 0.64 mg/dL (ref 0.44–1.00)
GFR, Estimated: >60 mL/min (ref >60)
Glucose, Bld: 92 mg/dL (ref 70–99)
Potassium: 3.4 mmol/L — ABNORMAL LOW (ref 3.5–5.1)
Sodium: 137 mmol/L (ref 135–145)
Total Bilirubin: 0.9 mg/dL (ref 0.3–1.2)
Total Protein: 7.7 g/dL (ref 6.5–8.1)

## 2022-02-21 LAB — URINALYSIS, ROUTINE W REFLEX MICROSCOPIC
Bilirubin Urine: NEGATIVE
Glucose, UA: NEGATIVE mg/dL
Hgb urine dipstick: NEGATIVE
Ketones, ur: NEGATIVE mg/dL
Nitrite: NEGATIVE
Protein, ur: NEGATIVE mg/dL
Specific Gravity, Urine: 1.008 (ref 1.005–1.030)
pH: 6 (ref 5.0–8.0)

## 2022-02-21 LAB — LIPASE, BLOOD: Lipase: 24 U/L (ref 11–51)

## 2022-02-21 MED ORDER — ONDANSETRON HCL 4 MG/2ML IJ SOLN
4.0000 mg | Freq: Once | INTRAMUSCULAR | Status: AC | PRN
  Administered 2022-02-21: 4 mg via INTRAVENOUS
  Filled 2022-02-21: qty 2

## 2022-02-21 MED ORDER — SODIUM CHLORIDE 0.9 % IV BOLUS
1000.0000 mL | Freq: Once | INTRAVENOUS | Status: AC
  Administered 2022-02-21: 1000 mL via INTRAVENOUS

## 2022-02-21 MED ORDER — KETOROLAC TROMETHAMINE 15 MG/ML IJ SOLN
15.0000 mg | Freq: Once | INTRAMUSCULAR | Status: AC
  Administered 2022-02-21: 15 mg via INTRAVENOUS
  Filled 2022-02-21: qty 1

## 2022-02-21 MED ORDER — FENTANYL CITRATE PF 50 MCG/ML IJ SOSY
50.0000 ug | PREFILLED_SYRINGE | INTRAMUSCULAR | Status: DC | PRN
  Administered 2022-02-21: 50 ug via INTRAVENOUS
  Filled 2022-02-21 (×2): qty 1

## 2022-02-21 MED ORDER — NAPROXEN 250 MG PO TABS: 250.0000 mg | ORAL_TABLET | Freq: Two times a day (BID) | ORAL | 0 refills | Status: AC

## 2022-02-21 MED ORDER — HYDROMORPHONE HCL 1 MG/ML IJ SOLN
1.0000 mg | Freq: Once | INTRAMUSCULAR | Status: AC
  Administered 2022-02-21: 1 mg via INTRAVENOUS
  Filled 2022-02-21: qty 1

## 2022-02-21 MED ORDER — IBUPROFEN 400 MG PO TABS
400.0000 mg | ORAL_TABLET | Freq: Once | ORAL | Status: AC
  Administered 2022-02-21: 400 mg via ORAL
  Filled 2022-02-21: qty 1

## 2022-02-21 NOTE — ED Provider Notes (Signed)
[facility] Provider Note    Event Date/Time   First MD Initiated Contact with Patient 02/21/22 1611     (approximate)   History   Flank Pain and Abdominal Pain   HPI  Stephanie Logan is a 31 y.o. female past medical history of kidney stones presents with acute onset of left flank pain.  Started acutely several hours ago.  Pain is located in the left flank but has now become more diffuse and radiating across the entire abdomen.  Has nausea but no vomiting denies urinary symptoms.  Does have history of kidney stones but feels higher up in the back than normal.  No prior abdominal surgeries but has required lithotripsy for stones in the past.  Denies fevers or chills no pain with urination.    Past Medical History:  Diagnosis Date   Amenorrhea    Anxiety    Kidney stone    PCOS (polycystic ovarian syndrome)    Post partum depression     Patient Active Problem List   Diagnosis Date Noted   Constipation 01/27/2022   Pelvic congestive syndrome 08/04/2021   Elevated low density lipoprotein (LDL) cholesterol level 06/24/2021   Controlled substance agreement signed 04/16/2021   Long term prescription benzodiazepine use 04/16/2021   Lack of concentration 04/16/2021   Migraine with aura 04/16/2021   History of kidney stones 04/16/2021   History of abnormal mammogram 04/16/2021   Generalized anxiety disorder 04/12/2021   Liver hemangioma 08/01/2018   Vitamin B 12 deficiency 05/30/2018   History of IUFD 02/07/2017   Hip pain 02/18/2016   Chronic pain of right knee 02/18/2016   Severe episode of recurrent major depressive disorder, without psychotic features ([place])    PCOS (polycystic ovarian syndrome) 07/19/2015     Physical Exam  Triage Vital Signs: ED Triage Vitals [02/21/22 1456]  Enc Vitals Group     BP 113/78     Pulse Rate 81     Resp 18     Temp 97.8 F (36.6 C)     Temp Source Oral     SpO2 97 %     Weight 145 lb (65.8 kg)     Height  '5\' 3"'$  (1.6 m)     Head Circumference      Peak Flow      Pain Score 8     Pain Loc      Pain Edu?      Excl. in [place]?     Most recent vital signs: Vitals:   02/21/22 1655 02/21/22 1838  BP: (!) 120/92 113/75  Pulse:  73  Resp: (!) 22 18  Temp:    SpO2: 99% 96%     General: Awake, patient appears uncomfortable CV:  Good peripheral perfusion.  Resp:  Normal effort.  Abd:  No distention.  Mild tenderness to palpation in the left mid abdomen Neuro:             Awake, Alert, Oriented x 3  Other:  + Left CVA tenderness   ED Results / Procedures / Treatments  Labs (all labs ordered are listed, but only abnormal results are displayed) Labs Reviewed  COMPREHENSIVE METABOLIC PANEL - Abnormal; Notable for the following components:      Result Value   Potassium 3.4 (*)    All other components within normal limits  URINALYSIS, ROUTINE W REFLEX MICROSCOPIC - Abnormal; Notable for the following components:   Color, Urine YELLOW (*)    APPearance CLEAR (*)  Leukocytes,Ua TRACE (*)    All other components within normal limits  CBC  PREGNANCY, URINE  LIPASE, BLOOD     EKG     RADIOLOGY I reviewed the CT abdomen pelvis and interpreted it.  No acute pathology   PROCEDURES:  Critical Care performed: No  Procedures   MEDICATIONS ORDERED IN ED: Medications  fentaNYL (SUBLIMAZE) injection 50 mcg (50 mcg Intravenous Given 02/21/22 1504)  ibuprofen (ADVIL) tablet 400 mg (has no administration in time range)  ondansetron (ZOFRAN) injection 4 mg (4 mg Intravenous Given 02/21/22 1505)  sodium chloride 0.9 % bolus 1,000 mL (0 mLs Intravenous Stopped 02/21/22 1611)  HYDROmorphone (DILAUDID) injection 1 mg (1 mg Intravenous Given 02/21/22 1651)  ketorolac (TORADOL) 15 MG/ML injection 15 mg (15 mg Intravenous Given 02/21/22 1649)     IMPRESSION / MDM / ASSESSMENT AND PLAN / ED COURSE  I reviewed the triage vital signs and the nursing notes.                              Patient's  presentation is most consistent with acute complicated illness / injury requiring diagnostic workup.  Differential diagnosis includes, but is not limited to, kidney stone, pyelonephritis, ovarian torsion, pancreatitis  Patient is a 31 year old female history of kidney stones presents with acute onset of left flank pain.  She appears uncomfortable on my evaluation.  Pain is in the left CVA region and on abdominal exam she does have some tenderness to palpation the left upper to mid abdomen does note that pain radiates across the entire abdomen.  Her labs are reassuring, CBC without leukocytosis and CMP with normal kidney function just mild hypokalemia with potassium of 3.4.  She was given fentanyl and Zofran in triage.  Will give Dilaudid and Toradol.  I suspect that this is a kidney stone will obtain a CT renal study.  Patient still pending a UA and pregnant.  Patient's pregnancy test is negative.  UA just with trace leuks.  CBC CMP reassuring normal lipase.  Renal study has no acute findings.  There are stones in the kidney but no obvious ureteral stone.  Patient has prior gonadal vein intervention which causes some streak artifact limiting evaluation of the left ureter however there is no hydronephrosis to suggest an obstructing stone.  On reassessment patient is feeling much improved.  Considered other pelvic pathology like ovarian torsion however with normal adnexa on CT I think this is very unlikely.  Overall unclear what is causing her pain.  There was note some constipation on CT but unlikely to cause such significant pain.  Overall with no acute finding and patient feeling improved with normal labs I think she is appropriate for discharge.  Will prescribe naproxen for pain.  We discussed return precautions.  Advise she follow-up with urology.       FINAL CLINICAL IMPRESSION(S) / ED DIAGNOSES   Final diagnoses:  Left flank pain     Rx / DC Orders   ED Discharge Orders          Ordered     naproxen (NAPROSYN) 250 MG tablet  2 times daily with meals        02/21/22 1954             Note:  This document was prepared using Dragon voice recognition software and may include unintentional dictation errors.   Rada Hay, MD 02/21/22 [phone]

## 2022-02-21 NOTE — ED Notes (Signed)
Pt transported back to room from CT via W/C. 

## 2022-02-21 NOTE — ED Triage Notes (Signed)
Pt reports that she began having sudden left flank pain around to lower abd about 2 hrs PTA. Pt reports hx of kidney stones.

## 2022-02-21 NOTE — ED Notes (Signed)
This RN called lab requesting results for U/A.  Per lab, they are looking into it now.

## 2022-02-21 NOTE — Discharge Instructions (Addendum)
CAT scan did not show any obvious cause of your pain.  Your blood work and urine sample are also reassuring.  Please take the naproxen twice daily as needed for pain.  If your symptoms are worsening or you develop any new symptoms that are concerning to you please return to the emergency department.

## 2022-02-21 NOTE — ED Notes (Signed)
Pt got a "rush" right after pain meds. Pt stated she felt weird.

## 2022-02-24 ENCOUNTER — Other Ambulatory Visit: Payer: Self-pay | Admitting: Unknown Physician Specialty

## 2022-02-24 ENCOUNTER — Encounter: Payer: Self-pay | Admitting: Nurse Practitioner

## 2022-02-24 MED ORDER — SERTRALINE HCL 100 MG PO TABS: 150.0000 mg | ORAL_TABLET | Freq: Every day | ORAL | 1 refills | Status: DC

## 2022-02-27 ENCOUNTER — Encounter: Payer: Self-pay | Admitting: Physician Assistant

## 2022-02-27 ENCOUNTER — Ambulatory Visit (INDEPENDENT_AMBULATORY_CARE_PROVIDER_SITE_OTHER): Admitting: Physician Assistant

## 2022-02-27 VITALS — BP 95/66 | HR 71 | Ht 63.0 in | Wt 145.0 lb

## 2022-02-27 DIAGNOSIS — R109 Unspecified abdominal pain: Secondary | ICD-10-CM | POA: Diagnosis not present

## 2022-02-27 DIAGNOSIS — Z87442 Personal history of urinary calculi: Secondary | ICD-10-CM | POA: Diagnosis not present

## 2022-02-27 LAB — URINALYSIS, COMPLETE
Bilirubin, UA: NEGATIVE
Glucose, UA: NEGATIVE
Ketones, UA: NEGATIVE
Nitrite, UA: NEGATIVE
Protein,UA: NEGATIVE
RBC, UA: NEGATIVE
Specific Gravity, UA: 1.01 (ref 1.005–1.030)
Urobilinogen, Ur: 0.2 mg/dL (ref 0.2–1.0)
pH, UA: 6.5 (ref 5.0–7.5)

## 2022-02-27 LAB — MICROSCOPIC EXAMINATION: Bacteria, UA: NONE SEEN

## 2022-02-27 NOTE — Progress Notes (Unsigned)
02/27/2022 4:28 PM   Stephanie Logan Feb 28, 1991 [id number]  CC: Chief Complaint  Patient presents with   Follow-up    ER follow-up   HPI: Stephanie Logan is a 31 y.o. female with PMH nephrolithiasis who presents today for ED follow-up.   She was seen in the emergency department 6 days ago with reports of severe left flank pain.  She underwent CT stone study at that time which revealed bilateral punctate nephrolithiasis, nonobstructing, without hydronephrosis or perinephric or periureteral stranding.  Her UA at that time was notable for trace leukocytes only.  PMH: Past Medical History:  Diagnosis Date   Amenorrhea    Anxiety    Kidney stone    PCOS (polycystic ovarian syndrome)    Post partum depression     Surgical History: Past Surgical History:  Procedure Laterality Date   ABDOMINAL SURGERY  10/24/2021   vessel ablation   BREAST EXCISIONAL BIOPSY Right 2018   fibroadenoma   BREAST EXCISIONAL BIOPSY Left 2010   fibroadenoma   BREAST FIBROADENOMA SURGERY  2008,2010   EXTRACORPOREAL SHOCK WAVE LITHOTRIPSY Right 05/16/2020   Procedure: EXTRACORPOREAL SHOCK WAVE LITHOTRIPSY (ESWL);  Surgeon: Abbie Sons, MD;  Location: [facility];  Service: Urology;  Laterality: Right;    Home Medications:  Allergies as of 02/27/2022       Reactions   Mucinex Dm [dm-guaifenesin Er] Hives, Swelling   Ciprofloxacin Rash        Medication List        Accurate as of February 27, 2022  4:28 PM. If you have any questions, ask your nurse or doctor.          STOP taking these medications    medroxyPROGESTERone 10 MG tablet Commonly known as: Provera Stopped by: Debroah Loop, PA-C       TAKE these medications    ALPRAZolam 0.5 MG tablet Commonly known as: Xanax Take 1 tablet (0.5 mg total) by mouth 2 (two) times daily as needed for anxiety.   busPIRone 5 MG tablet Commonly known as: BUSPAR Take 1 tablet (5 mg total) by mouth 2 (two) times daily.    meloxicam 7.5 MG tablet Commonly known as: MOBIC TAKE 1 TABLET(7.5 MG) BY MOUTH DAILY   methocarbamol 750 MG tablet Commonly known as: Robaxin-750 Take 1 tablet (750 mg total) by mouth every 8 (eight) hours as needed for muscle spasms.   naproxen 250 MG tablet Commonly known as: NAPROSYN Take 1 tablet (250 mg total) by mouth 2 (two) times daily with a meal for 10 days.   polyethylene glycol 17 g packet Commonly known as: MIRALAX / GLYCOLAX Take by mouth daily.   sertraline 100 MG tablet Commonly known as: Zoloft Take 1.5 tablets (150 mg total) by mouth daily.        Allergies:  Allergies  Allergen Reactions   Mucinex Dm [Dm-Guaifenesin Er] Hives and Swelling   Ciprofloxacin Rash    Family History: Family History  Problem Relation Age of Onset   Cancer Mother        cervical, precancerous   Cancer Father        Colon, Precancerous   Seizures Sister    Lung cancer Sister    Heart Problems Sister    Hashimoto's thyroiditis Brother    Learning disabilities Son    Cancer Maternal Grandmother        brain   Healthy Maternal Grandfather    Healthy Paternal Grandmother    Aneurysm Paternal Grandfather  Brain   Breast cancer Neg Hx     Social History:   reports that she has never smoked. She has never been exposed to tobacco smoke. She has never used smokeless tobacco. She reports current alcohol use. She reports that she does not use drugs.  Physical Exam: BP 95/66   Pulse 71   Ht '5\' 3"'$  (1.6 m)   Wt 145 lb (65.8 kg)   LMP 02/07/2022 (Approximate)   BMI 25.69 kg/m   Constitutional:  Alert and oriented, no acute distress, nontoxic appearing HEENT: [place], AT Cardiovascular: No clubbing, cyanosis, or edema Respiratory: Normal respiratory effort, no increased work of breathing Skin: No rashes, bruises or suspicious lesions Neurologic: Grossly intact, no focal deficits, moving all 4 extremities Psychiatric: Normal mood and affect  Laboratory  Data: Results for orders placed or performed in visit on 02/27/22  Microscopic Examination   Urine  Result Value Ref Range   WBC, UA 6-10 (A) 0 - 5 /hpf   RBC 0-2 0 - 2 /hpf   Epithelial Cells (non renal) 0-10 0 - 10 /hpf   Bacteria, UA None seen None seen/Few  Urinalysis, Complete  Result Value Ref Range   Specific Gravity, UA 1.010 1.005 - 1.030   pH, UA 6.5 5.0 - 7.5   Color, UA Yellow Yellow   Appearance Ur Clear Clear   Leukocytes,UA 1+ (A) Negative   Protein,UA Negative Negative/Trace   Glucose, UA Negative Negative   Ketones, UA Negative Negative   RBC, UA Negative Negative   Bilirubin, UA Negative Negative   Urobilinogen, Ur 0.2 0.2 - 1.0 mg/dL   Nitrite, UA Negative Negative   Microscopic Examination See below:    Assessment & Plan:   1. Flank pain with history of urolithiasis We discussed that there was no evidence of acute stone episode on CT, though we will repeat urine testing today.  We discussed that her differential includes a recently passed stone versus intermittent stone obstruction (though I was frank with her that I think this is unlikely given her stone location) versus musculoskeletal pain.  I will reach out to her via MyChart when her urinalysis results are available.  We discussed returning to clinic with recurrent symptoms. - Urinalysis, Complete - CULTURE, URINE COMPREHENSIVE  Return if symptoms worsen or fail to improve.  Debroah Loop, PA-C  [facility] [address] [place],  [address] [phone]

## 2022-03-04 LAB — CULTURE, URINE COMPREHENSIVE

## 2022-03-13 ENCOUNTER — Ambulatory Visit: Admitting: Nurse Practitioner

## 2022-04-23 ENCOUNTER — Other Ambulatory Visit: Payer: Self-pay | Admitting: Nurse Practitioner

## 2022-04-23 NOTE — Telephone Encounter (Signed)
Requested Prescriptions  Pending Prescriptions Disp Refills  . meloxicam (MOBIC) 7.5 MG tablet [Pharmacy Med Name: MELOXICAM 7.5MG TABLETS] 90 tablet 0    Sig: TAKE 1 TABLET(7.5 MG) BY MOUTH DAILY     Analgesics:  COX2 Inhibitors Failed - 04/23/2022  3:45 AM      Failed - Manual Review: Labs are only required if the patient has taken medication for more than 8 weeks.      Passed - HGB in normal range and within 360 days    Hemoglobin  Date Value Ref Range Status  02/21/2022 12.5 12.0 - 15.0 g/dL Final  07/24/2021 13.3 11.1 - 15.9 g/dL Final         Passed - Cr in normal range and within 360 days    Creatinine  Date Value Ref Range Status  04/16/2021 81.9 20.0 - 300.0 mg/dL Final   Creatinine, Ser  Date Value Ref Range Status  02/21/2022 0.64 0.44 - 1.00 mg/dL Final         Passed - HCT in normal range and within 360 days    HCT  Date Value Ref Range Status  02/21/2022 37.7 36.0 - 46.0 % Final   Hematocrit  Date Value Ref Range Status  07/24/2021 40.6 34.0 - 46.6 % Final         Passed - AST in normal range and within 360 days    AST  Date Value Ref Range Status  02/21/2022 24 15 - 41 U/L Final         Passed - ALT in normal range and within 360 days    ALT  Date Value Ref Range Status  02/21/2022 18 0 - 44 U/L Final         Passed - eGFR is 30 or above and within 360 days    GFR calc Af Amer  Date Value Ref Range Status  03/16/2020 >60 >60 mL/min Final   GFR, Estimated  Date Value Ref Range Status  02/21/2022 >60 >60 mL/min Final    Comment:    (NOTE) Calculated using the CKD-EPI Creatinine Equation (2021)    eGFR  Date Value Ref Range Status  07/24/2021 97 >59 mL/min/1.73 Final         Passed - Patient is not pregnant      Passed - Valid encounter within last 12 months    Recent Outpatient Visits          2 months ago Severe episode of recurrent major depressive disorder, without psychotic features ([place])   [place], Jolene  T, NP   5 months ago Severe episode of recurrent major depressive disorder, without psychotic features ([place])   [place], Jolene T, NP   9 months ago RLQ abdominal pain   [place], [place] T, NP   9 months ago RLQ abdominal pain   [facility] V[place], Avanti, MD   10 months ago Severe episode of recurrent major depressive disorder, without psychotic features ([place])   [place], Barbaraann Faster, NP

## 2022-07-06 ENCOUNTER — Ambulatory Visit: Admitting: Physician Assistant

## 2022-07-06 ENCOUNTER — Encounter: Payer: Self-pay | Admitting: Certified Nurse Midwife

## 2022-07-07 ENCOUNTER — Encounter: Payer: Self-pay | Admitting: Nurse Practitioner

## 2022-07-07 ENCOUNTER — Ambulatory Visit: Admitting: Nurse Practitioner

## 2022-07-07 ENCOUNTER — Other Ambulatory Visit: Payer: Self-pay | Admitting: Nurse Practitioner

## 2022-07-07 VITALS — BP 88/56 | HR 73 | Temp 98.1°F | Ht 63.0 in | Wt 148.3 lb

## 2022-07-07 DIAGNOSIS — K219 Gastro-esophageal reflux disease without esophagitis: Secondary | ICD-10-CM

## 2022-07-07 DIAGNOSIS — Z87898 Personal history of other specified conditions: Secondary | ICD-10-CM

## 2022-07-07 MED ORDER — OMEPRAZOLE 20 MG PO CPDR: 20.0000 mg | DELAYED_RELEASE_CAPSULE | Freq: Every day | ORAL | 0 refills | Status: DC

## 2022-07-07 NOTE — Assessment & Plan Note (Signed)
Repeat imaging ordered, due 07/08/22.

## 2022-07-07 NOTE — Progress Notes (Signed)
BP (!) 88/56   Pulse 73   Temp 98.1 F (36.7 C) (Oral)   Ht '5\' 3"'$  (1.6 m)   Wt 148 lb 4.8 oz (67.3 kg)   SpO2 97%   BMI 26.27 kg/m    Subjective:    Patient ID: Stephanie Logan, female    DOB: [birth year], 31 y.o.   MRN: [id number]  HPI: Stephanie Logan is a 32 y.o. female  Chief Complaint  Patient presents with   Gastroesophageal Reflux    Patient says she is having recurrent heartburn. Patient says her symptoms started on Sunday night. Patient says she has tried over the [employer], Copy.    GERD Started around May 2023.  Has tried OTC Pepcid, TUMS, and Omeprazole (is on day 2 of this).  Does notice the burning has changed and improved with PPI on board. GERD control status:  improving Satisfied with current treatment? yes Heartburn frequency: every two weeks or so Medication side effects: no  Medication compliance: stable Previous GERD medications: Pepcid, TUMS, Prilosec Antacid use frequency:  no longer using Duration: as above Nature: burning Location: epigastric Heartburn duration: all day Alleviatiating factors:  Omeprazole Aggravating factors: various foods Dysphagia: no Odynophagia:  no Hematemesis: no Blood in stool: no EGD: no   Relevant past medical, surgical, family and social history reviewed and updated as indicated. Interim medical history since our last visit reviewed. Allergies and medications reviewed and updated.  Review of Systems  Constitutional:  Negative for activity change, appetite change, diaphoresis, fatigue and fever.  Respiratory:  Negative for cough, chest tightness and shortness of breath.   Cardiovascular:  Negative for chest pain, palpitations and leg swelling.  Gastrointestinal:  Positive for abdominal pain (epigastric) and nausea. Negative for abdominal distention, blood in stool, constipation, diarrhea and vomiting.  Neurological: Negative.   Psychiatric/Behavioral: Negative.      Per HPI unless  specifically indicated above     Objective:    BP (!) 88/56   Pulse 73   Temp 98.1 F (36.7 C) (Oral)   Ht '5\' 3"'$  (1.6 m)   Wt 148 lb 4.8 oz (67.3 kg)   SpO2 97%   BMI 26.27 kg/m   Wt Readings from Last 3 Encounters:  07/07/22 148 lb 4.8 oz (67.3 kg)  02/27/22 145 lb (65.8 kg)  02/21/22 145 lb (65.8 kg)    Physical Exam Vitals and nursing note reviewed.  Constitutional:      General: She is awake. She is not in acute distress.    Appearance: She is well-developed and well-groomed. She is not ill-appearing or toxic-appearing.  HENT:     Head: Normocephalic.     Right Ear: Hearing normal.     Left Ear: Hearing normal.  Eyes:     General: Lids are normal.        Right eye: No discharge.        Left eye: No discharge.     Conjunctiva/sclera: Conjunctivae normal.     Pupils: Pupils are equal, round, and reactive to light.  Neck:     Thyroid: No thyromegaly.     Vascular: No carotid bruit.  Cardiovascular:     Rate and Rhythm: Normal rate and regular rhythm.     Heart sounds: Normal heart sounds. No murmur heard.    No gallop.  Pulmonary:     Effort: Pulmonary effort is normal. No accessory muscle usage or respiratory distress.     Breath sounds: Normal breath sounds.  Abdominal:     General: Bowel sounds are normal. There is no distension.     Palpations: Abdomen is soft. There is no hepatomegaly.     Tenderness: There is abdominal tenderness in the epigastric area.  Musculoskeletal:     Cervical back: Normal range of motion and neck supple.     Right lower leg: No edema.     Left lower leg: No edema.  Lymphadenopathy:     Cervical: No cervical adenopathy.  Skin:    General: Skin is warm and dry.  Neurological:     Mental Status: She is alert and oriented to person, place, and time.     Motor: Motor function is intact.     Coordination: Coordination is intact.     Gait: Gait is intact.     Deep Tendon Reflexes: Reflexes are normal and symmetric.     Reflex  Scores:      Brachioradialis reflexes are 2+ on the right side and 2+ on the left side.      Patellar reflexes are 2+ on the right side and 2+ on the left side. Psychiatric:        Attention and Perception: Attention normal.        Mood and Affect: Mood normal.        Speech: Speech normal.        Behavior: Behavior normal. Behavior is cooperative.        Thought Content: Thought content normal.     Results for orders placed or performed in visit on 02/27/22  CULTURE, URINE COMPREHENSIVE   Specimen: Urine   UR  Result Value Ref Range   Urine Culture, Comprehensive Final report    Organism ID, Bacteria Comment    Organism ID, Bacteria Not applicable   Microscopic Examination   Urine  Result Value Ref Range   WBC, UA 6-10 (A) 0 - 5 /hpf   RBC, Urine 0-2 0 - 2 /hpf   Epithelial Cells (non renal) 0-10 0 - 10 /hpf   Bacteria, UA None seen None seen/Few  Urinalysis, Complete  Result Value Ref Range   Specific Gravity, UA 1.010 1.005 - 1.030   pH, UA 6.5 5.0 - 7.5   Color, UA Yellow Yellow   Appearance Ur Clear Clear   Leukocytes,UA 1+ (A) Negative   Protein,UA Negative Negative/Trace   Glucose, UA Negative Negative   Ketones, UA Negative Negative   RBC, UA Negative Negative   Bilirubin, UA Negative Negative   Urobilinogen, Ur 0.2 0.2 - 1.0 mg/dL   Nitrite, UA Negative Negative   Microscopic Examination See below:       Assessment & Plan:   Problem List Items Addressed This Visit       Digestive   Gastroesophageal reflux disease without esophagitis - Primary    Ongoing since May, currently is finding benefit from Prilosec.  Will continue this for short term.  Script sent 20 MG daily.  Recommend she start food journal and document trigger foods, avoid any foods that make heart burn worse.  Return in 4 weeks.      Relevant Medications   omeprazole (PRILOSEC) 20 MG capsule     Other   History of abnormal mammogram    Repeat imaging ordered, due 07/08/22.       Relevant Orders   US BREAST LTD UNI RIGHT INC AXILLA     Follow up plan: Return in about 4 weeks (around 08/04/2022) for Annual physical.

## 2022-07-07 NOTE — Assessment & Plan Note (Signed)
Ongoing since May, currently is finding benefit from Prilosec.  Will continue this for short term.  Script sent 20 MG daily.  Recommend she start food journal and document trigger foods, avoid any foods that make heart burn worse.  Return in 4 weeks.

## 2022-07-07 NOTE — Patient Instructions (Signed)

## 2022-07-07 NOTE — Telephone Encounter (Signed)
Provider wants 30 days and keep food diary and return. Requested Prescriptions  Pending Prescriptions Disp Refills  . omeprazole (PRILOSEC) 20 MG capsule [Pharmacy Med Name: OMEPRAZOLE '20MG'$  CAPSULES] 90 capsule     Sig: TAKE 1 CAPSULE(20 MG) BY MOUTH DAILY     Gastroenterology: Proton Pump Inhibitors Passed - 07/07/2022  5:02 PM      Passed - Valid encounter within last 12 months    Recent Outpatient Visits          Today Gastroesophageal reflux disease without esophagitis   [place], Jolene T, NP   5 months ago Severe episode of recurrent major depressive disorder, without psychotic features ([place])   [place], Jolene T, NP   8 months ago Severe episode of recurrent major depressive disorder, without psychotic features ([place])   [place], Jolene T, NP   11 months ago RLQ abdominal pain   [place], [place] T, NP   11 months ago RLQ abdominal pain   [place], MD      Future Appointments            In 4 weeks Cannady, Barbaraann Faster, NP [facility]

## 2022-07-08 ENCOUNTER — Encounter: Payer: Self-pay | Admitting: Certified Nurse Midwife

## 2022-07-08 ENCOUNTER — Encounter: Payer: Self-pay | Admitting: Obstetrics and Gynecology

## 2022-07-17 ENCOUNTER — Telehealth: Admitting: Physician Assistant

## 2022-07-17 DIAGNOSIS — H109 Unspecified conjunctivitis: Secondary | ICD-10-CM | POA: Diagnosis not present

## 2022-07-17 MED ORDER — POLYMYXIN B-TRIMETHOPRIM 10000-0.1 UNIT/ML-% OP SOLN: 1.0000 [drp] | OPHTHALMIC | 0 refills | Status: DC

## 2022-07-17 NOTE — Progress Notes (Signed)

## 2022-07-20 ENCOUNTER — Other Ambulatory Visit: Payer: Self-pay | Admitting: Nurse Practitioner

## 2022-07-20 ENCOUNTER — Ambulatory Visit
Admission: RE | Admit: 2022-07-20 | Discharge: 2022-07-20 | Disposition: A | Discharge status: Home / Self Care | Source: Ambulatory Visit | Attending: Nurse Practitioner | Admitting: Nurse Practitioner

## 2022-07-20 DIAGNOSIS — Z87898 Personal history of other specified conditions: Secondary | ICD-10-CM | POA: Diagnosis not present

## 2022-07-20 NOTE — Progress Notes (Signed)
Contacted via MyChart   Good news that are to right breast remains stable -- they recommend starting mammograms at 38 and no further imaging until then unless acute changes.  :)

## 2022-07-21 NOTE — Telephone Encounter (Signed)
Requested Prescriptions  Pending Prescriptions Disp Refills  . meloxicam (MOBIC) 7.5 MG tablet [Pharmacy Med Name: MELOXICAM 7.5MG TABLETS] 90 tablet 0    Sig: TAKE 1 TABLET(7.5 MG) BY MOUTH DAILY     Analgesics:  COX2 Inhibitors Failed - 07/20/2022  3:41 AM      Failed - Manual Review: Labs are only required if the patient has taken medication for more than 8 weeks.      Passed - HGB in normal range and within 360 days    Hemoglobin  Date Value Ref Range Status  02/21/2022 12.5 12.0 - 15.0 g/dL Final  07/24/2021 13.3 11.1 - 15.9 g/dL Final         Passed - Cr in normal range and within 360 days    Creatinine  Date Value Ref Range Status  04/16/2021 81.9 20.0 - 300.0 mg/dL Final   Creatinine, Ser  Date Value Ref Range Status  02/21/2022 0.64 0.44 - 1.00 mg/dL Final         Passed - HCT in normal range and within 360 days    HCT  Date Value Ref Range Status  02/21/2022 37.7 36.0 - 46.0 % Final   Hematocrit  Date Value Ref Range Status  07/24/2021 40.6 34.0 - 46.6 % Final         Passed - AST in normal range and within 360 days    AST  Date Value Ref Range Status  02/21/2022 24 15 - 41 U/L Final         Passed - ALT in normal range and within 360 days    ALT  Date Value Ref Range Status  02/21/2022 18 0 - 44 U/L Final         Passed - eGFR is 30 or above and within 360 days    GFR calc Af Amer  Date Value Ref Range Status  03/16/2020 >60 >60 mL/min Final   GFR, Estimated  Date Value Ref Range Status  02/21/2022 >60 >60 mL/min Final    Comment:    (NOTE) Calculated using the CKD-EPI Creatinine Equation (2021)    eGFR  Date Value Ref Range Status  07/24/2021 97 >59 mL/min/1.73 Final         Passed - Patient is not pregnant      Passed - Valid encounter within last 12 months    Recent Outpatient Visits          2 weeks ago Gastroesophageal reflux disease without esophagitis   [place], Jolene T, NP   5 months ago Severe  episode of recurrent major depressive disorder, without psychotic features ([place])   [place], Jolene T, NP   8 months ago Severe episode of recurrent major depressive disorder, without psychotic features ([place])   [place], Jolene T, NP   12 months ago RLQ abdominal pain   [place], [place] T, NP   1 year ago RLQ abdominal pain   [facility] [place], Avanti, MD      Future Appointments            In 2 weeks Cannady, Barbaraann Faster, NP [facility]

## 2022-07-22 ENCOUNTER — Telehealth: Admitting: Emergency Medicine

## 2022-07-22 DIAGNOSIS — B9689 Other specified bacterial agents as the cause of diseases classified elsewhere: Secondary | ICD-10-CM | POA: Diagnosis not present

## 2022-07-22 DIAGNOSIS — J019 Acute sinusitis, unspecified: Secondary | ICD-10-CM

## 2022-07-22 MED ORDER — AMOXICILLIN-POT CLAVULANATE 875-125 MG PO TABS: 1.0000 | ORAL_TABLET | Freq: Two times a day (BID) | ORAL | 0 refills | Status: DC

## 2022-07-22 NOTE — Progress Notes (Signed)
E-Visit for Sinus Problems  We are sorry that you are not feeling well.  Here is how we plan to help!  Based on what you have shared with me it looks like you have sinusitis.  Sinusitis is inflammation and infection in the sinus cavities of the head.  Based on your presentation I believe you most likely have Acute Bacterial Sinusitis.  This is an infection caused by bacteria and is treated with antibiotics. I have prescribed Augmentin '875mg'$ /'125mg'$  one tablet twice daily with food, for 7 days.   You may use an oral decongestant such as Mucinex D or if you have glaucoma or high blood pressure use plain Mucinex.   Saline nasal spray help and can safely be used as often as needed for congestion.  Try using saline irrigation, such as with a neti pot, several times a day while you are sick. Many neti pots come with salt packets premeasured to use to make saline. If you use your own salt, make sure it is kosher salt or sea salt (don't use table salt as it has iodine in it and you don't need that in your nose). Use distilled water to make saline. If you mix your own saline using your own salt, the recipe is 1/4 teaspoon salt in 1 cup warm water. Using saline irrigation can help prevent and treat sinus infections.   If you develop worsening sinus pain, fever or notice severe headache and vision changes, or if symptoms are not better after completion of antibiotic, please schedule an appointment with a health care provider.    Sinus infections are not as easily transmitted as other respiratory infection, however we still recommend that you avoid close contact with loved ones, especially the very young and elderly.  Remember to wash your hands thoroughly throughout the day as this is the number one way to prevent the spread of infection!  Home Care: Only take medications as instructed by your medical team. Complete the entire course of an antibiotic. Do not take these medications with alcohol. A steam or  ultrasonic humidifier can help congestion.  You can place a towel over your head and breathe in the steam from hot water coming from a faucet. Avoid close contacts especially the very young and the elderly. Cover your mouth when you cough or sneeze. Always remember to wash your hands.  Get Help Right Away If: You develop worsening fever or sinus pain. You develop a severe head ache or visual changes. Your symptoms persist after you have completed your treatment plan.  Make sure you Understand these instructions. Will watch your condition. Will get help right away if you are not doing well or get worse.  Thank you for choosing an e-visit.  Your e-visit answers were reviewed by a board certified advanced clinical practitioner to complete your personal care plan. Depending upon the condition, your plan could have included both over the counter or prescription medications.  Please review your pharmacy choice. Make sure the pharmacy is open so you can pick up prescription now. If there is a problem, you may contact your provider through [employer] and have the prescription routed to another pharmacy.  Your safety is important to [place]. If you have drug allergies check your prescription carefully.   For the next 24 hours you can use MyChart to ask questions about today's visit, request a non-urgent call back, or ask for a work or school excuse. You will get an email in the next two days asking  about your experience. I hope that your e-visit has been valuable and will speed your recovery.  I have spent 5 minutes in review of e-visit questionnaire, review and updating patient chart, medical decision making and response to patient.   Willeen Cass, PhD, FNP-BC

## 2022-08-04 ENCOUNTER — Encounter: Payer: Self-pay | Admitting: Nurse Practitioner

## 2022-08-04 ENCOUNTER — Ambulatory Visit (INDEPENDENT_AMBULATORY_CARE_PROVIDER_SITE_OTHER): Admitting: Nurse Practitioner

## 2022-08-04 VITALS — BP 101/61 | HR 72 | Temp 98.3°F | Ht 63.5 in | Wt 148.6 lb

## 2022-08-04 DIAGNOSIS — F411 Generalized anxiety disorder: Secondary | ICD-10-CM

## 2022-08-04 DIAGNOSIS — E282 Polycystic ovarian syndrome: Secondary | ICD-10-CM

## 2022-08-04 DIAGNOSIS — E7800 Pure hypercholesterolemia, unspecified: Secondary | ICD-10-CM | POA: Diagnosis not present

## 2022-08-04 DIAGNOSIS — G43109 Migraine with aura, not intractable, without status migrainosus: Secondary | ICD-10-CM | POA: Diagnosis not present

## 2022-08-04 DIAGNOSIS — Z79899 Other long term (current) drug therapy: Secondary | ICD-10-CM

## 2022-08-04 DIAGNOSIS — K219 Gastro-esophageal reflux disease without esophagitis: Secondary | ICD-10-CM

## 2022-08-04 DIAGNOSIS — Z0000 Encounter for general adult medical examination without abnormal findings: Secondary | ICD-10-CM

## 2022-08-04 DIAGNOSIS — F332 Major depressive disorder, recurrent severe without psychotic features: Secondary | ICD-10-CM

## 2022-08-04 DIAGNOSIS — Z1159 Encounter for screening for other viral diseases: Secondary | ICD-10-CM | POA: Diagnosis not present

## 2022-08-04 DIAGNOSIS — Z23 Encounter for immunization: Secondary | ICD-10-CM

## 2022-08-04 DIAGNOSIS — E538 Deficiency of other specified B group vitamins: Secondary | ICD-10-CM

## 2022-08-04 MED ORDER — SERTRALINE HCL 100 MG PO TABS: 200.0000 mg | ORAL_TABLET | Freq: Every day | ORAL | 4 refills | Status: DC

## 2022-08-04 MED ORDER — ALPRAZOLAM 0.5 MG PO TABS: 0.5000 mg | ORAL_TABLET | Freq: Two times a day (BID) | ORAL | 2 refills | Status: DC | PRN

## 2022-08-04 MED ORDER — OMEPRAZOLE 20 MG PO CPDR: 20.0000 mg | DELAYED_RELEASE_CAPSULE | Freq: Two times a day (BID) | ORAL | 4 refills | Status: DC

## 2022-08-04 NOTE — Assessment & Plan Note (Signed)
Chronic, ongoing.  Denies SI/HI.  At this time will continue Zoloft 200 MG daily and maintain Xanax as ordered, has been on for 6 years and does not want to change.  Recommend return to therapy, even if virtual (ie. [facility]).  UDS today and controlled substance agreement up to date.  Return in 3 months.

## 2022-08-04 NOTE — Assessment & Plan Note (Addendum)
Chronic, ongoing with continued daily reflux.  Increase Prilosec to BID dosing. Recommend she start food journal and document trigger foods, avoid any foods that make heart burn worse.  Referral to GI for further recommendations due to poor control.  Labs today.  Consider RUQ ultrasound due to intermittent discomfort there, although recent CT showed normal gall bladder.

## 2022-08-04 NOTE — Assessment & Plan Note (Signed)
Chronic, stable with rescue medication only  At this time continue current OTC medications at home and adjust as needed.  Will restart preventative in future if needed. Labs today.

## 2022-08-04 NOTE — Progress Notes (Signed)
BP 101/61   Pulse 72   Temp 98.3 F (36.8 C) (Oral)   Ht 5' 3.5" (1.613 m)   Wt 148 lb 9.6 oz (67.4 kg)   LMP 07/17/2022 (Approximate)   SpO2 99%   BMI 25.91 kg/m    Subjective:    Patient ID: Stephanie Logan, female    DOB: Jul 29, 1991, 31 y.o.   MRN: [id number]  HPI: Stephanie Logan is a 31 y.o. female presenting on 08/04/2022 for comprehensive medical examination. Current medical complaints include:none  She currently lives with: husband Menopausal Symptoms: no  MIGRAINES Very rarely has migraines -- Excedrin helps with this.  Maxalt offers benefit. Duration: years Onset: gradual Severity: 6/10 Quality: dull, aching, and throbbing Frequency: intermittent Location: to left side behind eye Headache duration: 24 to 48 hours Radiation: no Time of day headache occurs: varies Alleviating factors: ice and heat on neck Aggravating factors: lack of sleep Headache status at time of visit: asymptomatic Treatments attempted: Treatments attempted: ice, heat, APAP, and ibuprofen   Aura: yes Nausea:  yes Vomiting: no Photophobia:  yes Phonophobia:  yes Effect on social functioning:  yes Numbers of missed days of school/work each month: none Confusion:  no Gait disturbance/ataxia:  no Behavioral changes:  no Fevers:  no   GERD Currently taking Omeprazole 20 MG. Not noticing much difference. GERD control status: uncontrolled Satisfied with current treatment? yes Heartburn frequency: every day Medication side effects: no  Medication compliance: stable Dysphagia: no Odynophagia:  no Hematemesis: no Blood in stool: no EGD: no    ANXIETY/STRESS Currently taking Zoloft 200 MG daily and Xanax as needed 0.5 MG PRN. Xanax she uses about 1-3 times a week.  Has been on this regimen for 6 + years started by OB/GYN.  Has struggled with anxiety/depression her whole life, this worsened after 2nd child was born.  Her husband is double amputee, lost legs in [place].  Has  history of stillborn child. She reports overall feeling well, some increased stress with work being back in and state of the world.   Pt is aware of risks of benzo medication use to include increased sedation, respiratory suppression, falls, dependence and cardiovascular events.  Pt would like to continue treatment as benefit determined to outweigh risk.      Has attended therapy in past, did find helpful. Duration:stable Anxious mood: baseline Excessive worrying: baseline Irritability: at baseline Sweating: no Nausea:  with anxiety Palpitations: occasional with anxiety Hyperventilation: occasional with anxiety Panic attacks: not since October Agoraphobia: no  Obscessions/compulsions: yes  Depressed mood: occasional,  improved with 200 MG Sertraline    08/04/2022    4:11 PM 07/07/2022    4:34 PM 01/27/2022    8:54 AM 11/06/2021   11:14 AM 07/21/2021    8:45 AM  Depression screen PHQ 2/9  Decreased Interest '1 1 1 1 '$ 0  Down, Depressed, Hopeless 1 0 2 2 0  PHQ - 2 Score '2 1 3 3 '$ 0  Altered sleeping '2 2 2 3 3  '$ Tired, decreased energy '2 1 3 3 3  '$ Change in appetite 0 '2 2 1 '$ 0  Feeling bad or failure about yourself  2 0 '3 1 1  '$ Trouble concentrating 3 0 '2 3 3  '$ Moving slowly or fidgety/restless 0 0 0 0 0  Suicidal thoughts 0 0 0 0 0  PHQ-9 Score '11 6 15 14 10  '$ Difficult doing work/chores Somewhat difficult Somewhat difficult   Very difficult  Anhedonia: no Weight changes: no  Insomnia: yes hard to stay asleep Hypersomnia: no Fatigue/loss of energy: occasional Feelings of worthlessness: no Feelings of guilt: no Impaired concentration/indecisiveness: yes Suicidal ideations: no  Crying spells: yes Recent Stressors/Life Changes: yes   Relationship problems: no   Family stress: yes     Financial stress: no    Job stress: none   Recent death/loss: no     Aug 24, 2022    4:11 PM 07/07/2022    4:34 PM 01/27/2022    8:54 AM 11/06/2021   11:14 AM  GAD 7 : Generalized Anxiety Score   Nervous, Anxious, on Edge '2 2 3 3  '$ Control/stop worrying '2 2 3 3  '$ Worry too much - different things '2 2 3 3  '$ Trouble relaxing '2 2 3 3  '$ Restless '1 2 3 2  '$ Easily annoyed or irritable '3 2 3 3  '$ Afraid - awful might happen '2 2 3 3  '$ Total GAD 7 Score '14 14 21 20  '$ Anxiety Difficulty Somewhat difficult Somewhat difficult Very difficult Very difficult       01/21/2022   12:49 PM 01/27/2022    8:54 AM 02/21/2022    2:58 PM 07/07/2022    4:33 PM 2022/08/24    4:29 PM  [place] in the past year?  0  0 0  Was there an injury with Fall?  0  0 0  Fall Risk Category Calculator  0  0 0  Fall Risk Category  Low  Low Low  Patient Fall Risk Level Low fall risk Low fall risk Low fall risk Low fall risk Low fall risk  Patient at Risk for Falls Due to  No Fall Risks  No Fall Risks No Fall Risks  Fall risk Follow up  Falls evaluation completed  Falls evaluation completed Falls prevention discussed    Functional Status Survey: Is the patient deaf or have difficulty hearing?: No Does the patient have difficulty seeing, even when wearing glasses/contacts?: No Does the patient have difficulty concentrating, remembering, or making decisions?: No Does the patient have difficulty walking or climbing stairs?: No Does the patient have difficulty dressing or bathing?: No Does the patient have difficulty doing errands alone such as visiting a doctor's office or shopping?: No   Past Medical History:  Past Medical History:  Diagnosis Date   Amenorrhea    Anxiety    Kidney stone    PCOS (polycystic ovarian syndrome)    Post partum depression     Surgical History:  Past Surgical History:  Procedure Laterality Date   ABDOMINAL SURGERY  10/24/2021   vessel ablation   BREAST EXCISIONAL BIOPSY Right 2018   fibroadenoma   BREAST EXCISIONAL BIOPSY Left 2010   fibroadenoma   BREAST FIBROADENOMA SURGERY  2008,2010   EXTRACORPOREAL SHOCK WAVE LITHOTRIPSY Right 05/16/2020   Procedure: EXTRACORPOREAL SHOCK  WAVE LITHOTRIPSY (ESWL);  Surgeon: Abbie Sons, MD;  Location: [facility] ORS;  Service: Urology;  Laterality: Right;    Medications:  Current Outpatient Medications on File Prior to Visit  Medication Sig   meloxicam (MOBIC) 7.5 MG tablet TAKE 1 TABLET(7.5 MG) BY MOUTH DAILY   rizatriptan (MAXALT-MLT) 5 MG disintegrating tablet Take 5 mg by mouth as needed for migraine. May repeat in 2 hours if needed   No current facility-administered medications on file prior to visit.    Allergies:  Allergies  Allergen Reactions   Mucinex Dm [Dm-Guaifenesin Er] Hives and Swelling   Ciprofloxacin Rash    Social History:  Social History  Socioeconomic History   Marital status: Married    Spouse name: Donna Christen   Number of children: 3   Years of education: Not on file   Highest education level: Not on file  Occupational History   Not on file  Tobacco Use   Smoking status: Never    Passive exposure: Never   Smokeless tobacco: Never  Vaping Use   Vaping Use: Never used  Substance and Sexual Activity   Alcohol use: Yes    Comment: occas   Drug use: No   Sexual activity: Yes    Birth control/protection: Condom    Comment: undecided  Other Topics Concern   Not on file  Social History Narrative   All boys at home -- age 97, 71, 2.  Is going back at [employer].   Social Determinants of Health   Financial Resource Strain: Low Risk  (04/16/2021)   Overall Financial Resource Strain (CARDIA)    Difficulty of Paying Living Expenses: Not hard at all  Food Insecurity: No Food Insecurity (04/16/2021)   Hunger Vital Sign    Worried About Running Out of Food in the Last Year: Never true    Ran Out of Food in the Last Year: Never true  Transportation Needs: No Transportation Needs (04/16/2021)   PRAPARE - Hydrologist (Medical): No    Lack of Transportation (Non-Medical): No  Physical Activity: Sufficiently Active (04/16/2021)   Exercise Vital Sign    Days of Exercise per  Week: 5 days    Minutes of Exercise per Session: 40 min  Stress: Stress Concern Present (04/16/2021)   [place]    Feeling of Stress : To some extent  Social Connections: Moderately Isolated (04/16/2021)   Social Connection and Isolation Panel [NHANES]    Frequency of Communication with Friends and Family: More than three times a week    Frequency of Social Gatherings with Friends and Family: More than three times a week    Attends Religious Services: Never    Marine scientist or Organizations: No    Attends Archivist Meetings: Never    Marital Status: Married  Human resources officer Violence: Not At Risk (04/16/2021)   Humiliation, Afraid, Rape, and Kick questionnaire    Fear of Current or Ex-Partner: No    Emotionally Abused: No    Physically Abused: No    Sexually Abused: No   Social History   Tobacco Use  Smoking Status Never   Passive exposure: Never  Smokeless Tobacco Never   Social History   Substance and Sexual Activity  Alcohol Use Yes   Comment: occas    Family History:  Family History  Problem Relation Age of Onset   Cancer Mother        cervical, precancerous   Cancer Father        Colon, Precancerous   Seizures Sister    Lung cancer Sister    Heart Problems Sister    Hashimoto's thyroiditis Brother    Learning disabilities Son    Cancer Maternal Grandmother        brain   Healthy Maternal Grandfather    Healthy Paternal Grandmother    Aneurysm Paternal Grandfather        Brain   Breast cancer Neg Hx     Past medical history, surgical history, medications, allergies, family history and social history reviewed with patient today and changes made to appropriate areas of the  chart.   ROS All other ROS negative except what is listed above and in the HPI.      Objective:    BP 101/61   Pulse 72   Temp 98.3 F (36.8 C) (Oral)   Ht 5' 3.5" (1.613 m)   Wt 148 lb 9.6 oz  (67.4 kg)   LMP 07/17/2022 (Approximate)   SpO2 99%   BMI 25.91 kg/m   Wt Readings from Last 3 Encounters:  08/04/22 148 lb 9.6 oz (67.4 kg)  07/07/22 148 lb 4.8 oz (67.3 kg)  02/27/22 145 lb (65.8 kg)    Physical Exam Vitals and nursing note reviewed. Exam conducted with a chaperone present.  Constitutional:      General: She is awake. She is not in acute distress.    Appearance: She is well-developed and well-groomed. She is not ill-appearing or toxic-appearing.  HENT:     Head: Normocephalic and atraumatic.     Right Ear: Hearing, tympanic membrane, ear canal and external ear normal. No drainage.     Left Ear: Hearing, tympanic membrane, ear canal and external ear normal. No drainage.     Nose: Nose normal.     Right Sinus: No maxillary sinus tenderness or frontal sinus tenderness.     Left Sinus: No maxillary sinus tenderness or frontal sinus tenderness.     Mouth/Throat:     Mouth: Mucous membranes are moist.     Pharynx: Oropharynx is clear. Uvula midline. No pharyngeal swelling, oropharyngeal exudate or posterior oropharyngeal erythema.  Eyes:     General: Lids are normal.        Right eye: No discharge.        Left eye: No discharge.     Extraocular Movements: Extraocular movements intact.     Conjunctiva/sclera: Conjunctivae normal.     Pupils: Pupils are equal, round, and reactive to light.     Visual Fields: Right eye visual fields normal and left eye visual fields normal.  Neck:     Thyroid: No thyromegaly.     Vascular: No carotid bruit.     Trachea: Trachea normal.  Cardiovascular:     Rate and Rhythm: Normal rate and regular rhythm.     Heart sounds: Normal heart sounds. No murmur heard.    No gallop.  Pulmonary:     Effort: Pulmonary effort is normal. No accessory muscle usage or respiratory distress.     Breath sounds: Normal breath sounds.  Chest:  Breasts:    Right: Normal.     Left: Normal.  Abdominal:     General: Bowel sounds are normal.      Palpations: Abdomen is soft. There is no hepatomegaly or splenomegaly.     Tenderness: There is no abdominal tenderness.  Musculoskeletal:        General: Normal range of motion.     Cervical back: Normal range of motion and neck supple.     Right lower leg: No edema.     Left lower leg: No edema.  Lymphadenopathy:     Head:     Right side of head: No submental, submandibular, tonsillar, preauricular or posterior auricular adenopathy.     Left side of head: No submental, submandibular, tonsillar, preauricular or posterior auricular adenopathy.     Cervical: No cervical adenopathy.     Upper Body:     Right upper body: No supraclavicular, axillary or pectoral adenopathy.     Left upper body: No supraclavicular, axillary or pectoral adenopathy.  Skin:  General: Skin is warm and dry.     Capillary Refill: Capillary refill takes less than 2 seconds.     Findings: No rash.  Neurological:     Mental Status: She is alert and oriented to person, place, and time.     Gait: Gait is intact.     Deep Tendon Reflexes: Reflexes are normal and symmetric.     Reflex Scores:      Brachioradialis reflexes are 2+ on the right side and 2+ on the left side.      Patellar reflexes are 2+ on the right side and 2+ on the left side. Psychiatric:        Attention and Perception: Attention normal.        Mood and Affect: Mood normal.        Speech: Speech normal.        Behavior: Behavior normal. Behavior is cooperative.        Thought Content: Thought content normal.        Judgment: Judgment normal.    Results for orders placed or performed in visit on 02/27/22  CULTURE, URINE COMPREHENSIVE   Specimen: Urine   UR  Result Value Ref Range   Urine Culture, Comprehensive Final report    Organism ID, Bacteria Comment    Organism ID, Bacteria Not applicable   Microscopic Examination   Urine  Result Value Ref Range   WBC, UA 6-10 (A) 0 - 5 /hpf   RBC, Urine 0-2 0 - 2 /hpf   Epithelial Cells (non  renal) 0-10 0 - 10 /hpf   Bacteria, UA None seen None seen/Few  Urinalysis, Complete  Result Value Ref Range   Specific Gravity, UA 1.010 1.005 - 1.030   pH, UA 6.5 5.0 - 7.5   Color, UA Yellow Yellow   Appearance Ur Clear Clear   Leukocytes,UA 1+ (A) Negative   Protein,UA Negative Negative/Trace   Glucose, UA Negative Negative   Ketones, UA Negative Negative   RBC, UA Negative Negative   Bilirubin, UA Negative Negative   Urobilinogen, Ur 0.2 0.2 - 1.0 mg/dL   Nitrite, UA Negative Negative   Microscopic Examination See below:       Assessment & Plan:   Problem List Items Addressed This Visit       Cardiovascular and Mediastinum   Migraine with aura    Chronic, stable with rescue medication only  At this time continue current OTC medications at home and adjust as needed.  Will restart preventative in future if needed. Labs today.      Relevant Medications   sertraline (ZOLOFT) 100 MG tablet   Other Relevant Orders   TSH     Digestive   Gastroesophageal reflux disease without esophagitis    Chronic, ongoing with continued daily reflux.  Increase Prilosec to BID dosing. Recommend she start food journal and document trigger foods, avoid any foods that make heart burn worse.  Referral to GI for further recommendations due to poor control.  Labs today.  Consider RUQ ultrasound due to intermittent discomfort there, although recent CT showed normal gall bladder.      Relevant Medications   omeprazole (PRILOSEC) 20 MG capsule   Other Relevant Orders   Gamma GT   Ambulatory referral to Gastroenterology     Endocrine   PCOS (polycystic ovarian syndrome)    Check A1c today.  Took Metformin in past, but caused GI upset.      Relevant Orders   HgB A1c  Other   Elevated low density lipoprotein (LDL) cholesterol level    Noted on past labs, recheck today and continue diet focus.      Relevant Orders   Comprehensive metabolic panel   Lipid Panel w/o Chol/HDL Ratio    Generalized anxiety disorder    Refer to depression plan of care for further.      Relevant Medications   sertraline (ZOLOFT) 100 MG tablet   ALPRAZolam (XANAX) 0.5 MG tablet   Other Relevant Orders   [id number] 11+Oxyco+Alc+Crt-Bund   Long term prescription benzodiazepine use    Educated at length on risks of long term use -- refer to anxiety plan.  UDS today and controlled subs contract up to date July 2022.      Relevant Orders   [id number] 11+Oxyco+Alc+Crt-Bund   Severe episode of recurrent major depressive disorder, without psychotic features ([place]) - Primary    Chronic, ongoing.  Denies SI/HI.  At this time will continue Zoloft 200 MG daily and maintain Xanax as ordered, has been on for 6 years and does not want to change.  Recommend return to therapy, even if virtual (ie. [facility]).  UDS today and controlled substance agreement up to date.  Return in 3 months.       Relevant Medications   sertraline (ZOLOFT) 100 MG tablet   ALPRAZolam (XANAX) 0.5 MG tablet   Other Relevant Orders   [id number] 11+Oxyco+Alc+Crt-Bund   TSH   Vitamin B 12 deficiency    Ongoing, continue supplement.  Recheck level today.      Relevant Orders   CBC with Differential/Platelet   Vitamin B12   Other Visit Diagnoses     Need for hepatitis C screening test       Hep C screen on labs today per guidelines for one time screening, discussed with patient.   Relevant Orders   Hepatitis C antibody   Encounter for annual physical exam       Annual physical today with labs and health maintenance reviewed, discussed with patient.        Follow up plan: Return in about 3 months (around 11/04/2022) for Anxiety.   LABORATORY TESTING:  - Pap smear: up to date  IMMUNIZATIONS:   - Tdap: Tetanus vaccination status reviewed: last tetanus booster within 10 years. - Influenza: Refused - Pneumovax: Not applicable - Prevnar: Not applicable - HPV: Not applicable - Zostavax vaccine: Not  applicable  SCREENING: -Mammogram: has been performed, does not need to repeat until age 31 - Colonoscopy: Not applicable  - Bone Density: Not applicable  -Hearing Test: Not applicable  -Spirometry: Not applicable   PATIENT COUNSELING:   Advised to take 1 mg of folate supplement per day if capable of pregnancy.   Sexuality: Discussed sexually transmitted diseases, partner selection, use of condoms, avoidance of unintended pregnancy  and contraceptive alternatives.   Advised to avoid cigarette smoking.  I discussed with the patient that most people either abstain from alcohol or drink within safe limits (<=14/week and <=4 drinks/occasion for males, <=7/weeks and <= 3 drinks/occasion for females) and that the risk for alcohol disorders and other health effects rises proportionally with the number of drinks per week and how often a drinker exceeds daily limits.  Discussed cessation/primary prevention of drug use and availability of treatment for abuse.   Diet: Encouraged to adjust caloric intake to maintain  or achieve ideal body weight, to reduce intake of dietary saturated fat and total fat, to limit sodium intake by avoiding high sodium  foods and not adding table salt, and to maintain adequate dietary potassium and calcium preferably from fresh fruits, vegetables, and low-fat dairy products.    Stressed the importance of regular exercise  Injury prevention: Discussed safety belts, safety helmets, smoke detector, smoking near bedding or upholstery.   Dental health: Discussed importance of regular tooth brushing, flossing, and dental visits.    NEXT PREVENTATIVE PHYSICAL DUE IN 1 YEAR. Return in about 3 months (around 11/04/2022) for Anxiety.

## 2022-08-04 NOTE — Assessment & Plan Note (Signed)
Educated at length on risks of long term use -- refer to anxiety plan.  UDS today and controlled subs contract up to date July 2022.

## 2022-08-04 NOTE — Assessment & Plan Note (Signed)
Noted on past labs, recheck today and continue diet focus.

## 2022-08-04 NOTE — Assessment & Plan Note (Signed)
Check A1c today.  Took Metformin in past, but caused GI upset.

## 2022-08-04 NOTE — Assessment & Plan Note (Signed)
Refer to depression plan of care for further. 

## 2022-08-04 NOTE — Assessment & Plan Note (Signed)
Ongoing, continue supplement.  Recheck level today.

## 2022-08-04 NOTE — Patient Instructions (Signed)

## 2022-08-05 LAB — CBC WITH DIFFERENTIAL/PLATELET
Basophils Absolute: 0.1 10*3/uL (ref 0.0–0.2)
Basos: 1 %
EOS (ABSOLUTE): 0.2 10*3/uL (ref 0.0–0.4)
Eos: 2 %
Hematocrit: 40.3 % (ref 34.0–46.6)
Hemoglobin: 14.1 g/dL (ref 11.1–15.9)
Immature Grans (Abs): 0 10*3/uL (ref 0.0–0.1)
Immature Granulocytes: 0 %
Lymphocytes Absolute: 2.2 10*3/uL (ref 0.7–3.1)
Lymphs: 26 %
MCH: 30.5 pg (ref 26.6–33.0)
MCHC: 35 g/dL (ref 31.5–35.7)
MCV: 87 fL (ref 79–97)
Monocytes Absolute: 0.6 10*3/uL (ref 0.1–0.9)
Monocytes: 7 %
Neutrophils Absolute: 5.7 10*3/uL (ref 1.4–7.0)
Neutrophils: 64 %
Platelets: 308 10*3/uL (ref 150–450)
RBC: 4.63 x10E6/uL (ref 3.77–5.28)
RDW: 12.5 % (ref 11.7–15.4)
WBC: 8.8 10*3/uL (ref 3.4–10.8)

## 2022-08-05 LAB — GAMMA GT: GGT: 14 IU/L (ref 0–60)

## 2022-08-05 LAB — COMPREHENSIVE METABOLIC PANEL
ALT: 16 IU/L (ref 0–32)
AST: 18 IU/L (ref 0–40)
Albumin/Globulin Ratio: 1.9 (ref 1.2–2.2)
Albumin: 5 g/dL — ABNORMAL HIGH (ref 3.9–4.9)
Alkaline Phosphatase: 61 IU/L (ref 44–121)
BUN/Creatinine Ratio: 22 (ref 9–23)
BUN: 16 mg/dL (ref 6–20)
Bilirubin Total: 0.3 mg/dL (ref 0.0–1.2)
CO2: 22 mmol/L (ref 20–29)
Calcium: 9.9 mg/dL (ref 8.7–10.2)
Chloride: 101 mmol/L (ref 96–106)
Creatinine, Ser: 0.74 mg/dL (ref 0.57–1.00)
Globulin, Total: 2.6 g/dL (ref 1.5–4.5)
Glucose: 82 mg/dL (ref 70–99)
Potassium: 4 mmol/L (ref 3.5–5.2)
Sodium: 137 mmol/L (ref 134–144)
Total Protein: 7.6 g/dL (ref 6.0–8.5)
eGFR: 109 mL/min/{1.73_m2} (ref >59)

## 2022-08-05 LAB — TSH: TSH: 1.73 u[IU]/mL (ref 0.450–4.500)

## 2022-08-05 LAB — LIPID PANEL W/O CHOL/HDL RATIO
Cholesterol, Total: 203 mg/dL — ABNORMAL HIGH (ref 100–199)
HDL: 57 mg/dL (ref >39)
LDL Chol Calc (NIH): 127 mg/dL — ABNORMAL HIGH (ref 0–99)
Triglycerides: 109 mg/dL (ref 0–149)
VLDL Cholesterol Cal: 19 mg/dL (ref 5–40)

## 2022-08-05 LAB — HEMOGLOBIN A1C
Est. average glucose Bld gHb Est-mCnc: 103 mg/dL
Hgb A1c MFr Bld: 5.2 % (ref 4.8–5.6)

## 2022-08-05 LAB — VITAMIN B12: Vitamin B-12: 455 pg/mL (ref 232–1245)

## 2022-08-05 LAB — HEPATITIS C ANTIBODY: Hep C Virus Ab: NONREACTIVE

## 2022-08-05 NOTE — Progress Notes (Signed)
Contacted via [place] afternoon Mar[place]asia, your labs have returned and are overall nice and stable with exception of cholesterol levels.  Your cholesterol is still high, but continued recommendations to make lifestyle changes. Your LDL is above normal. The LDL is the bad cholesterol. Over time and in combination with inflammation and other factors, this contributes to plaque which in turn may lead to stroke and/or heart attack down the road. Sometimes high LDL is primarily genetic, and people might be eating all the right foods but still have high numbers. Other times, there is room for improvement in one's diet and eating healthier can bring this number down and potentially reduce one's risk of heart attack and/or stroke.   To reduce your LDL, Remember - more fruits and vegetables, more fish, and limit red meat and dairy products. More soy, nuts, beans, barley, lentils, oats and plant sterol ester enriched margarine instead of butter. I also encourage eliminating sugar and processed food. Remember, shop on the outside of the grocery store and visit your [employer]. If you would like to talk with me about dietary changes for your cholesterol, please let me know. We should recheck your cholesterol in 12 months.  Any questions? Keep being amazing!!  Thank you for allowing me to participate in your care.  I appreciate you. Kindest regards, Laiba Fuerte

## 2022-08-06 LAB — DRUG SCREEN 764883 11+OXYCO+ALC+CRT-BUND
Amphetamines, Urine: NEGATIVE ng/mL
BENZODIAZ UR QL: NEGATIVE ng/mL
Barbiturate: NEGATIVE ng/mL
Cannabinoid Quant, Ur: NEGATIVE ng/mL
Cocaine (Metabolite): NEGATIVE ng/mL
Creatinine: 65.5 mg/dL (ref 20.0–300.0)
Ethanol: NEGATIVE %
Meperidine: NEGATIVE ng/mL
Methadone Screen, Urine: NEGATIVE ng/mL
OPIATE SCREEN URINE: NEGATIVE ng/mL
Oxycodone/Oxymorphone, Urine: NEGATIVE ng/mL
Phencyclidine: NEGATIVE ng/mL
Propoxyphene: NEGATIVE ng/mL
Tramadol: NEGATIVE ng/mL
pH, Urine: 6.5 (ref 4.5–8.9)

## 2022-08-10 ENCOUNTER — Other Ambulatory Visit: Payer: Self-pay | Admitting: Nurse Practitioner

## 2022-08-10 DIAGNOSIS — K219 Gastro-esophageal reflux disease without esophagitis: Secondary | ICD-10-CM

## 2022-08-10 NOTE — Telephone Encounter (Signed)
Requested Prescriptions  Pending Prescriptions Disp Refills   omeprazole (PRILOSEC) 20 MG capsule [Pharmacy Med Name: OMEPRAZOLE '20MG'$  CAPSULES] 90 capsule     Sig: TAKE 1 CAPSULE(20 MG) BY MOUTH DAILY     Gastroenterology: Proton Pump Inhibitors Passed - 08/10/2022  3:41 AM      Passed - Valid encounter within last 12 months    Recent Outpatient Visits           6 days ago Severe episode of recurrent major depressive disorder, without psychotic features ([place])   [place], Jolene T, NP   1 month ago Gastroesophageal reflux disease without esophagitis   [place], Jolene T, NP   6 months ago Severe episode of recurrent major depressive disorder, without psychotic features ([place])   [place], Jolene T, NP   9 months ago Severe episode of recurrent major depressive disorder, without psychotic features ([place])   [place], Jolene T, NP   1 year ago RLQ abdominal pain   [place], Barbaraann Faster, NP       Future Appointments             In 2 months [place], Barbaraann Faster, NP [facility]

## 2022-09-17 ENCOUNTER — Other Ambulatory Visit: Payer: Self-pay | Admitting: Nurse Practitioner

## 2022-09-17 NOTE — Telephone Encounter (Signed)
Unable to refill per protocol, request is too soon. Last refill 08/04/22 for 90 days and 4 refills. Will refuse.  Requested Prescriptions  Pending Prescriptions Disp Refills   sertraline (ZOLOFT) 100 MG tablet [Pharmacy Med Name: SERTRALINE '100MG'$  TABLETS] 90 tablet     Sig: TAKE 1 TABLET(100 MG) BY MOUTH AT BEDTIME     Psychiatry:  Antidepressants - SSRI - sertraline Passed - 09/17/2022  3:45 AM      Passed - AST in normal range and within 360 days    AST  Date Value Ref Range Status  08/04/2022 18 0 - 40 IU/L Final         Passed - ALT in normal range and within 360 days    ALT  Date Value Ref Range Status  08/04/2022 16 0 - 32 IU/L Final         Passed - Completed PHQ-2 or PHQ-9 in the last 360 days      Passed - Valid encounter within last 6 months    Recent Outpatient Visits           1 month ago Severe episode of recurrent major depressive disorder, without psychotic features ([place])   [place], Jolene T, NP   2 months ago Gastroesophageal reflux disease without esophagitis   [facility] [place], Jolene T, NP   7 months ago Severe episode of recurrent major depressive disorder, without psychotic features ([place])   [place], Jolene T, NP   10 months ago Severe episode of recurrent major depressive disorder, without psychotic features ([place])   [place], Jolene T, NP   1 year ago RLQ abdominal pain   [place], Barbaraann Faster, NP       Future Appointments             In 1 month [place], Barbaraann Faster, NP [facility]

## 2022-10-13 ENCOUNTER — Ambulatory Visit
Admission: EM | Admit: 2022-10-13 | Discharge: 2022-10-13 | Disposition: A | Discharge status: Home / Self Care | Attending: Emergency Medicine | Admitting: Emergency Medicine

## 2022-10-13 DIAGNOSIS — J0100 Acute maxillary sinusitis, unspecified: Secondary | ICD-10-CM

## 2022-10-13 MED ORDER — CEFDINIR 300 MG PO CAPS: 300.0000 mg | ORAL_CAPSULE | Freq: Two times a day (BID) | ORAL | 0 refills | Status: AC

## 2022-10-13 NOTE — ED Triage Notes (Signed)
Patient to Urgent Care with complaints of dry cough x1 week. Reports it is wet sounding but is unable to produce anything. Denies any known fevers.  Has been taking mucinex and sudafed.

## 2022-10-13 NOTE — Discharge Instructions (Addendum)
Take the cefdinir as directed.  Follow up with your primary care provider if your symptoms are not improving.    

## 2022-10-13 NOTE — ED Provider Notes (Signed)
Roderic Palau    CSN: [id number] Arrival date & time: 10/13/22  1139      History   Chief Complaint Chief Complaint  Patient presents with   Cough    HPI Stephanie Logan is a 32 y.o. female.  Patient presents with congestion, sinus pressure, postnasal drip, nonproductive cough x 1 week.  No fever, shortness of breath, chest pain, or other symptoms.  Treatment attempted with Mucinex and Sudafed.  Patient had an e-visit on 07/22/2022; diagnosed with acute sinusitis; treated with Augmentin.  Her medical history includes elevated cholesterol, GERD, kidney stones, migraine headaches, PCOS, depression, anxiety, long term benzodiazepine use.   The history is provided by the patient and medical records.    Past Medical History:  Diagnosis Date   Amenorrhea    Anxiety    Kidney stone    PCOS (polycystic ovarian syndrome)    Post partum depression     Patient Active Problem List   Diagnosis Date Noted   Gastroesophageal reflux disease without esophagitis 07/07/2022   Pelvic congestive syndrome 08/04/2021   Elevated low density lipoprotein (LDL) cholesterol level 06/24/2021   Controlled substance agreement signed 04/16/2021   Long term prescription benzodiazepine use 04/16/2021   Migraine with aura 04/16/2021   History of kidney stones 04/16/2021   History of abnormal mammogram 04/16/2021   Generalized anxiety disorder 04/12/2021   Liver hemangioma 08/01/2018   Vitamin B 12 deficiency 05/30/2018   History of IUFD 02/07/2017   Chronic pain of right knee 02/18/2016   Severe episode of recurrent major depressive disorder, without psychotic features ([place])    PCOS (polycystic ovarian syndrome) 07/19/2015    Past Surgical History:  Procedure Laterality Date   ABDOMINAL SURGERY  10/24/2021   vessel ablation   BREAST EXCISIONAL BIOPSY Right 2018   fibroadenoma   BREAST EXCISIONAL BIOPSY Left 2010   fibroadenoma   BREAST FIBROADENOMA SURGERY  2008,2010   EXTRACORPOREAL  SHOCK WAVE LITHOTRIPSY Right 05/16/2020   Procedure: EXTRACORPOREAL SHOCK WAVE LITHOTRIPSY (ESWL);  Surgeon: Abbie Sons, MD;  Location: [facility] ORS;  Service: Urology;  Laterality: Right;    OB History     Gravida  5   Para  2   Term  1   Preterm  1   AB  1   Living  3      SAB  1   IAB  0   Ectopic  0   Multiple  0   Live Births  3            Home Medications    Prior to Admission medications   Medication Sig Start Date End Date Taking? Authorizing Provider  cefdinir (OMNICEF) 300 MG capsule Take 1 capsule (300 mg total) by mouth 2 (two) times daily for 7 days. 10/13/22 10/20/22 Yes Sharion Balloon, NP  ALPRAZolam Duanne Moron) 0.5 MG tablet Take 1 tablet (0.5 mg total) by mouth 2 (two) times daily as needed for anxiety. 08/04/22   Cannady, Henrine Screws T, NP  meloxicam (MOBIC) 7.5 MG tablet TAKE 1 TABLET(7.5 MG) BY MOUTH DAILY 07/21/22   Cannady, Henrine Screws T, NP  omeprazole (PRILOSEC) 20 MG capsule Take 1 capsule (20 mg total) by mouth 2 (two) times daily before a meal. 08/04/22   Cannady, Jolene T, NP  rizatriptan (MAXALT-MLT) 5 MG disintegrating tablet Take 5 mg by mouth as needed for migraine. May repeat in 2 hours if needed    [provider]  sertraline (ZOLOFT) 100 MG tablet Take  2 tablets (200 mg total) by mouth daily. 08/04/22   Venita Lick, NP    Family History Family History  Problem Relation Age of Onset   Cancer Mother        cervical, precancerous   Cancer Father        Colon, Precancerous   Seizures Sister    Lung cancer Sister    Heart Problems Sister    Hashimoto's thyroiditis Brother    Learning disabilities Son    Cancer Maternal Grandmother        brain   Healthy Maternal Grandfather    Healthy Paternal Grandmother    Aneurysm Paternal Grandfather        Brain   Breast cancer Neg Hx     Social History Social History   Tobacco Use   Smoking status: Never    Passive exposure: Never   Smokeless tobacco: Never  Vaping Use    Vaping Use: Never used  Substance Use Topics   Alcohol use: Yes    Comment: occas   Drug use: No     Allergies   Mucinex dm [dm-guaifenesin er] and Ciprofloxacin   Review of Systems Review of Systems  Constitutional:  Negative for chills and fever.  HENT:  Positive for congestion, postnasal drip, rhinorrhea and sinus pressure. Negative for ear pain and sore throat.   Respiratory:  Positive for cough. Negative for shortness of breath.   Cardiovascular:  Negative for chest pain and palpitations.  Gastrointestinal:  Negative for diarrhea and vomiting.  Skin:  Negative for color change and rash.  All other systems reviewed and are negative.    Physical Exam Triage Vital Signs ED Triage Vitals  Enc Vitals Group     BP      Pulse      Resp      Temp      Temp src      SpO2      Weight      Height      Head Circumference      Peak Flow      Pain Score      Pain Loc      Pain Edu?      Excl. in [place]?    No data found.  Updated Vital Signs BP 108/73   Pulse 69   Temp 97.9 F (36.6 C)   Resp 18   Ht '5\' 3"'$  (1.6 m)   Wt 140 lb (63.5 kg)   LMP 09/22/2022   SpO2 97%   BMI 24.80 kg/m   Visual Acuity Right Eye Distance:   Left Eye Distance:   Bilateral Distance:    Right Eye Near:   Left Eye Near:    Bilateral Near:     Physical Exam Vitals and nursing note reviewed.  Constitutional:      General: She is not in acute distress.    Appearance: Normal appearance. She is well-developed. She is not ill-appearing.  HENT:     Right Ear: Tympanic membrane normal.     Left Ear: Tympanic membrane normal.     Nose: Congestion and rhinorrhea present.     Mouth/Throat:     Mouth: Mucous membranes are moist.     Pharynx: Oropharynx is clear.  Cardiovascular:     Rate and Rhythm: Normal rate and regular rhythm.     Heart sounds: Normal heart sounds.  Pulmonary:     Effort: Pulmonary effort is normal. No respiratory distress.  Breath sounds: Normal breath sounds.  No wheezing, rhonchi or rales.  Musculoskeletal:     Cervical back: Neck supple.  Skin:    General: Skin is warm and dry.  Neurological:     Mental Status: She is alert.  Psychiatric:        Mood and Affect: Mood normal.        Behavior: Behavior normal.      UC Treatments / Results  Labs (all labs ordered are listed, but only abnormal results are displayed) Labs Reviewed - No data to display  EKG   Radiology No results found.  Procedures Procedures (including critical care time)  Medications Ordered in UC Medications - No data to display  Initial Impression / Assessment and Plan / UC Course  I have reviewed the triage vital signs and the nursing notes.  Pertinent labs & imaging results that were available during my care of the patient were reviewed by me and considered in my medical decision making (see chart for details).    Acute sinusitis.  Patient has been symptomatic for 1 week and is not improving with OTC treatment.  Treating today with cefdinir.  Education provided on sinus infection.  Instructed patient to follow up with her PCP if her symptoms are not improving.  She agrees to plan of care.    Final Clinical Impressions(s) / UC Diagnoses   Final diagnoses:  Acute non-recurrent maxillary sinusitis     Discharge Instructions      Take the cefdinir as directed.  Follow up with your primary care provider if your symptoms are not improving.        ED Prescriptions     Medication Sig Dispense Auth. Provider   cefdinir (OMNICEF) 300 MG capsule Take 1 capsule (300 mg total) by mouth 2 (two) times daily for 7 days. 14 capsule Sharion Balloon, NP      PDMP not reviewed this encounter.   Sharion Balloon, NP 10/13/22 [phone]

## 2022-10-16 ENCOUNTER — Ambulatory Visit: Payer: Self-pay

## 2022-10-16 ENCOUNTER — Encounter: Payer: Self-pay | Admitting: Physician Assistant

## 2022-10-16 ENCOUNTER — Ambulatory Visit: Admitting: Physician Assistant

## 2022-10-16 VITALS — BP 93/60 | HR 77 | Temp 97.5°F | Wt 150.8 lb

## 2022-10-16 DIAGNOSIS — J019 Acute sinusitis, unspecified: Secondary | ICD-10-CM | POA: Diagnosis not present

## 2022-10-16 DIAGNOSIS — B9689 Other specified bacterial agents as the cause of diseases classified elsewhere: Secondary | ICD-10-CM | POA: Diagnosis not present

## 2022-10-16 DIAGNOSIS — R0789 Other chest pain: Secondary | ICD-10-CM

## 2022-10-16 DIAGNOSIS — J029 Acute pharyngitis, unspecified: Secondary | ICD-10-CM | POA: Diagnosis not present

## 2022-10-16 MED ORDER — PREDNISONE 20 MG PO TABS: ORAL_TABLET | ORAL | 0 refills | Status: DC

## 2022-10-16 NOTE — Telephone Encounter (Signed)
  Chief Complaint: chest pain  Symptoms: chest tightness when inhaling and at nighttime. Sinus pain and cough no improvement Frequency: ongoing since last Thursday Pertinent Negatives: Patient denies fever or SOB Disposition: '[]'$ ED /'[]'$ Urgent Care (no appt availability in office) / '[x]'$ Appointment(In office/virtual)/ '[]'$  [place] [facility]/ '[]'$ Home Care/ '[]'$ Refused Recommended Disposition /'[]'$ [place] [facility]/ '[]'$  Follow-up with PCP Additional Notes: pt went to UC on 10/13/22 and got Cefdinir for sinus infection. She states she doesn't feel any better and has chest tightness. Denies SOB or difficulty breathing. Has been taking abx and Mucinex. Scheduled FU OV today at 1440 with Erin, PA.   Reason for Disposition  [1] Taking antibiotic > 72 hours (3 days) AND [2] sinus pain not improved  Answer Assessment - Initial Assessment Questions 1. ANTIBIOTIC: "What antibiotic are you taking?" "How many times a day?"     Cefidinir BID x 7 days  2. ONSET: "When was the antibiotic started?"     Last Thursday  3. PAIN: "How bad is the sinus pain?"   (Scale 1-10; mild, moderate or severe)   - MILD (1-3): doesn't interfere with normal activities    - MODERATE (4-7): interferes with normal activities (e.g., work or school) or awakens from sleep   - SEVERE (8-10): excruciating pain and patient unable to do any normal activities        Moderate  4. FEVER: "Do you have a fever?" If Yes, ask: "What is it, how was it measured, and when did it start?"      no 5. SYMPTOMS: "Are there any other symptoms you're concerned about?" If Yes, ask: "When did it start?"     Sx not feeling any better, chest tightness when breathing in and at night time  Protocols used: Sinus Infection on Antibiotic Follow-up Call-[facility]

## 2022-10-16 NOTE — Progress Notes (Unsigned)
Acute Office Visit   Patient: Stephanie Logan   DOB: 1991-08-15   32 y.o. Female  MRN: [id number] Visit Date: 10/16/2022  Today's healthcare provider: Dani Gobble Lakaya Tolen, PA-C  Introduced myself to the patient as a Journalist, newspaper and provided education on APPs in clinical practice.    Chief Complaint  Patient presents with   URI    Pt states she has been having congestion on the right side of her face, non productive cough, sore throat, and fatigue.    Subjective    URI  Associated symptoms include congestion, headaches, sinus pain and a sore throat. Pertinent negatives include no diarrhea, ear pain or vomiting.   HPI     URI    Additional comments: Pt states she has been having congestion on the right side of her face, non productive cough, sore throat, and fatigue.       Last edited by Georgina Peer, CMA on 10/16/2022  2:57 PM.      URI- Type   Onset: gradual  Duration: Last Thurs  Associated symptoms: chest tightness, sinus pressure, sore throat,  States her symptoms have not improved much since starting Abx for her sinusitis  Interventions: she is taking Cedinir since Tues for sinus infection She has also been taking Mucinex max strength      Medications: Outpatient Medications Prior to Visit  Medication Sig   ALPRAZolam (XANAX) 0.5 MG tablet Take 1 tablet (0.5 mg total) by mouth 2 (two) times daily as needed for anxiety.   cefdinir (OMNICEF) 300 MG capsule Take 1 capsule (300 mg total) by mouth 2 (two) times daily for 7 days.   meloxicam (MOBIC) 7.5 MG tablet TAKE 1 TABLET(7.5 MG) BY MOUTH DAILY   omeprazole (PRILOSEC) 20 MG capsule Take 1 capsule (20 mg total) by mouth 2 (two) times daily before a meal.   rizatriptan (MAXALT-MLT) 5 MG disintegrating tablet Take 5 mg by mouth as needed for migraine. May repeat in 2 hours if needed   sertraline (ZOLOFT) 100 MG tablet Take 2 tablets (200 mg total) by mouth daily.   No facility-administered medications prior  to visit.    Review of Systems  Constitutional:  Positive for chills and fatigue. Negative for fever.  HENT:  Positive for congestion, sinus pressure, sinus pain and sore throat. Negative for ear pain and postnasal drip.   Respiratory:  Positive for chest tightness.   Gastrointestinal:  Negative for constipation, diarrhea and vomiting.  Musculoskeletal:  Positive for myalgias.  Neurological:  Positive for headaches.       Objective    BP 93/60   Pulse 77   Temp (!) 97.5 F (36.4 C) (Oral)   Wt 150 lb 12.8 oz (68.4 kg)   LMP 09/22/2022   SpO2 98%   BMI 26.71 kg/m    Physical Exam Vitals reviewed.  Constitutional:      General: She is awake.     Appearance: Normal appearance. She is well-developed and well-groomed.  HENT:     Head: Normocephalic and atraumatic.     Right Ear: Hearing, tympanic membrane and ear canal normal.     Left Ear: Hearing and ear canal normal. Tympanic membrane is erythematous.     Mouth/Throat:     Lips: Pink.     Mouth: Mucous membranes are moist.     Pharynx: Oropharynx is clear. Uvula midline. No oropharyngeal exudate or posterior oropharyngeal erythema.  Eyes:  Extraocular Movements: Extraocular movements intact.     Conjunctiva/sclera: Conjunctivae normal.  Cardiovascular:     Rate and Rhythm: Normal rate and regular rhythm.     Heart sounds: Normal heart sounds. No murmur heard.    No friction rub. No gallop.  Pulmonary:     Effort: Pulmonary effort is normal.     Breath sounds: Normal breath sounds. No decreased air movement. No decreased breath sounds, wheezing, rhonchi or rales.  Musculoskeletal:     Cervical back: Normal range of motion. Normal range of motion.  Neurological:     General: No focal deficit present.     Mental Status: She is alert and oriented to person, place, and time.  Psychiatric:        Mood and Affect: Mood normal.        Behavior: Behavior normal. Behavior is cooperative.        Thought Content: Thought  content normal.        Judgment: Judgment normal.       No results found for any visits on 10/16/22.  Assessment & Plan      No follow-ups on file.      Problem List Items Addressed This Visit   None Visit Diagnoses     Feeling of chest tightness    -  Primary Acute, new concern  Likely secondary to current bacterial sinusitis and coughing Will provide prednisone taper to assist with symptoms  Recommend she continue abx course until completed Follow up as needed for persistent or progressing symptoms    Relevant Medications   predniSONE (DELTASONE) 20 MG tablet   Acute bacterial sinusitis     Acute, new concern She was seen for this at [facility] on 10/13/22 and provided with Cefdinir for management  Recommend she continue with abx therapy and reviewed OTC medications to further assist with symptomatic management Will provide Prednisone taper for chest tightness Follow up as needed    Relevant Medications   predniSONE (DELTASONE) 20 MG tablet   Sore throat     Acute, new concern She is taking Cefdinir- on day 3 of course so will not test for Strep at this time Reviewed this decision with her and informed her that this abx is effective against strep if she did have it  She agrees with decision not to test at this time Reviewed OTC management options with her for sore throat. Follow up as needed         No follow-ups on file.   I, Halil Rentz E Nivin Braniff, PA-C, have reviewed all documentation for this visit. The documentation on 10/19/22 for the exam, diagnosis, procedures, and orders are all accurate and complete.   Talitha Givens, MHS, PA-C [facility]

## 2022-11-01 NOTE — Patient Instructions (Signed)

## 2022-11-04 ENCOUNTER — Ambulatory Visit: Admitting: Nurse Practitioner

## 2022-11-04 ENCOUNTER — Encounter: Payer: Self-pay | Admitting: Nurse Practitioner

## 2022-11-04 VITALS — BP 92/59 | HR 83 | Temp 98.3°F | Ht 63.5 in | Wt 151.3 lb

## 2022-11-04 DIAGNOSIS — F411 Generalized anxiety disorder: Secondary | ICD-10-CM

## 2022-11-04 DIAGNOSIS — Z79899 Other long term (current) drug therapy: Secondary | ICD-10-CM

## 2022-11-04 DIAGNOSIS — F332 Major depressive disorder, recurrent severe without psychotic features: Secondary | ICD-10-CM | POA: Diagnosis not present

## 2022-11-04 MED ORDER — ALPRAZOLAM 0.5 MG PO TABS: 0.5000 mg | ORAL_TABLET | Freq: Two times a day (BID) | ORAL | 2 refills | Status: DC | PRN

## 2022-11-04 MED ORDER — BUPROPION HCL ER (XL) 150 MG PO TB24: 150.0000 mg | ORAL_TABLET | Freq: Every day | ORAL | 1 refills | Status: DC

## 2022-11-04 NOTE — Assessment & Plan Note (Signed)
Educated at length on risks of long term use -- refer to anxiety plan.  UDS due 08/05/23 and controlled subs contract up to date July 2022.

## 2022-11-04 NOTE — Assessment & Plan Note (Signed)
Refer to depression plan of care for further.

## 2022-11-04 NOTE — Progress Notes (Signed)
BP (!) 92/59   Pulse 83   Temp 98.3 F (36.8 C) (Oral)   Ht 5' 3.5" (1.613 m)   Wt 151 lb 4.8 oz (68.6 kg)   LMP 09/22/2022   SpO2 98%   BMI 26.38 kg/m    Subjective:    Patient ID: Stephanie Logan, female    DOB: 10-14-1990, 32 y.o.   MRN: [id number]  HPI: Stephanie Logan is a 32 y.o. female  Chief Complaint  Patient presents with   Anxiety    Patient states that Anxiety is the same, no worse no better   ANXIETY/STRESS Currently taking Zoloft 200 MG daily and Xanax as needed 0.5 MG PRN. Xanax she uses about 3 times a week, often at nigh - last fill 04/23/22.  Pt is aware of risks of benzo medication use to include increased sedation, respiratory suppression, falls, dependence and cardiovascular events.  Pt would like to continue treatment as benefit determined to outweigh risk.   Does notice lack of motivation and difficulty concentrating + anhedonia.    Does not take Xanax during week days due to fatigue with this. Has more seasonal depression. Is currently taking Melatonin daily for sleep.  Can not take Trazodone, makes her sleepy.  In past has tried Prozac and Klonopin.     Has been on this regimen for > 6 years started by OB/GYN. Has struggled with anxiety/depression her whole life, this worsened after 2nd child was born.  Her husband is double amputee, lost legs in [place]. Has history of stillborn child.  A referral to psychiatry was placed on 11/06/21 for further evaluation and assessment for ADD, she never attended this. Has attended therapy in past, did find helpful but at this time mood makes motivation difficult and getting out of house. Duration:exacerbated Anxious mood: yes -- finger picks a lot at baseline Excessive worrying: yes Irritability: yes  Sweating: with recent panic attacks Nausea: no Palpitations:no Hyperventilation: no Panic attacks: yes x 2 last week Agoraphobia: no  Obscessions/compulsions: no Depressed mood: yes    14-Nov-2022    3:21 PM  08/04/2022    4:11 PM 07/07/2022    4:34 PM 01/27/2022    8:54 AM 11/06/2021   11:14 AM  Depression screen PHQ 2/9  Decreased Interest 1 1 1 1 1  $ Down, Depressed, Hopeless 1 1 0 2 2  PHQ - 2 Score 2 2 1 3 3  $ Altered sleeping 2 2 2 2 3  $ Tired, decreased energy 2 2 1 3 3  $ Change in appetite 2 0 2 2 1  $ Feeling bad or failure about yourself   2 0 3 1  Trouble concentrating 2 3 0 2 3  Moving slowly or fidgety/restless 0 0 0 0 0  Suicidal thoughts 0 0 0 0 0  PHQ-9 Score 10 11 6 15 14  $ Difficult doing work/chores Somewhat difficult Somewhat difficult Somewhat difficult    Anhedonia: yes Weight changes: no Insomnia: yes hard to stay asleep  Hypersomnia: no Fatigue/loss of energy: yes Feelings of worthlessness: yes Feelings of guilt: yes Impaired concentration/indecisiveness: yes Suicidal ideations: no  Crying spells: no Recent Stressors/Life Changes: yes   Relationship problems: no   Family stress: yes  -- mother and relationship with her   Financial stress: no    Job stress: no    Recent death/loss: no     11/14/2022    3:22 PM 08/04/2022    4:11 PM 07/07/2022    4:34 PM 01/27/2022  8:54 AM  GAD 7 : Generalized Anxiety Score  Nervous, Anxious, on Edge 2 2 2 3  $ Control/stop worrying 2 2 2 3  $ Worry too much - different things 2 2 2 3  $ Trouble relaxing 2 2 2 3  $ Restless 2 1 2 3  $ Easily annoyed or irritable 2 3 2 3  $ Afraid - awful might happen 3 2 2 3  $ Total GAD 7 Score 15 14 14 21  $ Anxiety Difficulty Somewhat difficult Somewhat difficult Somewhat difficult Very difficult   Relevant past medical, surgical, family and social history reviewed and updated as indicated. Interim medical history since our last visit reviewed. Allergies and medications reviewed and updated.  Review of Systems  Constitutional:  Negative for activity change, appetite change, diaphoresis, fatigue and fever.  Respiratory:  Negative for cough, chest tightness, shortness of breath and wheezing.    Cardiovascular:  Negative for chest pain, palpitations and leg swelling.  Gastrointestinal: Negative.   Endocrine: Negative.   Neurological: Negative.   Psychiatric/Behavioral:  Positive for sleep disturbance. Negative for decreased concentration, self-injury and suicidal ideas. The patient is nervous/anxious.    Per HPI unless specifically indicated above     Objective:    BP (!) 92/59   Pulse 83   Temp 98.3 F (36.8 C) (Oral)   Ht 5' 3.5" (1.613 m)   Wt 151 lb 4.8 oz (68.6 kg)   LMP 09/22/2022   SpO2 98%   BMI 26.38 kg/m   Wt Readings from Last 3 Encounters:  11/04/22 151 lb 4.8 oz (68.6 kg)  10/16/22 150 lb 12.8 oz (68.4 kg)  10/13/22 140 lb (63.5 kg)    Physical Exam Vitals and nursing note reviewed.  Constitutional:      General: She is awake. She is not in acute distress.    Appearance: She is well-developed and well-groomed. She is not ill-appearing or toxic-appearing.  HENT:     Head: Normocephalic.     Right Ear: Hearing normal.     Left Ear: Hearing normal.  Eyes:     General: Lids are normal.        Right eye: No discharge.        Left eye: No discharge.     Conjunctiva/sclera: Conjunctivae normal.     Pupils: Pupils are equal, round, and reactive to light.  Neck:     Thyroid: No thyromegaly.     Vascular: No carotid bruit.  Cardiovascular:     Rate and Rhythm: Normal rate and regular rhythm.     Heart sounds: Normal heart sounds. No murmur heard.    No gallop.  Pulmonary:     Effort: Pulmonary effort is normal. No accessory muscle usage or respiratory distress.     Breath sounds: Normal breath sounds.  Abdominal:     General: Bowel sounds are normal. There is no distension.     Palpations: Abdomen is soft. There is no hepatomegaly.     Tenderness: There is no abdominal tenderness. There is no right CVA tenderness or left CVA tenderness.  Musculoskeletal:     Cervical back: Normal range of motion and neck supple.     Right lower leg: No edema.      Left lower leg: No edema.  Lymphadenopathy:     Cervical: No cervical adenopathy.  Skin:    General: Skin is warm and dry.  Neurological:     Mental Status: She is alert and oriented to person, place, and time.     Motor:  Motor function is intact.     Coordination: Coordination is intact.     Gait: Gait is intact.     Deep Tendon Reflexes: Reflexes are normal and symmetric.     Reflex Scores:      Brachioradialis reflexes are 2+ on the right side and 2+ on the left side.      Patellar reflexes are 2+ on the right side and 2+ on the left side. Psychiatric:        Attention and Perception: Attention normal.        Mood and Affect: Mood normal.        Speech: Speech normal.        Behavior: Behavior normal. Behavior is cooperative.        Thought Content: Thought content normal.    Results for orders placed or performed in visit on 08/04/22  [id number] 11+Oxyco+Alc+Crt-Bund  Result Value Ref Range   Ethanol Negative Cutoff=0.020 %   Amphetamines, Urine Negative Cutoff=1000 ng/mL   Barbiturate Negative Cutoff=200 ng/mL   BENZODIAZ UR QL Negative Cutoff=200 ng/mL   Cannabinoid Quant, Ur Negative Cutoff=50 ng/mL   Cocaine (Metabolite) Negative Cutoff=300 ng/mL   OPIATE SCREEN URINE Negative Cutoff=300 ng/mL   Oxycodone/Oxymorphone, Urine Negative Cutoff=300 ng/mL   Phencyclidine Negative Cutoff=25 ng/mL   Methadone Screen, Urine Negative Cutoff=300 ng/mL   Propoxyphene Negative Cutoff=300 ng/mL   Meperidine Negative Cutoff=200 ng/mL   Tramadol Negative Cutoff=200 ng/mL   Creatinine 65.5 20.0 - 300.0 mg/dL   pH, Urine 6.5 4.5 - 8.9  CBC with Differential/Platelet  Result Value Ref Range   WBC 8.8 3.4 - 10.8 x10E3/uL   RBC 4.63 3.77 - 5.28 x10E6/uL   Hemoglobin 14.1 11.1 - 15.9 g/dL   Hematocrit 40.3 34.0 - 46.6 %   MCV 87 79 - 97 fL   MCH 30.5 26.6 - 33.0 pg   MCHC 35.0 31.5 - 35.7 g/dL   RDW 12.5 11.7 - 15.4 %   Platelets 308 150 - 450 x10E3/uL   Neutrophils 64 Not Estab. %    Lymphs 26 Not Estab. %   Monocytes 7 Not Estab. %   Eos 2 Not Estab. %   Basos 1 Not Estab. %   Neutrophils Absolute 5.7 1.4 - 7.0 x10E3/uL   Lymphocytes Absolute 2.2 0.7 - 3.1 x10E3/uL   Monocytes Absolute 0.6 0.1 - 0.9 x10E3/uL   EOS (ABSOLUTE) 0.2 0.0 - 0.4 x10E3/uL   Basophils Absolute 0.1 0.0 - 0.2 x10E3/uL   Immature Granulocytes 0 Not Estab. %   Immature Grans (Abs) 0.0 0.0 - 0.1 x10E3/uL  Comprehensive metabolic panel  Result Value Ref Range   Glucose 82 70 - 99 mg/dL   BUN 16 6 - 20 mg/dL   Creatinine, Ser 0.74 0.57 - 1.00 mg/dL   eGFR 109 >59 mL/min/1.73   BUN/Creatinine Ratio 22 9 - 23   Sodium 137 134 - 144 mmol/L   Potassium 4.0 3.5 - 5.2 mmol/L   Chloride 101 96 - 106 mmol/L   CO2 22 20 - 29 mmol/L   Calcium 9.9 8.7 - 10.2 mg/dL   Total Protein 7.6 6.0 - 8.5 g/dL   Albumin 5.0 (H) 3.9 - 4.9 g/dL   Globulin, Total 2.6 1.5 - 4.5 g/dL   Albumin/Globulin Ratio 1.9 1.2 - 2.2   Bilirubin Total 0.3 0.0 - 1.2 mg/dL   Alkaline Phosphatase 61 44 - 121 IU/L   AST 18 0 - 40 IU/L   ALT 16 0 - 32 IU/L  Lipid Panel w/o Chol/HDL Ratio  Result Value Ref Range   Cholesterol, Total 203 (H) 100 - 199 mg/dL   Triglycerides 109 0 - 149 mg/dL   HDL 57 >39 mg/dL   VLDL Cholesterol Cal 19 5 - 40 mg/dL   LDL Chol Calc (NIH) 127 (H) 0 - 99 mg/dL  TSH  Result Value Ref Range   TSH 1.730 0.450 - 4.500 uIU/mL  HgB A1c  Result Value Ref Range   Hgb A1c MFr Bld 5.2 4.8 - 5.6 %   Est. average glucose Bld gHb Est-mCnc 103 mg/dL  Vitamin B12  Result Value Ref Range   Vitamin B-12 455 232 - 1,245 pg/mL  Hepatitis C antibody  Result Value Ref Range   Hep C Virus Ab Non Reactive Non Reactive  Gamma GT  Result Value Ref Range   GGT 14 0 - 60 IU/L      Assessment & Plan:   Problem List Items Addressed This Visit       Other   Generalized anxiety disorder    Refer to depression plan of care for further.      Relevant Medications   buPROPion (WELLBUTRIN XL) 150 MG 24 hr  tablet   ALPRAZolam (XANAX) 0.5 MG tablet   Long term prescription benzodiazepine use    Educated at length on risks of long term use -- refer to anxiety plan.  UDS due 08/05/23 and controlled subs contract up to date July 2022.      Severe episode of recurrent major depressive disorder, without psychotic features ([place]) - Primary    Chronic, ongoing.  Denies SI/HI.  At this time will continue Zoloft 200 MG daily and maintain Xanax as ordered, has been on for >6 years and does not want to change.  Start Wellbutrin XL 150 MG daily, which may offer benefit to concentration and motivation + anhedonia.  Educated her on this medication, including use + side effects.  Discussed BLACK BOX warning and recommend she immediately alert PCP or go to ER if SI presents. Recommend return to therapy in future, which could offer benefit to mood.  UDS due 08/05/23 and controlled substance agreement up to date.  Return in 4 weeks.       Relevant Medications   buPROPion (WELLBUTRIN XL) 150 MG 24 hr tablet   ALPRAZolam (XANAX) 0.5 MG tablet     Follow up plan: Return in about 4 weeks (around 12/02/2022) for DEPRESSION AND ANXIETY.

## 2022-11-04 NOTE — Assessment & Plan Note (Addendum)
Chronic, ongoing.  Denies SI/HI.  At this time will continue Zoloft 200 MG daily and maintain Xanax as ordered, has been on for >6 years and does not want to change.  Start Wellbutrin XL 150 MG daily, which may offer benefit to concentration and motivation + anhedonia.  Educated her on this medication, including use + side effects.  Discussed BLACK BOX warning and recommend she immediately alert PCP or go to ER if SI presents. Recommend return to therapy in future, which could offer benefit to mood.  UDS due 08/05/23 and controlled substance agreement up to date.  Return in 4 weeks.

## 2022-11-29 NOTE — Patient Instructions (Signed)
Managing Depression, Adult Depression is a mental health condition that affects your thoughts, feelings, and actions. Being diagnosed with depression can bring you relief if you did not know why you have felt or behaved a certain way. It could also leave you feeling overwhelmed. Finding ways to manage your symptoms can help you feel more positive about your future. How to manage lifestyle changes Being depressed is difficult. Depression can increase the level of everyday stress. Stress can make depression symptoms worse. You may believe your symptoms cannot be managed or will never improve. However, there are many things you can try to help manage your symptoms. There is hope. Managing stress  Stress is your body's reaction to life changes and events, both good and bad. Stress can add to your feelings of depression. Learning to manage your stress can help lessen your feelings of depression. Try some of the following approaches to reducing your stress (stress reduction techniques): Listen to music that you enjoy and that inspires you. Try using a meditation app or take a meditation class. Develop a practice that helps you connect with your spiritual self. Walk in nature, pray, or go to a place of worship. Practice deep breathing. To do this, inhale slowly through your nose. Pause at the top of your inhale for a few seconds and then exhale slowly, letting yourself relax. Repeat this three or four times. Practice yoga to help relax and work your muscles. Choose a stress reduction technique that works for you. These techniques take time and practice to develop. Set aside 5-15 minutes a day to do them. Therapists can offer training in these techniques. Do these things to help manage stress: Keep a journal. Know your limits. Set healthy boundaries for yourself and others, such as saying "no" when you think something is too much. Pay attention to how you react to certain situations. You may not be able to  control everything, but you can change your reaction. Add humor to your life by watching funny movies or shows. Make time for activities that you enjoy and that relax you. Spend less time using electronics, especially at night before bed. The light from screens can make your brain think it is time to get up rather than go to bed.  Medicines Medicines, such as antidepressants, are often a part of treatment for depression. Talk with your pharmacist or health care provider about all the medicines, supplements, and herbal products that you take, their possible side effects, and what medicines and other products are safe to take together. Make sure to report any side effects you may have to your health care provider. Relationships Your health care provider may suggest family therapy, couples therapy, or individual therapy as part of your treatment. How to recognize changes Everyone responds differently to treatment for depression. As you recover from depression, you may start to: Have more interest in doing activities. Feel more hopeful. Have more energy. Eat a more regular amount of food. Have better mental focus. It is important to recognize if your depression is not getting better or is getting worse. The symptoms you had in the beginning may return, such as: Feeling tired. Eating too much or too little. Sleeping too much or too little. Feeling restless, agitated, or hopeless. Trouble focusing or making decisions. Having unexplained aches and pains. Feeling irritable, angry, or aggressive. If you or your family members notice these symptoms coming back, let your health care provider know right away. Follow these instructions at home: Activity Try to   get some form of exercise each day, such as walking. Try yoga, mindfulness, or other stress reduction techniques. Participate in group activities if you are able. Lifestyle Get enough sleep. Cut down on or stop using caffeine, tobacco,  alcohol, and any other harmful substances. Eat a healthy diet that includes plenty of vegetables, fruits, whole grains, low-fat dairy products, and lean protein. Limit foods that are high in solid fats, added sugar, or salt (sodium). General instructions Take over-the-counter and prescription medicines only as told by your health care provider. Keep all follow-up visits. It is important for your health care provider to check on your mood, behavior, and medicines. Your health care provider may need to make changes to your treatment. Where to find support Talking to others  Friends and family members can be sources of support and guidance. Talk to trusted friends or family members about your condition. Explain your symptoms and let them know that you are working with a health care provider to treat your depression. Tell friends and family how they can help. Finances Find mental health providers that fit with your financial situation. Talk with your health care provider if you are worried about access to food, housing, or medicine. Call your insurance company to learn about your co-pays and prescription plan. Where to find more information You can find support in your area from: Anxiety and Depression Association of America (ADAA): adaa.org Mental Health America: mentalhealthamerica.net National Alliance on Mental Illness: nami.org Contact a health care provider if: You stop taking your antidepressant medicines, and you have any of these symptoms: Nausea. Headache. Light-headedness. Chills and body aches. Not being able to sleep (insomnia). You or your friends and family think your depression is getting worse. Get help right away if: You have thoughts of hurting yourself or others. Get help right away if you feel like you may hurt yourself or others, or have thoughts about taking your own life. Go to your nearest emergency room or: Call 911. Call the National Suicide Prevention Lifeline at  1-800-273-8255 or 988. This is open 24 hours a day. Text the Crisis Text Line at 741741. This information is not intended to replace advice given to you by your health care provider. Make sure you discuss any questions you have with your health care provider. Document Revised: 01/13/2022 Document Reviewed: 01/13/2022 Elsevier Patient Education  2023 [employer] Inc.  

## 2022-12-02 ENCOUNTER — Encounter: Payer: Self-pay | Admitting: Nurse Practitioner

## 2022-12-02 ENCOUNTER — Ambulatory Visit: Admitting: Nurse Practitioner

## 2022-12-02 VITALS — BP 96/64 | HR 81 | Temp 98.3°F | Ht 63.5 in | Wt 151.6 lb

## 2022-12-02 DIAGNOSIS — F411 Generalized anxiety disorder: Secondary | ICD-10-CM

## 2022-12-02 DIAGNOSIS — F332 Major depressive disorder, recurrent severe without psychotic features: Secondary | ICD-10-CM

## 2022-12-02 MED ORDER — BUPROPION HCL ER (XL) 300 MG PO TB24: 300.0000 mg | ORAL_TABLET | Freq: Every day | ORAL | 5 refills | Status: DC

## 2022-12-02 NOTE — Assessment & Plan Note (Signed)
Chronic, ongoing.  Denies SI/HI.  At this time will continue Zoloft 200 MG daily and maintain Xanax as ordered, has been on for >6 years and does not want to change.  Increase Wellbutrin XL to 300 MG daily, as is offering some improvement to concentration and motivation + anhedonia.  Educated her on this medication, including use + side effects.  Discussed BLACK BOX warning and recommend she immediately alert PCP or go to ER if SI presents. Recommend return to therapy in future, which could offer benefit to mood - will consider this referral next visit if mood continues to improve.  UDS due 08/05/23 and controlled substance agreement up to date.  Return in 4 weeks.

## 2022-12-02 NOTE — Progress Notes (Signed)
BP 96/64   Pulse 81   Temp 98.3 F (36.8 C) (Oral)   Ht 5' 3.5" (1.613 m)   Wt 151 lb 9.6 oz (68.8 kg)   SpO2 98%   BMI 26.43 kg/m    Subjective:    Patient ID: Stephanie Logan, female    DOB: [birth year], 31 y.o.   MRN: [id number]  HPI: Stephanie Logan is a 32 y.o. female  Chief Complaint  Patient presents with   Anxiety   Depression    New meds are working well   ANXIETY/STRESS Follow-up today for mood, added on Wellbutrin XL 150 MG daily on 11/04/22 -- feeling better with this on board, can get head above clouds most days.  Has had no side effects.  Continues on Zoloft 200 MG daily and Xanax as needed 0.5 MG PRN. Xanax she uses about 3 times a week, often at nigh - last fill 04/23/22.  Pt is aware of risks of benzo medication use to include increased sedation, respiratory suppression, falls, dependence and cardiovascular events.  Pt would like to continue treatment as benefit determined to outweigh risk.    Does not take Xanax during week days due to fatigue with this. Has more seasonal depression. Is currently taking Melatonin daily for sleep.  Can not take Trazodone, makes her sleepy.  In past has tried Prozac and Klonopin.     History: Has been on this regimen for > 6 years started by OB/GYN. Has struggled with anxiety/depression her whole life, this worsened after 2nd child was born.  Her husband is double amputee, lost legs in [place]. Has history of stillborn child.  A referral to psychiatry was placed on 11/06/21 for further evaluation and assessment for ADD, she never attended this. Has attended therapy in past. Duration: improving mood, reports feeling better Anxious mood: yes  Excessive worrying: improving somewhat Irritability: yes, slightly improving Sweating: no Nausea: no Palpitations:no Hyperventilation: no Panic attacks: no Agoraphobia: no  Obscessions/compulsions: no Depressed mood: yes, improving    23-Dec-2022    1:15 PM 11/04/2022    3:21 PM  08/04/2022    4:11 PM 07/07/2022    4:34 PM 01/27/2022    8:54 AM  Depression screen PHQ 2/9  Decreased Interest '1 1 1 1 1  '$ Down, Depressed, Hopeless '1 1 1 '$ 0 2  PHQ - 2 Score '2 2 2 1 3  '$ Altered sleeping '3 2 2 2 2  '$ Tired, decreased energy '1 2 2 1 3  '$ Change in appetite 2 2 0 2 2  Feeling bad or failure about yourself  1  2 0 3  Trouble concentrating '2 2 3 '$ 0 2  Moving slowly or fidgety/restless 0 0 0 0 0  Suicidal thoughts 0 0 0 0 0  PHQ-9 Score '11 10 11 6 15  '$ Difficult doing work/chores Somewhat difficult Somewhat difficult Somewhat difficult Somewhat difficult   Anhedonia: yes, improving Weight changes: no Insomnia: yes hard to stay asleep  Hypersomnia: no Fatigue/loss of energy: yes, improving Feelings of worthlessness: no Feelings of guilt: no Impaired concentration/indecisiveness: yes Suicidal ideations: no  Crying spells: no Recent Stressors/Life Changes: yes   Relationship problems: no   Family stress: yes  -- mother and relationship with her   Financial stress: no    Job stress: no    Recent death/loss: no     2022/12/23    1:16 PM 11/04/2022    3:22 PM 08/04/2022    4:11 PM 07/07/2022    4:34  PM  GAD 7 : Generalized Anxiety Score  Nervous, Anxious, on Edge '1 2 2 2  '$ Control/stop worrying '1 2 2 2  '$ Worry too much - different things '1 2 2 2  '$ Trouble relaxing '1 2 2 2  '$ Restless '1 2 1 2  '$ Easily annoyed or irritable '2 2 3 2  '$ Afraid - awful might happen '3 3 2 2  '$ Total GAD 7 Score '10 15 14 14  '$ Anxiety Difficulty Somewhat difficult Somewhat difficult Somewhat difficult Somewhat difficult   Relevant past medical, surgical, family and social history reviewed and updated as indicated. Interim medical history since our last visit reviewed. Allergies and medications reviewed and updated.  Review of Systems  Constitutional:  Negative for activity change, appetite change, diaphoresis, fatigue and fever.  Respiratory:  Negative for cough, chest tightness, shortness of breath and  wheezing.   Cardiovascular:  Negative for chest pain, palpitations and leg swelling.  Gastrointestinal: Negative.   Endocrine: Negative.   Neurological: Negative.   Psychiatric/Behavioral:  Positive for sleep disturbance. Negative for decreased concentration, self-injury and suicidal ideas. The patient is nervous/anxious.    Per HPI unless specifically indicated above     Objective:    BP 96/64   Pulse 81   Temp 98.3 F (36.8 C) (Oral)   Ht 5' 3.5" (1.613 m)   Wt 151 lb 9.6 oz (68.8 kg)   SpO2 98%   BMI 26.43 kg/m   Wt Readings from Last 3 Encounters:  12/02/22 151 lb 9.6 oz (68.8 kg)  11/04/22 151 lb 4.8 oz (68.6 kg)  10/16/22 150 lb 12.8 oz (68.4 kg)    Physical Exam Vitals and nursing note reviewed.  Constitutional:      General: She is awake. She is not in acute distress.    Appearance: She is well-developed and well-groomed. She is not ill-appearing or toxic-appearing.  HENT:     Head: Normocephalic.     Right Ear: Hearing normal.     Left Ear: Hearing normal.  Eyes:     General: Lids are normal.        Right eye: No discharge.        Left eye: No discharge.     Conjunctiva/sclera: Conjunctivae normal.     Pupils: Pupils are equal, round, and reactive to light.  Neck:     Thyroid: No thyromegaly.     Vascular: No carotid bruit.  Cardiovascular:     Rate and Rhythm: Normal rate and regular rhythm.     Heart sounds: Normal heart sounds. No murmur heard.    No gallop.  Pulmonary:     Effort: Pulmonary effort is normal. No accessory muscle usage or respiratory distress.     Breath sounds: Normal breath sounds.  Abdominal:     General: Bowel sounds are normal. There is no distension.     Palpations: Abdomen is soft. There is no hepatomegaly.     Tenderness: There is no abdominal tenderness. There is no right CVA tenderness or left CVA tenderness.  Musculoskeletal:     Cervical back: Normal range of motion and neck supple.     Right lower leg: No edema.      Left lower leg: No edema.  Lymphadenopathy:     Cervical: No cervical adenopathy.  Skin:    General: Skin is warm and dry.  Neurological:     Mental Status: She is alert and oriented to person, place, and time.     Motor: Motor function is intact.  Coordination: Coordination is intact.     Gait: Gait is intact.     Deep Tendon Reflexes: Reflexes are normal and symmetric.     Reflex Scores:      Brachioradialis reflexes are 2+ on the right side and 2+ on the left side.      Patellar reflexes are 2+ on the right side and 2+ on the left side. Psychiatric:        Attention and Perception: Attention normal.        Mood and Affect: Mood normal.        Speech: Speech normal.        Behavior: Behavior normal. Behavior is cooperative.        Thought Content: Thought content normal.    Results for orders placed or performed in visit on 08/04/22  [id number] 11+Oxyco+Alc+Crt-Bund  Result Value Ref Range   Ethanol Negative Cutoff=0.020 %   Amphetamines, Urine Negative Cutoff=1000 ng/mL   Barbiturate Negative Cutoff=200 ng/mL   BENZODIAZ UR QL Negative Cutoff=200 ng/mL   Cannabinoid Quant, Ur Negative Cutoff=50 ng/mL   Cocaine (Metabolite) Negative Cutoff=300 ng/mL   OPIATE SCREEN URINE Negative Cutoff=300 ng/mL   Oxycodone/Oxymorphone, Urine Negative Cutoff=300 ng/mL   Phencyclidine Negative Cutoff=25 ng/mL   Methadone Screen, Urine Negative Cutoff=300 ng/mL   Propoxyphene Negative Cutoff=300 ng/mL   Meperidine Negative Cutoff=200 ng/mL   Tramadol Negative Cutoff=200 ng/mL   Creatinine 65.5 20.0 - 300.0 mg/dL   pH, Urine 6.5 4.5 - 8.9  CBC with Differential/Platelet  Result Value Ref Range   WBC 8.8 3.4 - 10.8 x10E3/uL   RBC 4.63 3.77 - 5.28 x10E6/uL   Hemoglobin 14.1 11.1 - 15.9 g/dL   Hematocrit 40.3 34.0 - 46.6 %   MCV 87 79 - 97 fL   MCH 30.5 26.6 - 33.0 pg   MCHC 35.0 31.5 - 35.7 g/dL   RDW 12.5 11.7 - 15.4 %   Platelets 308 150 - 450 x10E3/uL   Neutrophils 64 Not Estab. %    Lymphs 26 Not Estab. %   Monocytes 7 Not Estab. %   Eos 2 Not Estab. %   Basos 1 Not Estab. %   Neutrophils Absolute 5.7 1.4 - 7.0 x10E3/uL   Lymphocytes Absolute 2.2 0.7 - 3.1 x10E3/uL   Monocytes Absolute 0.6 0.1 - 0.9 x10E3/uL   EOS (ABSOLUTE) 0.2 0.0 - 0.4 x10E3/uL   Basophils Absolute 0.1 0.0 - 0.2 x10E3/uL   Immature Granulocytes 0 Not Estab. %   Immature Grans (Abs) 0.0 0.0 - 0.1 x10E3/uL  Comprehensive metabolic panel  Result Value Ref Range   Glucose 82 70 - 99 mg/dL   BUN 16 6 - 20 mg/dL   Creatinine, Ser 0.74 0.57 - 1.00 mg/dL   eGFR 109 >59 mL/min/1.73   BUN/Creatinine Ratio 22 9 - 23   Sodium 137 134 - 144 mmol/L   Potassium 4.0 3.5 - 5.2 mmol/L   Chloride 101 96 - 106 mmol/L   CO2 22 20 - 29 mmol/L   Calcium 9.9 8.7 - 10.2 mg/dL   Total Protein 7.6 6.0 - 8.5 g/dL   Albumin 5.0 (H) 3.9 - 4.9 g/dL   Globulin, Total 2.6 1.5 - 4.5 g/dL   Albumin/Globulin Ratio 1.9 1.2 - 2.2   Bilirubin Total 0.3 0.0 - 1.2 mg/dL   Alkaline Phosphatase 61 44 - 121 IU/L   AST 18 0 - 40 IU/L   ALT 16 0 - 32 IU/L  Lipid Panel w/o Chol/HDL Ratio  Result Value  Ref Range   Cholesterol, Total 203 (H) 100 - 199 mg/dL   Triglycerides 109 0 - 149 mg/dL   HDL 57 >39 mg/dL   VLDL Cholesterol Cal 19 5 - 40 mg/dL   LDL Chol Calc (NIH) 127 (H) 0 - 99 mg/dL  TSH  Result Value Ref Range   TSH 1.730 0.450 - 4.500 uIU/mL  HgB A1c  Result Value Ref Range   Hgb A1c MFr Bld 5.2 4.8 - 5.6 %   Est. average glucose Bld gHb Est-mCnc 103 mg/dL  Vitamin B12  Result Value Ref Range   Vitamin B-12 455 232 - 1,245 pg/mL  Hepatitis C antibody  Result Value Ref Range   Hep C Virus Ab Non Reactive Non Reactive  Gamma GT  Result Value Ref Range   GGT 14 0 - 60 IU/L      Assessment & Plan:   Problem List Items Addressed This Visit       Other   Generalized anxiety disorder - Primary    Refer to depression plan of care for further.      Relevant Medications   buPROPion (WELLBUTRIN XL) 300 MG  24 hr tablet   Severe episode of recurrent major depressive disorder, without psychotic features (HCC)    Chronic, ongoing.  Denies SI/HI.  At this time will continue Zoloft 200 MG daily and maintain Xanax as ordered, has been on for >6 years and does not want to change.  Increase Wellbutrin XL to 300 MG daily, as is offering some improvement to concentration and motivation + anhedonia.  Educated her on this medication, including use + side effects.  Discussed BLACK BOX warning and recommend she immediately alert PCP or go to ER if SI presents. Recommend return to therapy in future, which could offer benefit to mood - will consider this referral next visit if mood continues to improve.  UDS due 08/05/23 and controlled substance agreement up to date.  Return in 4 weeks.       Relevant Medications   buPROPion (WELLBUTRIN XL) 300 MG 24 hr tablet     Follow up plan: Return in about 4 weeks (around 12/30/2022) for MOOD -- increased Wellbutrin 300 MG XL.

## 2022-12-02 NOTE — Assessment & Plan Note (Signed)
Refer to depression plan of care for further. 

## 2023-01-02 NOTE — Patient Instructions (Signed)
Be Involved in Your Health Care:  Taking Medications When medications are taken as directed, they can greatly improve your health. But if they are not taken as instructed, they may not work. In some cases, not taking them correctly can be harmful. To help ensure your treatment remains effective and safe, understand your medications and how to take them.  Your lab results, notes and after visit summary will be available on My Chart. We strongly encourage you to use this feature. If lab results are abnormal the clinic will contact you with the appropriate steps. If the clinic does not contact you assume the results are satisfactory. You can always see your results on My Chart. If you have questions regarding your condition, please contact the clinic during office hours. You can also ask questions on My Chart.  We at [facility] are grateful that you chose [place] to provide care. We strive to provide excellent and compassionate care and are always looking for feedback. If you get a survey from the clinic please complete this.    Depression and Anxiety You will learn about mood disorders, how these conditions can occur in people with heart disease, and why it is important to treat these conditions. To view the content, go to this web address: [email or web address]  This video will expire on: 09/02/2024. If you need access to this video following this date, please reach out to the healthcare provider who assigned it to you. This information is not intended to replace advice given to you by your health care provider. Make sure you discuss any questions you have with your health care provider. Elsevier Patient Education  2023 [employer].

## 2023-01-03 ENCOUNTER — Other Ambulatory Visit: Payer: Self-pay | Admitting: Nurse Practitioner

## 2023-01-04 ENCOUNTER — Ambulatory Visit: Admitting: Nurse Practitioner

## 2023-01-04 VITALS — BP 104/70 | HR 86 | Temp 98.8°F | Ht 63.5 in | Wt 152.8 lb

## 2023-01-04 DIAGNOSIS — F411 Generalized anxiety disorder: Secondary | ICD-10-CM

## 2023-01-04 DIAGNOSIS — F332 Major depressive disorder, recurrent severe without psychotic features: Secondary | ICD-10-CM

## 2023-01-04 MED ORDER — RIZATRIPTAN BENZOATE 5 MG PO TBDP: 5.0000 mg | ORAL_TABLET | ORAL | 3 refills | Status: DC | PRN

## 2023-01-04 MED ORDER — BUPROPION HCL ER (XL) 300 MG PO TB24: 300.0000 mg | ORAL_TABLET | Freq: Every day | ORAL | 4 refills | Status: DC

## 2023-01-04 NOTE — Assessment & Plan Note (Signed)
Refer to depression plan of care for further. 

## 2023-01-04 NOTE — Telephone Encounter (Signed)
Dose changed 12/02/22. Requested Prescriptions  Refused Prescriptions Disp Refills   buPROPion (WELLBUTRIN XL) 150 MG 24 hr tablet [Pharmacy Med Name: BUPROPION XL 150MG  TABLETS (24 H)] 30 tablet 1    Sig: TAKE 1 TABLET(150 MG) BY MOUTH DAILY     Psychiatry: Antidepressants - bupropion Passed - 01/03/2023  7:41 AM      Passed - Cr in normal range and within 360 days    Creatinine  Date Value Ref Range Status  08/04/2022 65.5 20.0 - 300.0 mg/dL Final   Creatinine, Ser  Date Value Ref Range Status  08/04/2022 0.74 0.57 - 1.00 mg/dL Final         Passed - AST in normal range and within 360 days    AST  Date Value Ref Range Status  08/04/2022 18 0 - 40 IU/L Final         Passed - ALT in normal range and within 360 days    ALT  Date Value Ref Range Status  08/04/2022 16 0 - 32 IU/L Final         Passed - Completed PHQ-2 or PHQ-9 in the last 360 days      Passed - Last BP in normal range    BP Readings from Last 1 Encounters:  01/04/23 104/70         Passed - Valid encounter within last 6 months    Recent Outpatient Visits           Today Severe episode of recurrent major depressive disorder, without psychotic features   [place] [facility] [place], Corrie Dandy T, NP   1 month ago Generalized anxiety disorder   [place] [facility] [place], [place] T, NP   2 months ago Severe episode of recurrent major depressive disorder, without psychotic features (HCC)   [place] [facility] [place], [place] T, NP   2 months ago Feeling of chest tightness   [place] [facility] Mecum, Erin E, PA-C   5 months ago Severe episode of recurrent major depressive disorder, without psychotic features (HCC)   [place] [facility] [place], Dorie Rank, NP       Future Appointments             In 7 months Cann[place], Dorie Rank, NP [place] [employer] [facility], [facility]

## 2023-01-04 NOTE — Progress Notes (Signed)
BP 104/70   Pulse 86   Temp 98.8 F (37.1 C) (Oral)   Ht 5' 3.5" (1.613 m)   Wt 152 lb 12.8 oz (69.3 kg)   SpO2 98%   BMI 26.64 kg/m    Subjective:    Patient ID: Stephanie Logan, female    DOB: [birth year], 32 y.o.   MRN: [id number]  HPI: Stephanie Logan is a 32 y.o. female  Chief Complaint  Patient presents with   Depression    Had increase of Wellbutrin at last visit, patient states she is feeling good on increase   Anxiety   DEPRESSION Follow-up today for mood, increased Wellbutrin XL to 300 MG daily on 12/02/22 = started this originally on 11/04/22.  Has had no side effects, overall reports improved mood.  Has more seasonal depression.   Continues on Zoloft 200 MG daily and Xanax as needed 0.5 MG PRN. Xanax she uses about 3 times a week, often at nigh - last fill 04/23/22.  Pt is aware of risks of benzo medication use to include increased sedation, respiratory suppression, falls, dependence and cardiovascular events.  Pt would like to continue treatment as benefit determined to outweigh risk.  Does not take Xanax during week days due to fatigue with this. Is currently taking Melatonin daily for sleep. Can not take Trazodone, makes her sleepy.    HISTORY: Has been on this regimen for > 6 years started by OB/GYN. Has struggled with anxiety/depression her whole life, this worsened after 2nd child was born.  Her husband is double amputee, lost legs in [place]. Has history of stillborn child.  A referral to psychiatry was placed on 11/06/21 for further evaluation and assessment for ADD, she never attended this. Has attended therapy in past. Mood status: stable Satisfied with current treatment?: yes Symptom severity: moderate  Duration of current treatment : chronic Side effects: no Medication compliance: good compliance Psychotherapy/counseling: yes in the past Previous psychiatric medications: Prozac and Klonopin Depressed mood: occasional Anxious mood:  occasional,  improving Anhedonia: no Significant weight loss or gain: no Insomnia: improving Fatigue:  occasional Feelings of worthlessness or guilt: no Impaired concentration/indecisiveness: no Suicidal ideations: no Hopelessness: no Crying spells: no    01/04/2023    1:44 PM 12/02/2022    1:15 PM 11/04/2022    3:21 PM 08/04/2022    4:11 PM 07/07/2022    4:34 PM  Depression screen PHQ 2/9  Decreased Interest Down, Depressed, Hopeless 0  PHQ - 2 Score Altered sleeping Tired, decreased energy Change in appetite 0 2  Feeling bad or failure about yourself  0 1  2 0  Trouble concentrating 0 0  Moving slowly or fidgety/restless 0 0 0 0 0  Suicidal thoughts 0 0 0 0 0  PHQ-9 Score Difficult doing work/chores Not difficult at all Somewhat difficult Somewhat difficult Somewhat difficult Somewhat difficult       01/04/2023    1:44 PM 12/02/2022    1:16 PM 11/04/2022    3:22 PM 08/04/2022    4:11 PM  GAD 7 : Generalized Anxiety Score  Nervous, Anxious, on Edge Control/stop worrying Worry too much - different things Trouble relaxing  Restless 0 Easily annoyed or irritable Afraid - awful might happen Total GAD 7 Score  Anxiety Difficulty Somewhat difficult Somewhat difficult Somewhat difficult Somewhat difficult   Relevant past medical, surgical, family and social history reviewed and updated as indicated. Interim medical history since our last visit reviewed. Allergies and medications reviewed and updated.  Review of Systems  Constitutional:  Negative for activity change, appetite change, diaphoresis, fatigue and fever.  Respiratory:  Negative for cough, chest tightness, shortness of breath and wheezing.   Cardiovascular:  Negative for chest pain, palpitations and leg swelling.  Gastrointestinal: Negative.   Endocrine: Negative.   Neurological:  Negative.   Psychiatric/Behavioral:  Positive for sleep disturbance. Negative for decreased concentration, self-injury and suicidal ideas. The patient is nervous/anxious.     Per HPI unless specifically indicated above     Objective:    BP 104/70   Pulse 86   Temp 98.8 F (37.1 C) (Oral)   Ht 5' 3.5" (1.613 m)   Wt 152 lb 12.8 oz (69.3 kg)   SpO2 98%   BMI 26.64 kg/m   Wt Readings from Last 3 Encounters:  01/04/23 152 lb 12.8 oz (69.3 kg)  12/02/22 151 lb 9.6 oz (68.8 kg)  11/04/22 151 lb 4.8 oz (68.6 kg)    Physical Exam Vitals and nursing note reviewed.  Constitutional:      General: She is awake. She is not in acute distress.    Appearance: She is well-developed and well-groomed. She is not ill-appearing or toxic-appearing.  HENT:     Head: Normocephalic.     Right Ear: Hearing normal.     Left Ear: Hearing normal.  Eyes:     General: Lids are normal.        Right eye: No discharge.        Left eye: No discharge.     Conjunctiva/sclera: Conjunctivae normal.     Pupils: Pupils are equal, round, and reactive to light.  Neck:     Thyroid: No thyromegaly.     Vascular: No carotid bruit.  Cardiovascular:     Rate and Rhythm: Normal rate and regular rhythm.     Heart sounds: Normal heart sounds. No murmur heard.    No gallop.  Pulmonary:     Effort: Pulmonary effort is normal. No accessory muscle usage or respiratory distress.     Breath sounds: Normal breath sounds.  Abdominal:     General: Bowel sounds are normal. There is no distension.     Palpations: Abdomen is soft. There is no hepatomegaly.     Tenderness: There is no abdominal tenderness. There is no right CVA tenderness or left CVA tenderness.  Musculoskeletal:     Cervical back: Normal range of motion and neck supple.     Right lower leg: No edema.     Left lower leg: No edema.  Lymphadenopathy:     Cervical: No cervical adenopathy.  Skin:    General: Skin is warm and dry.  Neurological:     Mental  Status: She is alert and oriented to person, place, and time.     Motor: Motor function is intact.     Coordination: Coordination is intact.     Gait: Gait is intact.     Deep Tendon Reflexes: Reflexes are normal and symmetric.     Reflex Scores:  Brachioradialis reflexes are 2+ on the right side and 2+ on the left side.      Patellar reflexes are 2+ on the right side and 2+ on the left side. Psychiatric:        Attention and Perception: Attention normal.        Mood and Affect: Mood normal.        Speech: Speech normal.        Behavior: Behavior normal. Behavior is cooperative.        Thought Content: Thought content normal.     Results for orders placed or performed in visit on 08/04/22  [id number] 11+Oxyco+Alc+Crt-Bund  Result Value Ref Range   Ethanol Negative Cutoff=0.020 %   Amphetamines, Urine Negative Cutoff=1000 ng/mL   Barbiturate Negative Cutoff=200 ng/mL   BENZODIAZ UR QL Negative Cutoff=200 ng/mL   Cannabinoid Quant, Ur Negative Cutoff=50 ng/mL   Cocaine (Metabolite) Negative Cutoff=300 ng/mL   OPIATE SCREEN URINE Negative Cutoff=300 ng/mL   Oxycodone/Oxymorphone, Urine Negative Cutoff=300 ng/mL   Phencyclidine Negative Cutoff=25 ng/mL   Methadone Screen, Urine Negative Cutoff=300 ng/mL   Propoxyphene Negative Cutoff=300 ng/mL   Meperidine Negative Cutoff=200 ng/mL   Tramadol Negative Cutoff=200 ng/mL   Creatinine 65.5 20.0 - 300.0 mg/dL   pH, Urine 6.5 4.5 - 8.9  CBC with Differential/Platelet  Result Value Ref Range   WBC 8.8 3.4 - 10.8 x10E3/uL   RBC 4.63 3.77 - 5.28 x10E6/uL   Hemoglobin 14.1 11.1 - 15.9 g/dL   Hematocrit 04.5 40.9 - 46.6 %   MCV 87 79 - 97 fL   MCH 30.5 26.6 - 33.0 pg   MCHC 35.0 31.5 - 35.7 g/dL   RDW 81.1 91.4 - 78.2 %   Platelets 308 150 - 450 x10E3/uL   Neutrophils 64 Not Estab. %   Lymphs 26 Not Estab. %   Monocytes 7 Not Estab. %   Eos 2 Not Estab. %   Basos 1 Not Estab. %   Neutrophils Absolute 5.7 1.4 - 7.0 x10E3/uL    Lymphocytes Absolute 2.2 0.7 - 3.1 x10E3/uL   Monocytes Absolute 0.6 0.1 - 0.9 x10E3/uL   EOS (ABSOLUTE) 0.2 0.0 - 0.4 x10E3/uL   Basophils Absolute 0.1 0.0 - 0.2 x10E3/uL   Immature Granulocytes 0 Not Estab. %   Immature Grans (Abs) 0.0 0.0 - 0.1 x10E3/uL  Comprehensive metabolic panel  Result Value Ref Range   Glucose 82 70 - 99 mg/dL   BUN 16 6 - 20 mg/dL   Creatinine, Ser 9.56 0.57 - 1.00 mg/dL   eGFR 213 >08 MV/HQI/6.96   BUN/Creatinine Ratio 22 9 - 23   Sodium 137 134 - 144 mmol/L   Potassium 4.0 3.5 - 5.2 mmol/L   Chloride 101 96 - 106 mmol/L   CO2 22 20 - 29 mmol/L   Calcium 9.9 8.7 - 10.2 mg/dL   Total Protein 7.6 6.0 - 8.5 g/dL   Albumin 5.0 (H) 3.9 - 4.9 g/dL   Globulin, Total 2.6 1.5 - 4.5 g/dL   Albumin/Globulin Ratio 1.9 1.2 - 2.2   Bilirubin Total 0.3 0.0 - 1.2 mg/dL   Alkaline Phosphatase 61 44 - 121 IU/L   AST 18 0 - 40 IU/L   ALT 16 0 - 32 IU/L  Lipid Panel w/o Chol/HDL Ratio  Result Value Ref Range   Cholesterol, Total 203 (H) 100 - 199 mg/dL   Triglycerides 295 0 - 149 mg/dL   HDL 57 >28 mg/dL   VLDL Cholesterol Cal 19 5 -  40 mg/dL   LDL Chol Calc (NIH) 696 (H) 0 - 99 mg/dL  TSH  Result Value Ref Range   TSH 1.730 0.450 - 4.500 uIU/mL  HgB A1c  Result Value Ref Range   Hgb A1c MFr Bld 5.2 4.8 - 5.6 %   Est. average glucose Bld gHb Est-mCnc 103 mg/dL  Vitamin E95  Result Value Ref Range   Vitamin B-12 455 232 - 1,245 pg/mL  Hepatitis C antibody  Result Value Ref Range   Hep C Virus Ab Non Reactive Non Reactive  Gamma GT  Result Value Ref Range   GGT 14 0 - 60 IU/L      Assessment & Plan:   Problem List Items Addressed This Visit       Other   Generalized anxiety disorder    Refer to depression plan of care for further.      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  Denies SI/HI.  Continue  Zoloft 200 MG daily and maintain Xanax as ordered, has been on for >6 years and does not want to change.  Continue Wellbutrin XL 300 MG daily, as is offering some improvement to concentration and motivation + anhedonia.  Educated her on this medication, including use + side effects.  Discussed BLACK BOX warning and recommend she immediately alert PCP or go to ER if SI presents. Recommend return to therapy in future, which could offer benefit to mood.  UDS due 08/05/23 and controlled substance agreement up to date.  Return in November for physical and refills.       Relevant Medications   buPROPion (WELLBUTRIN XL) 300 MG 24 hr tablet     Follow up plan: Return in about 7 months (around 08/06/2023) for Annual physical with mood assessment.

## 2023-01-04 NOTE — Assessment & Plan Note (Signed)
Chronic, ongoing.  Denies SI/HI.  Continue Zoloft 200 MG daily and maintain Xanax as ordered, has been on for >6 years and does not want to change.  Continue Wellbutrin XL 300 MG daily, as is offering some improvement to concentration and motivation + anhedonia.  Educated her on this medication, including use + side effects.  Discussed BLACK BOX warning and recommend she immediately alert PCP or go to ER if SI presents. Recommend return to therapy in future, which could offer benefit to mood.  UDS due 08/05/23 and controlled substance agreement up to date.  Return in November for physical and refills.

## 2023-01-25 ENCOUNTER — Encounter: Payer: Self-pay | Admitting: Nurse Practitioner

## 2023-05-14 ENCOUNTER — Ambulatory Visit
Admission: EM | Admit: 2023-05-14 | Discharge: 2023-05-14 | Disposition: A | Discharge status: Home / Self Care | Source: Home / Self Care

## 2023-05-14 ENCOUNTER — Ambulatory Visit: Admitting: Physician Assistant

## 2023-05-14 ENCOUNTER — Ambulatory Visit: Payer: Self-pay | Admitting: *Deleted

## 2023-05-14 DIAGNOSIS — S80869A Insect bite (nonvenomous), unspecified lower leg, initial encounter: Secondary | ICD-10-CM

## 2023-05-14 DIAGNOSIS — L0390 Cellulitis, unspecified: Secondary | ICD-10-CM | POA: Diagnosis not present

## 2023-05-14 DIAGNOSIS — W57XXXA Bitten or stung by nonvenomous insect and other nonvenomous arthropods, initial encounter: Secondary | ICD-10-CM

## 2023-05-14 MED ORDER — DOXYCYCLINE HYCLATE 100 MG PO CAPS: 100.0000 mg | ORAL_CAPSULE | Freq: Two times a day (BID) | ORAL | 0 refills | Status: AC
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
## 2023-05-14 NOTE — ED Provider Notes (Signed)
Renaldo Fiddler    CSN: [id number] Arrival date & time: 05/14/23  1106      History   Chief Complaint Chief Complaint  Patient presents with   Rash    HPI Stephanie Logan is a 32 y.o. female.  Patient presents with multiple insect bites on both lower legs x 2 weeks.  At the onset of these bites, they were itchy and she scratched them open.  They have been draining clear yellow drainage but some have started to drain purulent drainage.  She has been treating them with mupirocin ointment without improvement.  She denies fever, chills, or other symptoms.   The history is provided by the patient and medical records.    Past Medical History:  Diagnosis Date   Amenorrhea    Anxiety    Kidney stone    PCOS (polycystic ovarian syndrome)    Post partum depression     Patient Active Problem List   Diagnosis Date Noted   Gastroesophageal reflux disease without esophagitis 07/07/2022   Pelvic congestive syndrome 08/04/2021   Elevated low density lipoprotein (LDL) cholesterol level 06/24/2021   Controlled substance agreement signed 04/16/2021   Long term prescription benzodiazepine use 04/16/2021   Migraine with aura 04/16/2021   History of kidney stones 04/16/2021   History of abnormal mammogram 04/16/2021   Generalized anxiety disorder 04/12/2021   Liver hemangioma 08/01/2018   Vitamin B 12 deficiency 05/30/2018   History of IUFD 02/07/2017   Chronic pain of right knee 02/18/2016   Severe episode of recurrent major depressive disorder, without psychotic features (HCC)    PCOS (polycystic ovarian syndrome) 07/19/2015    Past Surgical History:  Procedure Laterality Date   ABDOMINAL SURGERY  10/24/2021   vessel ablation   BREAST EXCISIONAL BIOPSY Right 2018   fibroadenoma   BREAST EXCISIONAL BIOPSY Left 2010   fibroadenoma   BREAST FIBROADENOMA SURGERY  2008,2010   EXTRACORPOREAL SHOCK WAVE LITHOTRIPSY Right 05/16/2020   Procedure: EXTRACORPOREAL SHOCK WAVE  LITHOTRIPSY (ESWL);  Surgeon: Riki Altes, MD;  Location: [facility] ORS;  Service: Urology;  Laterality: Right;    OB History     Gravida  5   Para  2   Term  1   Preterm  1   AB  1   Living  3      SAB  1   IAB  0   Ectopic  0   Multiple  0   Live Births  3            Home Medications    Prior to Admission medications   Medication Sig Start Date End Date Taking? Authorizing Provider  ALPRAZolam Prudy Feeler) 0.5 MG tablet Take 1 tablet (0.5 mg total) by mouth 2 (two) times daily as needed for anxiety. 11/04/22  Yes Cannady, Jolene T, NP  buPROPion (WELLBUTRIN XL) 300 MG 24 hr tablet Take 1 tablet (300 mg total) by mouth daily. 01/04/23  Yes Cannady, Jolene T, NP  doxycycline (VIBRAMYCIN) 100 MG capsule Take 1 capsule (100 mg total) by mouth 2 (two) times daily for 7 days. 05/14/23 05/21/23 Yes Mickie Bail, NP  sertraline (ZOLOFT) 100 MG tablet Take 2 tablets (200 mg total) by mouth daily. 08/04/22  Yes Cannady, Jolene T, NP  rizatriptan (MAXALT-MLT) 5 MG disintegrating tablet Take 1 tablet (5 mg total) by mouth as needed for migraine. May repeat in 2 hours if needed 01/04/23   Marjie Skiff, NP    Family History  Family History  Problem Relation Age of Onset   Cancer Mother        cervical, precancerous   Cancer Father        Colon, Precancerous   Seizures Sister    Lung cancer Sister    Heart Problems Sister    Hashimoto's thyroiditis Brother    Learning disabilities Son    Cancer Maternal Grandmother        brain   Healthy Maternal Grandfather    Healthy Paternal Grandmother    Aneurysm Paternal Grandfather        Brain   Breast cancer Neg Hx     Social History Social History   Tobacco Use   Smoking status: Never    Passive exposure: Never   Smokeless tobacco: Never  Vaping Use   Vaping status: Never Used  Substance Use Topics   Alcohol use: Yes    Comment: occas   Drug use: No     Allergies   Mucinex dm [dm-guaifenesin er] and  Ciprofloxacin   Review of Systems Review of Systems  Constitutional:  Negative for chills and fever.  Musculoskeletal:  Negative for arthralgias, gait problem and joint swelling.  Skin:  Positive for color change and wound.  Neurological:  Negative for weakness and numbness.     Physical Exam Triage Vital Signs ED Triage Vitals  Encounter Vitals Group     BP      Systolic BP Percentile      Diastolic BP Percentile      Pulse      Resp      Temp      Temp src      SpO2      Weight      Height      Head Circumference      Peak Flow      Pain Score      Pain Loc      Pain Education      Exclude from Growth Chart    No data found.  Updated Vital Signs BP 104/70 (BP Location: Left Arm)   Pulse 73   Temp 98.7 F (37.1 C)   Resp 18   LMP 05/12/2023 (Exact Date)   SpO2 98%   Visual Acuity Right Eye Distance:   Left Eye Distance:   Bilateral Distance:    Right Eye Near:   Left Eye Near:    Bilateral Near:     Physical Exam Constitutional:      General: She is not in acute distress. HENT:     Mouth/Throat:     Mouth: Mucous membranes are moist.  Cardiovascular:     Rate and Rhythm: Normal rate and regular rhythm.  Pulmonary:     Effort: Pulmonary effort is normal. No respiratory distress.  Musculoskeletal:        General: No swelling or deformity. Normal range of motion.  Skin:    General: Skin is warm and dry.     Capillary Refill: Capillary refill takes less than 2 seconds.     Findings: Lesion present.     Comments: Multiple scattered open lesions on both LE.  Some lesions with localized erythema.   Neurological:     Mental Status: She is alert.     Sensory: No sensory deficit.     Motor: No weakness.     Gait: Gait normal.  Psychiatric:        Mood and Affect: Mood normal.  Behavior: Behavior normal.      UC Treatments / Results  Labs (all labs ordered are listed, but only abnormal results are displayed) Labs Reviewed - No data to  display  EKG   Radiology No results found.  Procedures Procedures (including critical care time)  Medications Ordered in UC Medications - No data to display  Initial Impression / Assessment and Plan / UC Course  I have reviewed the triage vital signs and the nursing notes.  Pertinent labs & imaging results that were available during my care of the patient were reviewed by me and considered in my medical decision making (see chart for details).    Mild cellulitis surrounding multiple insect bites of both lower legs.  Afebrile and vital signs are stable.  Patient has numerous open insect bites on both lower legs.  Some of the bites have localized erythema.  Patient reports some purulent drainage from a couple of the bites.  Treating with doxycycline.  Instructed her to continue using the mupirocin ointment twice a day.  Wound care instructions discussed.  Education provided on cellulitis and insect bites.  Instructed her to follow-up with her PCP if her symptoms are not improving.  She agrees to plan of care.  Final Clinical Impressions(s) / UC Diagnoses   Final diagnoses:  Cellulitis of skin  Insect bite of lower leg, unspecified laterality, initial encounter     Discharge Instructions      Take the doxycycline as directed.  Continue to use the mupirocin ointment twice a day as directed.  Follow up with your primary care provider if your symptoms are not improving.        ED Prescriptions     Medication Sig Dispense Auth. Provider   doxycycline (VIBRAMYCIN) 100 MG capsule Take 1 capsule (100 mg total) by mouth 2 (two) times daily for 7 days. 14 capsule Mickie Bail, NP      PDMP not reviewed this encounter.   Mickie Bail, NP 05/14/23 1139

## 2023-05-14 NOTE — ED Triage Notes (Signed)
Pt presents with red sores/ rash on front of both lower legs. Pt states she noticed them 2 weeks ago and they were itchy at first but now are just sore and oozing clear and yellow liquid, putting bactrim ointment and cleaning them daily with no relief of symptoms.

## 2023-05-14 NOTE — Discharge Instructions (Addendum)
Take the doxycycline as directed.  Continue to use the mupirocin ointment twice a day as directed.  Follow up with your primary care provider if your symptoms are not improving.

## 2023-05-14 NOTE — Telephone Encounter (Signed)
  Chief Complaint: insect bites- not healing, sores Symptoms: insect bites- not healing- sores on legs- knee and shins Frequency: 2 weeks ago- still present Pertinent Negatives: Patient denies fever- other symptoms Disposition: [] ED /[] Urgent Care (no appt availability in office) / [x] Appointment(In office/virtual)/ []  [facility]/ [] Home Care/ [] Refused Recommended Disposition /[] [facility]/ []  Follow-up with PCP Additional Notes: Patient reports she  and her children got insect bites 2-3 weeks ago and hers are not getting better- some are sores and inflamed. Patient has been treating them- claening and using antibiotic ointment- but some areas are not responding

## 2023-06-09 ENCOUNTER — Telehealth: Admitting: Physician Assistant

## 2023-06-09 ENCOUNTER — Encounter

## 2023-06-09 DIAGNOSIS — B9689 Other specified bacterial agents as the cause of diseases classified elsewhere: Secondary | ICD-10-CM | POA: Diagnosis not present

## 2023-06-09 DIAGNOSIS — J019 Acute sinusitis, unspecified: Secondary | ICD-10-CM

## 2023-06-09 MED ORDER — AMOXICILLIN-POT CLAVULANATE 875-125 MG PO TABS
1.0000 | ORAL_TABLET | Freq: Two times a day (BID) | ORAL | 0 refills | Status: AC
Start: 2023-06-09 — End: ?

## 2023-06-09 NOTE — Progress Notes (Signed)
I have spent 5 minutes in review of e-visit questionnaire, review and updating patient chart, medical decision making and response to patient.   Piedad Climes, PA-C

## 2023-06-09 NOTE — Progress Notes (Signed)

## 2023-06-20 NOTE — Patient Instructions (Signed)

## 2023-06-21 ENCOUNTER — Ambulatory Visit: Admitting: Nurse Practitioner

## 2023-06-21 ENCOUNTER — Encounter: Payer: Self-pay | Admitting: Nurse Practitioner

## 2023-06-21 VITALS — BP 94/61 | HR 79 | Ht 63.5 in | Wt 149.0 lb

## 2023-06-21 DIAGNOSIS — R052 Subacute cough: Secondary | ICD-10-CM

## 2023-06-21 MED ORDER — PREDNISONE 20 MG PO TABS: 40.0000 mg | ORAL_TABLET | Freq: Every day | ORAL | 0 refills | Status: AC

## 2023-06-21 MED ORDER — AZITHROMYCIN 250 MG PO TABS: ORAL_TABLET | ORAL | 0 refills | Status: AC

## 2023-06-21 MED ORDER — ALBUTEROL SULFATE HFA 108 (90 BASE) MCG/ACT IN AERS: 2.0000 | INHALATION_SPRAY | Freq: Four times a day (QID) | RESPIRATORY_TRACT | 0 refills | Status: DC | PRN

## 2023-06-21 NOTE — Assessment & Plan Note (Signed)
Ongoing for over 3 weeks since having Covid, no benefit to cough with Augmentin, this helped sinuses only.  Will start Prednisone 40 MG daily for 5 days + Zpack.  Albuterol inhaler to use as needed.  Educated her on this plan of care.  Recommend: - Increased rest - Increasing Fluids - Acetaminophen / ibuprofen as needed for fever/pain.  - Salt water gargling, chloraseptic spray and throat lozenges - Mucinex.  - Humidifying the air.  Return in one week, if ongoing will obtain CXR.

## 2023-06-21 NOTE — Progress Notes (Signed)
BP 94/61   Pulse 79   Ht 5' 3.5" (1.613 m)   Wt 149 lb (67.6 kg)   SpO2 97%   BMI 25.98 kg/m    Subjective:    Patient ID: Stephanie Logan, female    DOB: 07/21/1991, 32 y.o.   MRN: [id number]  HPI: Stephanie Logan is a 32 y.o. female  Chief Complaint  Patient presents with   Cough    Patient says she tested positive for COVID at the beginning of the month, and she now has a lingering cough that won't go away. Patient says she has tried over the counter Sudafed and Mucinex. Patient says she does not have a productive cough. Patient says she was given Amoxicillin for a Sinus Infection after she was diagnosed with COVID. Patient says she is coughing all day, but it gets worse at night.    POST COVID COUGH She tested positive for Covid at beginning of month and now continues to have a lingering cough.  Has tried Sudafed, Mucinex, Augmentin with no improvement.  Was given Augmentin at e-visit on 06/09/23.  This cleared sinuses only.  No history of asthma. Fever: no Cough: yes Shortness of breath: in middle of coughing fit Wheezing: yes Chest pain: no Chest tightness: yes Chest congestion: yes Nasal congestion: no Runny nose: no Post nasal drip: no Sneezing: no Sore throat: no Swollen glands: no Sinus pressure: no Headache: yes Face pain: no Toothache: no Ear pain: no Ear pressure: no Eyes red/itching:no Eye drainage/crusting: no  Vomiting: no Rash: no Fatigue: yes Sick contacts: no Strep contacts: no  Context: fluctuating Recurrent sinusitis: no Relief with OTC cold/cough medications: no  Treatments attempted: Sudafed, Mucinex, Augmentin    Relevant past medical, surgical, family and social history reviewed and updated as indicated. Interim medical history since our last visit reviewed. Allergies and medications reviewed and updated.  Review of Systems  Constitutional:  Positive for fatigue. Negative for activity change, appetite change and fever.  HENT:  Negative.    Eyes:  Negative for pain and visual disturbance.  Respiratory:  Positive for cough, chest tightness, shortness of breath (with coughin only) and wheezing.   Cardiovascular:  Negative for chest pain, palpitations and leg swelling.  Gastrointestinal:  Negative for abdominal distention, abdominal pain, constipation, diarrhea, nausea and vomiting.  Endocrine: Negative.   Neurological:  Negative for dizziness, numbness and headaches.  Psychiatric/Behavioral: Negative.      Per HPI unless specifically indicated above     Objective:    BP 94/61   Pulse 79   Ht 5' 3.5" (1.613 m)   Wt 149 lb (67.6 kg)   SpO2 97%   BMI 25.98 kg/m   Wt Readings from Last 3 Encounters:  06/21/23 149 lb (67.6 kg)  01/04/23 152 lb 12.8 oz (69.3 kg)  12/02/22 151 lb 9.6 oz (68.8 kg)    Physical Exam Vitals and nursing note reviewed.  Constitutional:      General: She is awake. She is not in acute distress.    Appearance: She is well-developed and well-groomed. She is not ill-appearing or toxic-appearing.  HENT:     Head: Normocephalic.     Right Ear: Hearing, ear canal and external ear normal. A middle ear effusion is present. Tympanic membrane is not injected or perforated.     Left Ear: Hearing, ear canal and external ear normal. A middle ear effusion is present. Tympanic membrane is not injected or perforated.     Nose: No  rhinorrhea.     Right Sinus: No maxillary sinus tenderness or frontal sinus tenderness.     Left Sinus: No maxillary sinus tenderness or frontal sinus tenderness.     Mouth/Throat:     Mouth: Mucous membranes are moist.     Pharynx: Posterior oropharyngeal erythema (mild) present. No pharyngeal swelling or oropharyngeal exudate.  Eyes:     General: Lids are normal.        Right eye: No discharge.        Left eye: No discharge.     Conjunctiva/sclera: Conjunctivae normal.     Pupils: Pupils are equal, round, and reactive to light.  Neck:     Thyroid: No thyromegaly.      Vascular: No carotid bruit.  Cardiovascular:     Rate and Rhythm: Normal rate and regular rhythm.     Heart sounds: Normal heart sounds. No murmur heard.    No gallop.  Pulmonary:     Effort: Pulmonary effort is normal. No accessory muscle usage or respiratory distress.     Breath sounds: Wheezing present. No decreased breath sounds or rhonchi.     Comments: Expiratory wheezes noted throughout, no rhonchi. Abdominal:     General: Bowel sounds are normal.     Palpations: Abdomen is soft. There is no hepatomegaly or splenomegaly.  Musculoskeletal:     Cervical back: Normal range of motion and neck supple.     Right lower leg: No edema.     Left lower leg: No edema.  Lymphadenopathy:     Head:     Right side of head: No submental, submandibular, tonsillar, preauricular or posterior auricular adenopathy.     Left side of head: No submental, submandibular, tonsillar, preauricular or posterior auricular adenopathy.     Cervical: No cervical adenopathy.  Skin:    General: Skin is warm and dry.  Neurological:     Mental Status: She is alert and oriented to person, place, and time.  Psychiatric:        Attention and Perception: Attention normal.        Mood and Affect: Mood normal.        Speech: Speech normal.        Behavior: Behavior normal. Behavior is cooperative.        Thought Content: Thought content normal.    Results for orders placed or performed in visit on 08/04/22  [id number]+Oxyco+Alc+Crt-Bund  Result Value Ref Range   Ethanol Negative Cutoff=0.020 %   Amphetamines, Urine Negative Cutoff=1000 ng/mL   Barbiturate Negative Cutoff=200 ng/mL   BENZODIAZ UR QL Negative Cutoff=200 ng/mL   Cannabinoid Quant, Ur Negative Cutoff=50 ng/mL   Cocaine (Metabolite) Negative Cutoff=300 ng/mL   OPIATE SCREEN URINE Negative Cutoff=300 ng/mL   Oxycodone/Oxymorphone, Urine Negative Cutoff=300 ng/mL   Phencyclidine Negative Cutoff=25 ng/mL   Methadone Screen, Urine Negative  Cutoff=300 ng/mL   Propoxyphene Negative Cutoff=300 ng/mL   Meperidine Negative Cutoff=200 ng/mL   Tramadol Negative Cutoff=200 ng/mL   Creatinine 65.5 20.0 - 300.0 mg/dL   pH, Urine 6.5 4.5 - 8.9  CBC with Differential/Platelet  Result Value Ref Range   WBC 8.8 3.4 - 10.8 x10E3/uL   RBC 4.63 3.77 - 5.28 x10E6/uL   Hemoglobin 14.1 11.1 - 15.9 g/dL   Hematocrit 84.6 96.2 - 46.6 %   MCV 87 79 - 97 fL   MCH 30.5 26.6 - 33.0 pg   MCHC 35.0 31.5 - 35.7 g/dL   RDW 95.2 84.1 - 32.4 %  Platelets 308 150 - 450 x10E3/uL   Neutrophils 64 Not Estab. %   Lymphs 26 Not Estab. %   Monocytes 7 Not Estab. %   Eos 2 Not Estab. %   Basos 1 Not Estab. %   Neutrophils Absolute 5.7 1.4 - 7.0 x10E3/uL   Lymphocytes Absolute 2.2 0.7 - 3.1 x10E3/uL   Monocytes Absolute 0.6 0.1 - 0.9 x10E3/uL   EOS (ABSOLUTE) 0.2 0.0 - 0.4 x10E3/uL   Basophils Absolute 0.1 0.0 - 0.2 x10E3/uL   Immature Granulocytes 0 Not Estab. %   Immature Grans (Abs) 0.0 0.0 - 0.1 x10E3/uL  Comprehensive metabolic panel  Result Value Ref Range   Glucose 82 70 - 99 mg/dL   BUN 16 6 - 20 mg/dL   Creatinine, Ser 6.43 0.57 - 1.00 mg/dL   eGFR 329 >51 OA/CZY/6.06   BUN/Creatinine Ratio 22 9 - 23   Sodium 137 134 - 144 mmol/L   Potassium 4.0 3.5 - 5.2 mmol/L   Chloride 101 96 - 106 mmol/L   CO2 22 20 - 29 mmol/L   Calcium 9.9 8.7 - 10.2 mg/dL   Total Protein 7.6 6.0 - 8.5 g/dL   Albumin 5.0 (H) 3.9 - 4.9 g/dL   Globulin, Total 2.6 1.5 - 4.5 g/dL   Albumin/Globulin Ratio 1.9 1.2 - 2.2   Bilirubin Total 0.3 0.0 - 1.2 mg/dL   Alkaline Phosphatase 61 44 - 121 IU/L   AST 18 0 - 40 IU/L   ALT 16 0 - 32 IU/L  Lipid Panel w/o Chol/HDL Ratio  Result Value Ref Range   Cholesterol, Total 203 (H) 100 - 199 mg/dL   Triglycerides 301 0 - 149 mg/dL   HDL 57 >60 mg/dL   VLDL Cholesterol Cal 19 5 - 40 mg/dL   LDL Chol Calc (NIH) 109 (H) 0 - 99 mg/dL  TSH  Result Value Ref Range   TSH 1.730 0.450 - 4.500 uIU/mL  HgB A1c  Result Value  Ref Range   Hgb A1c MFr Bld 5.2 4.8 - 5.6 %   Est. average glucose Bld gHb Est-mCnc 103 mg/dL  Vitamin N23  Result Value Ref Range   Vitamin B-12 455 232 - 1,245 pg/mL  Hepatitis C antibody  Result Value Ref Range   Hep C Virus Ab Non Reactive Non Reactive  Gamma GT  Result Value Ref Range   GGT 14 0 - 60 IU/L      Assessment & Plan:   Problem List Items Addressed This Visit       Other   Subacute cough - Primary    Ongoing for over 3 weeks since having Covid, no benefit to cough with Augmentin, this helped sinuses only.  Will start Prednisone 40 MG daily for 5 days + Zpack.  Albuterol inhaler to use as needed.  Educated her on this plan of care.  Recommend: - Increased rest - Increasing Fluids - Acetaminophen / ibuprofen as needed for fever/pain.  - Salt water gargling, chloraseptic spray and throat lozenges - Mucinex.  - Humidifying the air.  Return in one week, if ongoing will obtain CXR.        Follow up plan: Return in about 1 week (around 06/28/2023) for Cough.

## 2023-06-28 ENCOUNTER — Telehealth: Admitting: Nurse Practitioner

## 2023-07-13 ENCOUNTER — Other Ambulatory Visit: Payer: Self-pay | Admitting: Nurse Practitioner

## 2023-07-14 NOTE — Telephone Encounter (Signed)
Requested Prescriptions  Pending Prescriptions Disp Refills   albuterol (VENTOLIN HFA) 108 (90 Base) MCG/ACT inhaler [Pharmacy Med Name: ALBUTEROL HFA INH (200 PUFFS) 18GM] 18 g 0    Sig: INHALE 2 PUFFS INTO THE LUNGS EVERY 6 HOURS AS NEEDED     Pulmonology:  [facility] 2 Passed - 07/13/2023  3:38 AM      Passed - Last BP in normal range    BP Readings from Last 1 Encounters:  06/21/23 94/61         Passed - Last Heart Rate in normal range    Pulse Readings from Last 1 Encounters:  06/21/23 79         Passed - Valid encounter within last 12 months    Recent Outpatient Visits           3 weeks ago Subacute cough   [place] [facility] [place], [place] T, NP   6 months ago Severe episode of recurrent major depressive disorder, without psychotic features (HCC)   [place] [facility] [place], [place] T, NP   7 months ago Generalized anxiety disorder   [place] [facility] [place], [place] T, NP   8 months ago Severe episode of recurrent major depressive disorder, without psychotic features (HCC)   [place] [facility] [place], Jolene T, NP   9 months ago Feeling of chest tightness    [facility] [place], Oswaldo Conroy, PA-C       Future Appointments             In 3 weeks Cannady, Dorie Rank, NP  [employer], [facility]

## 2023-08-01 NOTE — Patient Instructions (Signed)
Be Involved in Caring For Your Health:  Taking Medications When medications are taken as directed, they can greatly improve your health. But if they are not taken as prescribed, they may not work. In some cases, not taking them correctly can be harmful. To help ensure your treatment remains effective and safe, understand your medications and how to take them. Bring your medications to each visit for review by your provider.  Your lab results, notes, and after visit summary will be available on My Chart. We strongly encourage you to use this feature. If lab results are abnormal the clinic will contact you with the appropriate steps. If the clinic does not contact you assume the results are satisfactory. You can always view your results on My Chart. If you have questions regarding your health or results, please contact the clinic during office hours. You can also ask questions on My Chart.  We at [facility] are grateful that you chose [place] to provide your care. We strive to provide evidence-based and compassionate care and are always looking for feedback. If you get a survey from the clinic please complete this so we can hear your opinions.  Healthy Eating, Adult Healthy eating may help you get and keep a healthy body weight, reduce the risk of chronic disease, and live a long and productive life. It is important to follow a healthy eating pattern. Your nutritional and calorie needs should be met mainly by different nutrient-rich foods. What are tips for following this plan? Reading food labels Read labels and choose the following: Reduced or low sodium products. Juices with 100% fruit juice. Foods with low saturated fats (<3 g per serving) and high polyunsaturated and monounsaturated fats. Foods with whole grains, such as whole wheat, cracked wheat, brown rice, and wild rice. Whole grains that are fortified with folic acid. This is recommended for females who are pregnant or who want to  become pregnant. Read labels and do not eat or drink the following: Foods or drinks with added sugars. These include foods that contain brown sugar, corn sweetener, corn syrup, dextrose, fructose, glucose, high-fructose corn syrup, honey, invert sugar, lactose, malt syrup, maltose, molasses, raw sugar, sucrose, trehalose, or turbinado sugar. Limit your intake of added sugars to less than 10% of your total daily calories. Do not eat more than the following amounts of added sugar per day: 6 teaspoons (25 g) for females. 9 teaspoons (38 g) for males. Foods that contain processed or refined starches and grains. Refined grain products, such as white flour, degermed cornmeal, white bread, and white rice. Shopping Choose nutrient-rich snacks, such as vegetables, whole fruits, and nuts. Avoid high-calorie and high-sugar snacks, such as potato chips, fruit snacks, and candy. Use oil-based dressings and spreads on foods instead of solid fats such as butter, margarine, sour cream, or cream cheese. Limit pre-made sauces, mixes, and "instant" products such as flavored rice, instant noodles, and ready-made pasta. Try more plant-protein sources, such as tofu, tempeh, black beans, edamame, lentils, nuts, and seeds. Explore eating plans such as the Mediterranean diet or vegetarian diet. Try heart-healthy dips made with beans and healthy fats like hummus and guacamole. Vegetables go great with these. Cooking Use oil to saut or stir-fry foods instead of solid fats such as butter, margarine, or lard. Try baking, boiling, grilling, or broiling instead of frying. Remove the fatty part of meats before cooking. Steam vegetables in water or broth. Meal planning  At meals, imagine dividing your plate into fourths: One-half of  your plate is fruits and vegetables. One-fourth of your plate is whole grains. One-fourth of your plate is protein, especially lean meats, poultry, eggs, tofu, beans, or nuts. Include low-fat  dairy as part of your daily diet. Lifestyle Choose healthy options in all settings, including home, work, school, restaurants, or stores. Prepare your food safely: Wash your hands after handling raw meats. Where you prepare food, keep surfaces clean by regularly washing with hot, soapy water. Keep raw meats separate from ready-to-eat foods, such as fruits and vegetables. Cook seafood, meat, poultry, and eggs to the recommended temperature. Get a food thermometer. Store foods at safe temperatures. In general: Keep cold foods at 48F (4.4C) or below. Keep hot foods at 148F (60C) or above. Keep your freezer at [facility] (-17.8C) or below. Foods are not safe to eat if they have been between the temperatures of 40-148F (4.4-60C) for more than 2 hours. What foods should I eat? Fruits Aim to eat 1-2 cups of fresh, canned (in natural juice), or frozen fruits each day. One cup of fruit equals 1 small apple, 1 large banana, 8 large strawberries, 1 cup (237 g) canned fruit,  cup (82 g) dried fruit, or 1 cup (240 mL) 100% juice. Vegetables Aim to eat 2-4 cups of fresh and frozen vegetables each day, including different varieties and colors. One cup of vegetables equals 1 cup (91 g) broccoli or cauliflower florets, 2 medium carrots, 2 cups (150 g) raw, leafy greens, 1 large tomato, 1 large bell pepper, 1 large sweet potato, or 1 medium white potato. Grains Aim to eat 5-10 ounce-equivalents of whole grains each day. Examples of 1 ounce-equivalent of grains include 1 slice of bread, 1 cup (40 g) ready-to-eat cereal, 3 cups (24 g) popcorn, or  cup (93 g) cooked rice. Meats and other proteins Try to eat 5-7 ounce-equivalents of protein each day. Examples of 1 ounce-equivalent of protein include 1 egg,  oz nuts (12 almonds, 24 pistachios, or 7 walnut halves), 1/4 cup (90 g) cooked beans, 6 tablespoons (90 g) hummus or 1 tablespoon (16 g) peanut butter. A cut of meat or fish that is the size of a deck of  cards is about 3-4 ounce-equivalents (85 g). Of the protein you eat each week, try to have at least 8 sounce (227 g) of seafood. This is about 2 servings per week. This includes salmon, trout, herring, sardines, and anchovies. Dairy Aim to eat 3 cup-equivalents of fat-free or low-fat dairy each day. Examples of 1 cup-equivalent of dairy include 1 cup (240 mL) milk, 8 ounces (250 g) yogurt, 1 ounces (44 g) natural cheese, or 1 cup (240 mL) fortified soy milk. Fats and oils Aim for about 5 teaspoons (21 g) of fats and oils per day. Choose monounsaturated fats, such as canola and olive oils, mayonnaise made with olive oil or avocado oil, avocados, peanut butter, and most nuts, or polyunsaturated fats, such as sunflower, corn, and soybean oils, walnuts, pine nuts, sesame seeds, sunflower seeds, and flaxseed. Beverages Aim for 6 eight-ounce glasses of water per day. Limit coffee to 3-5 eight-ounce cups per day. Limit caffeinated beverages that have added calories, such as soda and energy drinks. If you drink alcohol: Limit how much you have to: 0-1 drink a day if you are female. 0-2 drinks a day if you are female. Know how much alcohol is in your drink. In the [place] one drink is one 12 oz bottle of beer (355 mL), one 5 oz glass of wine (  148 mL), or one 1 oz glass of hard liquor (44 mL). Seasoning and other foods Try not to add too much salt to your food. Try using herbs and spices instead of salt. Try not to add sugar to food. This information is based on [place] nutrition guidelines. To learn more, visit DisposableN[employer].be. Exact amounts may vary. You may need different amounts. This information is not intended to replace advice given to you by your health care provider. Make sure you discuss any questions you have with your health care provider. Document Revised: 06/08/2022 Document Reviewed: 06/08/2022 Elsevier Patient Education  2024 [employer].

## 2023-08-06 ENCOUNTER — Ambulatory Visit (INDEPENDENT_AMBULATORY_CARE_PROVIDER_SITE_OTHER): Admitting: Nurse Practitioner

## 2023-08-06 ENCOUNTER — Encounter: Payer: Self-pay | Admitting: Nurse Practitioner

## 2023-08-06 VITALS — BP 101/68 | HR 74 | Temp 98.2°F | Ht 63.0 in | Wt 149.0 lb

## 2023-08-06 DIAGNOSIS — Z2821 Immunization not carried out because of patient refusal: Secondary | ICD-10-CM | POA: Diagnosis not present

## 2023-08-06 DIAGNOSIS — E538 Deficiency of other specified B group vitamins: Secondary | ICD-10-CM

## 2023-08-06 DIAGNOSIS — F332 Major depressive disorder, recurrent severe without psychotic features: Secondary | ICD-10-CM

## 2023-08-06 DIAGNOSIS — G43109 Migraine with aura, not intractable, without status migrainosus: Secondary | ICD-10-CM

## 2023-08-06 DIAGNOSIS — E7800 Pure hypercholesterolemia, unspecified: Secondary | ICD-10-CM

## 2023-08-06 DIAGNOSIS — F411 Generalized anxiety disorder: Secondary | ICD-10-CM

## 2023-08-06 DIAGNOSIS — Z23 Encounter for immunization: Secondary | ICD-10-CM

## 2023-08-06 DIAGNOSIS — Z0000 Encounter for general adult medical examination without abnormal findings: Secondary | ICD-10-CM

## 2023-08-06 DIAGNOSIS — Z79899 Other long term (current) drug therapy: Secondary | ICD-10-CM

## 2023-08-06 MED ORDER — ALPRAZOLAM 0.5 MG PO TABS: 0.5000 mg | ORAL_TABLET | Freq: Two times a day (BID) | ORAL | 2 refills | Status: DC | PRN

## 2023-08-06 MED ORDER — SERTRALINE HCL 100 MG PO TABS: 200.0000 mg | ORAL_TABLET | Freq: Every day | ORAL | 4 refills | Status: DC

## 2023-08-06 NOTE — Assessment & Plan Note (Signed)
Chronic, some worsening over past months.  At this time continue current OTC medications at home and adjust as needed and triptan as needed.  Referral to neurology to look into Botox which helped her a lot in past.

## 2023-08-06 NOTE — Assessment & Plan Note (Signed)
Stable.  Noted on past labs, recheck today and continue diet focus.

## 2023-08-06 NOTE — Assessment & Plan Note (Signed)
Educated at length on risks of long term use -- refer to anxiety plan.  UDS due next 08/05/24 and controlled subs contract up to date July 2022.

## 2023-08-06 NOTE — Assessment & Plan Note (Signed)
Refer to depression plan of care for further. 

## 2023-08-06 NOTE — Assessment & Plan Note (Signed)
Chronic, ongoing.  Denies SI/HI.  Continue Zoloft 200 MG daily and maintain Xanax as ordered, has been on for >6 years and does not want to change.  Continue Wellbutrin XL 300 MG daily, as is offering some improvement to concentration and motivation + anhedonia.  Educated her on this medication, including use + side effects.  Discussed BLACK BOX warning and recommend she immediately alert PCP or go to ER if SI presents. Recommend return to therapy in future, which could offer benefit to mood.  UDS due 08/05/24 and controlled substance agreement up to date.  Refills sent.

## 2023-08-06 NOTE — Progress Notes (Signed)
BP 101/68 (BP Location: Left Arm, Patient Position: Sitting, Cuff Size: Normal)   Pulse 74   Temp 98.2 F (36.8 C) (Oral)   Ht 5\' 3"  (1.6 m)   Wt 149 lb (67.6 kg)   SpO2 98%   BMI 26.39 kg/m    Subjective:    Patient ID: Stephanie Logan, female    DOB: March 16, 1991, 32 y.o.   MRN: [id number]  HPI: Stephanie Logan is a 32 y.o. female presenting on 08/06/2023 for comprehensive medical examination. Current medical complaints include:none  She currently lives with: husband Menopausal Symptoms: no  MIGRAINES Maxalt offers benefit.  Had a bad one on Sunday.  Is getting them more frequently.  Benefited from Botox in past, but has not had in awhile.  Currently 2-3 a month.  Prior to this only had once a month. Duration: years Onset: gradual Severity: 6-7/10 -- 9/10 on Sunday Quality: dull, aching, stabbing, and throbbing Frequency: intermittent Location: to right side behind eye Headache duration: 24 to 48 hours Radiation: no Time of day headache occurs: varies Alleviating factors: ice and heat on neck Aggravating factors: lack of sleep Headache status at time of visit: asymptomatic Treatments attempted: ice, heat, APAP, and ibuprofen   Aura: yes Nausea:  yes Vomiting: no Photophobia:  yes Phonophobia:  yes Effect on social functioning:  yes Numbers of missed days of school/work each month: missed one day Confusion:  no Gait disturbance/ataxia:  no Behavioral changes:  no Fevers:  no   ANXIETY/STRESS Currently taking Zoloft 200 MG daily, Wellbutrin XL 300 MG, and Xanax as needed 0.5 MG PRN. Xanax she uses about 1-3 times a week.  Has been on this regimen for 6 + years started by OB/GYN.  Has struggled with anxiety/depression her whole life, this worsened after 2nd child was born. Husband is double amputee, lost legs in [place].  Has history of stillborn child. Pt is aware of risks of benzo medication use to include increased sedation, respiratory suppression, falls,  dependence and cardiovascular events.  Pt would like to continue treatment as benefit determined to outweigh risk.  A little more anxiety with the state of the world and home schooling.   Has attended therapy in past, did find helpful. Duration:stable Anxious mood: occasional Excessive worrying: yes Irritability: yes Sweating: no Nausea:  only from migraine recently Palpitations: no Hyperventilation: occasional with anxiety Panic attacks: mild when having migraine recently Agoraphobia: no  Obscessions/compulsions: yes  Depressed mood: occasional    08/06/2023    1:11 PM 01/04/2023    1:44 PM 12/02/2022    1:15 PM 11/04/2022    3:21 PM 08/04/2022    4:11 PM  Depression screen PHQ 2/9  Decreased Interest 1 1 1 1 1   Down, Depressed, Hopeless 1 1 1 1 1   PHQ - 2 Score 2 2 2 2 2   Altered sleeping 2 1 3 2 2   Tired, decreased energy 1 1 1 2 2   Change in appetite 1 1 2 2  0  Feeling bad or failure about yourself  2 0 1  2  Trouble concentrating 1 0 2 2 3   Moving slowly or fidgety/restless 0 0 0 0 0  Suicidal thoughts 0 0 0 0 0  PHQ-9 Score 9 5 11 10 11   Difficult doing work/chores  Not difficult at all Somewhat difficult Somewhat difficult Somewhat difficult  Anhedonia: no Weight changes: no Insomnia: yes hard to stay asleep Hypersomnia: no Fatigue/loss of energy: occasional Feelings of worthlessness: no Feelings of  guilt: no Impaired concentration/indecisiveness: yes Suicidal ideations: no  Crying spells: yes Recent Stressors/Life Changes: yes   Relationship problems: no   Family stress: yes     Financial stress: no    Job stress: none   Recent death/loss: no     Sep 03, 2023    1:10 PM 01/04/2023    1:44 PM 12/02/2022    1:16 PM 11/04/2022    3:22 PM  GAD 7 : Generalized Anxiety Score  Nervous, Anxious, on Edge 1 1 1 2   Control/stop worrying 2 1 1 2   Worry too much - different things 2 1 1 2   Trouble relaxing 2  1 2   Restless 2 0 1 2  Easily annoyed or irritable 2 1 2 2    Afraid - awful might happen 2 2 3 3   Total GAD 7 Score 13  10 15   Anxiety Difficulty  Somewhat difficult Somewhat difficult Somewhat difficult       02/21/2022    2:58 PM 07/07/2022    4:33 PM 08/04/2022    4:29 PM 01/04/2023    1:44 PM Sep 03, 2023    1:23 PM  Fall Risk  Falls in the past year?  0 0 0 0  Was there an injury with Fall?  0 0 0 0  Fall Risk Category Calculator  0 0 0 0  Fall Risk Category (Retired)  Low Low    (RETIRED) Patient Fall Risk Level Low fall risk Low fall risk Low fall risk    Patient at Risk for Falls Due to  No Fall Risks No Fall Risks No Fall Risks No Fall Risks  Fall risk Follow up  Falls evaluation completed Falls prevention discussed Falls evaluation completed Falls prevention discussed    Functional Status Survey: Is the patient deaf or have difficulty hearing?: No Does the patient have difficulty seeing, even when wearing glasses/contacts?: No Does the patient have difficulty concentrating, remembering, or making decisions?: No Does the patient have difficulty walking or climbing stairs?: No Does the patient have difficulty dressing or bathing?: No Does the patient have difficulty doing errands alone such as visiting a doctor's office or shopping?: No   Past Medical History:  Past Medical History:  Diagnosis Date   Amenorrhea    Anxiety    Kidney stone    PCOS (polycystic ovarian syndrome)    Post partum depression     Surgical History:  Past Surgical History:  Procedure Laterality Date   ABDOMINAL SURGERY  10/24/2021   vessel ablation   BREAST EXCISIONAL BIOPSY Right 2018   fibroadenoma   BREAST EXCISIONAL BIOPSY Left 2010   fibroadenoma   BREAST FIBROADENOMA SURGERY  2008,2010   EXTRACORPOREAL SHOCK WAVE LITHOTRIPSY Right 05/16/2020   Procedure: EXTRACORPOREAL SHOCK WAVE LITHOTRIPSY (ESWL);  Surgeon: Riki Altes, MD;  Location: [facility] ORS;  Service: Urology;  Laterality: Right;    Medications:  Current Outpatient Medications on  File Prior to Visit  Medication Sig   albuterol (VENTOLIN HFA) 108 (90 Base) MCG/ACT inhaler INHALE 2 PUFFS INTO THE LUNGS EVERY 6 HOURS AS NEEDED   buPROPion (WELLBUTRIN XL) 300 MG 24 hr tablet Take 1 tablet (300 mg total) by mouth daily.   rizatriptan (MAXALT-MLT) 5 MG disintegrating tablet Take 1 tablet (5 mg total) by mouth as needed for migraine. May repeat in 2 hours if needed   No current facility-administered medications on file prior to visit.    Allergies:  Allergies  Allergen Reactions   Mucinex Dm [Dm-Guaifenesin Er] Hives  and Swelling   Ciprofloxacin Rash    Social History:  Social History   Socioeconomic History   Marital status: Married    Spouse name: Gerre Pebbles   Number of children: 3   Years of education: Not on file   Highest education level: Some college, no degree  Occupational History   Not on file  Tobacco Use   Smoking status: Never    Passive exposure: Never   Smokeless tobacco: Never  Vaping Use   Vaping status: Never Used  Substance and Sexual Activity   Alcohol use: Yes    Comment: occas   Drug use: No   Sexual activity: Yes    Birth control/protection: Condom    Comment: undecided  Other Topics Concern   Not on file  Social History Narrative   All boys at home -- age 80, 46, 2.  Is going back at [employer].   Social Determinants of Health   Financial Resource Strain: Low Risk  (01/04/2023)   Overall Financial Resource Strain (CARDIA)    Difficulty of Paying Living Expenses: Not hard at all  Food Insecurity: No Food Insecurity (01/04/2023)   Hunger Vital Sign    Worried About Running Out of Food in the Last Year: Never true    Ran Out of Food in the Last Year: Never true  Transportation Needs: No Transportation Needs (01/04/2023)   PRAPARE - Administrator, Civil Service (Medical): No    Lack of Transportation (Non-Medical): No  Physical Activity: Insufficiently Active (01/04/2023)   Exercise Vital Sign    Days of Exercise per Week:  3 days    Minutes of Exercise per Session: 30 min  Stress: No Stress Concern Present (01/04/2023)   [employer] of Occupational Health - Occupational Stress Questionnaire    Feeling of Stress : Only a little  Social Connections: Moderately Isolated (01/04/2023)   Social Connection and Isolation Panel [NHANES]    Frequency of Communication with Friends and Family: Never    Frequency of Social Gatherings with Friends and Family: Once a week    Attends Religious Services: More than 4 times per year    Active Member of [employer] or Organizations: No    Attends Banker Meetings: Not on file    Marital Status: Married  Catering manager Violence: Not At Risk (04/16/2021)   Humiliation, Afraid, Rape, and Kick questionnaire    Fear of Current or Ex-Partner: No    Emotionally Abused: No    Physically Abused: No    Sexually Abused: No   Social History   Tobacco Use  Smoking Status Never   Passive exposure: Never  Smokeless Tobacco Never   Social History   Substance and Sexual Activity  Alcohol Use Yes   Comment: occas    Family History:  Family History  Problem Relation Age of Onset   Cancer Mother        cervical, precancerous   Cancer Father        Colon, Precancerous   Seizures Sister    Lung cancer Sister    Heart Problems Sister    Hashimoto's thyroiditis Brother    Learning disabilities Son    Cancer Maternal Grandmother        brain   Healthy Maternal Grandfather    Healthy Paternal Grandmother    Aneurysm Paternal Grandfather        Brain   Breast cancer Neg Hx     Past medical history, surgical history, medications, allergies,  family history and social history reviewed with patient today and changes made to appropriate areas of the chart.   ROS All other ROS negative except what is listed above and in the HPI.      Objective:    BP 101/68 (BP Location: Left Arm, Patient Position: Sitting, Cuff Size: Normal)   Pulse 74   Temp 98.2 F (36.8 C)  (Oral)   Ht 5\' 3"  (1.6 m)   Wt 149 lb (67.6 kg)   SpO2 98%   BMI 26.39 kg/m   Wt Readings from Last 3 Encounters:  08/06/23 149 lb (67.6 kg)  06/21/23 149 lb (67.6 kg)  01/04/23 152 lb 12.8 oz (69.3 kg)    Physical Exam Vitals and nursing note reviewed. Exam conducted with a chaperone present.  Constitutional:      General: She is awake. She is not in acute distress.    Appearance: She is well-developed and well-groomed. She is not ill-appearing or toxic-appearing.  HENT:     Head: Normocephalic and atraumatic.     Right Ear: Hearing, tympanic membrane, ear canal and external ear normal. No drainage.     Left Ear: Hearing, tympanic membrane, ear canal and external ear normal. No drainage.     Nose: Nose normal.     Right Sinus: No maxillary sinus tenderness or frontal sinus tenderness.     Left Sinus: No maxillary sinus tenderness or frontal sinus tenderness.     Mouth/Throat:     Mouth: Mucous membranes are moist.     Pharynx: Oropharynx is clear. Uvula midline. No pharyngeal swelling, oropharyngeal exudate or posterior oropharyngeal erythema.  Eyes:     General: Lids are normal.        Right eye: No discharge.        Left eye: No discharge.     Extraocular Movements: Extraocular movements intact.     Conjunctiva/sclera: Conjunctivae normal.     Pupils: Pupils are equal, round, and reactive to light.     Visual Fields: Right eye visual fields normal and left eye visual fields normal.  Neck:     Thyroid: No thyromegaly.     Vascular: No carotid bruit.     Trachea: Trachea normal.  Cardiovascular:     Rate and Rhythm: Normal rate and regular rhythm.     Heart sounds: Normal heart sounds. No murmur heard.    No gallop.  Pulmonary:     Effort: Pulmonary effort is normal. No accessory muscle usage or respiratory distress.     Breath sounds: Normal breath sounds.  Chest:  Breasts:    Right: Normal.     Left: Normal.  Abdominal:     General: Bowel sounds are normal.      Palpations: Abdomen is soft. There is no hepatomegaly or splenomegaly.     Tenderness: There is no abdominal tenderness.  Musculoskeletal:        General: Normal range of motion.     Cervical back: Normal range of motion and neck supple.     Right lower leg: No edema.     Left lower leg: No edema.  Lymphadenopathy:     Head:     Right side of head: No submental, submandibular, tonsillar, preauricular or posterior auricular adenopathy.     Left side of head: No submental, submandibular, tonsillar, preauricular or posterior auricular adenopathy.     Cervical: No cervical adenopathy.     Upper Body:     Right upper body: No supraclavicular, axillary  or pectoral adenopathy.     Left upper body: No supraclavicular, axillary or pectoral adenopathy.  Skin:    General: Skin is warm and dry.     Capillary Refill: Capillary refill takes less than 2 seconds.     Findings: No rash.  Neurological:     Mental Status: She is alert and oriented to person, place, and time.     Gait: Gait is intact.     Deep Tendon Reflexes: Reflexes are normal and symmetric.     Reflex Scores:      Brachioradialis reflexes are 2+ on the right side and 2+ on the left side.      Patellar reflexes are 2+ on the right side and 2+ on the left side. Psychiatric:        Attention and Perception: Attention normal.        Mood and Affect: Mood normal.        Speech: Speech normal.        Behavior: Behavior normal. Behavior is cooperative.        Thought Content: Thought content normal.        Judgment: Judgment normal.    Results for orders placed or performed in visit on 08/04/22  [id number] 11+Oxyco+Alc+Crt-Bund  Result Value Ref Range   Ethanol Negative Cutoff=0.020 %   Amphetamines, Urine Negative Cutoff=1000 ng/mL   Barbiturate Negative Cutoff=200 ng/mL   BENZODIAZ UR QL Negative Cutoff=200 ng/mL   Cannabinoid Quant, Ur Negative Cutoff=50 ng/mL   Cocaine (Metabolite) Negative Cutoff=300 ng/mL   OPIATE SCREEN URINE  Negative Cutoff=300 ng/mL   Oxycodone/Oxymorphone, Urine Negative Cutoff=300 ng/mL   Phencyclidine Negative Cutoff=25 ng/mL   Methadone Screen, Urine Negative Cutoff=300 ng/mL   Propoxyphene Negative Cutoff=300 ng/mL   Meperidine Negative Cutoff=200 ng/mL   Tramadol Negative Cutoff=200 ng/mL   Creatinine 65.5 20.0 - 300.0 mg/dL   pH, Urine 6.5 4.5 - 8.9  CBC with Differential/Platelet  Result Value Ref Range   WBC 8.8 3.4 - 10.8 x10E3/uL   RBC 4.63 3.77 - 5.28 x10E6/uL   Hemoglobin 14.1 11.1 - 15.9 g/dL   Hematocrit 04.5 40.9 - 46.6 %   MCV 87 79 - 97 fL   MCH 30.5 26.6 - 33.0 pg   MCHC 35.0 31.5 - 35.7 g/dL   RDW 81.1 91.4 - 78.2 %   Platelets 308 150 - 450 x10E3/uL   Neutrophils 64 Not Estab. %   Lymphs 26 Not Estab. %   Monocytes 7 Not Estab. %   Eos 2 Not Estab. %   Basos 1 Not Estab. %   Neutrophils Absolute 5.7 1.4 - 7.0 x10E3/uL   Lymphocytes Absolute 2.2 0.7 - 3.1 x10E3/uL   Monocytes Absolute 0.6 0.1 - 0.9 x10E3/uL   EOS (ABSOLUTE) 0.2 0.0 - 0.4 x10E3/uL   Basophils Absolute 0.1 0.0 - 0.2 x10E3/uL   Immature Granulocytes 0 Not Estab. %   Immature Grans (Abs) 0.0 0.0 - 0.1 x10E3/uL  Comprehensive metabolic panel  Result Value Ref Range   Glucose 82 70 - 99 mg/dL   BUN 16 6 - 20 mg/dL   Creatinine, Ser 9.56 0.57 - 1.00 mg/dL   eGFR 213 >08 MV/HQI/6.96   BUN/Creatinine Ratio 22 9 - 23   Sodium 137 134 - 144 mmol/L   Potassium 4.0 3.5 - 5.2 mmol/L   Chloride 101 96 - 106 mmol/L   CO2 22 20 - 29 mmol/L   Calcium 9.9 8.7 - 10.2 mg/dL   Total Protein 7.6 6.0 -  8.5 g/dL   Albumin 5.0 (H) 3.9 - 4.9 g/dL   Globulin, Total 2.6 1.5 - 4.5 g/dL   Albumin/Globulin Ratio 1.9 1.2 - 2.2   Bilirubin Total 0.3 0.0 - 1.2 mg/dL   Alkaline Phosphatase 61 44 - 121 IU/L   AST 18 0 - 40 IU/L   ALT 16 0 - 32 IU/L  Lipid Panel w/o Chol/HDL Ratio  Result Value Ref Range   Cholesterol, Total 203 (H) 100 - 199 mg/dL   Triglycerides 528 0 - 149 mg/dL   HDL 57 >41 mg/dL   VLDL  Cholesterol Cal 19 5 - 40 mg/dL   LDL Chol Calc (NIH) 324 (H) 0 - 99 mg/dL  TSH  Result Value Ref Range   TSH 1.730 0.450 - 4.500 uIU/mL  HgB A1c  Result Value Ref Range   Hgb A1c MFr Bld 5.2 4.8 - 5.6 %   Est. average glucose Bld gHb Est-mCnc 103 mg/dL  Vitamin M01  Result Value Ref Range   Vitamin B-12 455 232 - 1,245 pg/mL  Hepatitis C antibody  Result Value Ref Range   Hep C Virus Ab Non Reactive Non Reactive  Gamma GT  Result Value Ref Range   GGT 14 0 - 60 IU/L      Assessment & Plan:   Problem List Items Addressed This Visit       Cardiovascular and Mediastinum   Migraine with aura    Chronic, some worsening over past months.  At this time continue current OTC medications at home and adjust as needed and triptan as needed.  Referral to neurology to look into Botox which helped her a lot in past.      Relevant Medications   sertraline (ZOLOFT) 100 MG tablet   Other Relevant Orders   CBC with Differential/Platelet   Ambulatory referral to Neurology     Other   Elevated low density lipoprotein (LDL) cholesterol level    Stable.  Noted on past labs, recheck today and continue diet focus.      Relevant Orders   Comprehensive metabolic panel   Lipid Panel w/o Chol/HDL Ratio   Generalized anxiety disorder    Refer to depression plan of care for further.      Relevant Medications   sertraline (ZOLOFT) 100 MG tablet   ALPRAZolam (XANAX) 0.5 MG tablet   Other Relevant Orders   [id number] 11+Oxyco+Alc+Crt-Bund   Long term prescription benzodiazepine use    Educated at length on risks of long term use -- refer to anxiety plan.  UDS due next 08/05/24 and controlled subs contract up to date July 2022.      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  Denies SI/HI.  Continue Zoloft 200 MG daily and maintain Xanax as ordered, has been on for >6  years and does not want to change.  Continue Wellbutrin XL 300 MG daily, as is offering some improvement to concentration and motivation + anhedonia.  Educated her on this medication, including use + side effects.  Discussed BLACK BOX warning and recommend she immediately alert PCP or go to ER if SI presents. Recommend return to therapy in future, which could offer benefit to mood.  UDS due 08/05/24 and controlled substance agreement up to date.  Refills sent.       Relevant Medications   sertraline (ZOLOFT) 100 MG tablet   ALPRAZolam Prudy Feeler)  0.5 MG tablet   Other Relevant Orders   TSH   Vitamin B 12 deficiency    Ongoing, continue supplement.  Recheck level today.      Relevant Orders   Vitamin B12   Other Visit Diagnoses     Flu vaccine refused       Refused flu vaccine, educated on this.   Relevant Orders   Flu vaccine trivalent PF, 6mos and older(Flulaval,Afluria,Fluarix,Fluzone)   Encounter for annual physical exam       Annual physical today with labs and health maintenance reviewed, discussed with patient.        Follow up plan: Return in about 3 months (around 11/06/2023) for Depression and Anxiety.   LABORATORY TESTING:  - Pap smear: up to date  IMMUNIZATIONS:   - Tdap: Tetanus vaccination status reviewed: last tetanus booster within 10 years. - Influenza: Refused - discussed with patient - Pneumovax: Not applicable - Prevnar: Not applicable - HPV: Not applicable - Zostavax vaccine: Not applicable  SCREENING: -Mammogram: has been performed, does not need to repeat until age 7 - Colonoscopy: Not applicable  - Bone Density: Not applicable  -Hearing Test: Not applicable  -Spirometry: Not applicable   PATIENT COUNSELING:   Advised to take 1 mg of folate supplement per day if capable of pregnancy.   Sexuality: Discussed sexually transmitted diseases, partner selection, use of condoms, avoidance of unintended pregnancy  and contraceptive alternatives.    Advised to avoid cigarette smoking.  I discussed with the patient that most people either abstain from alcohol or drink within safe limits (<=14/week and <=4 drinks/occasion for males, <=7/weeks and <= 3 drinks/occasion for females) and that the risk for alcohol disorders and other health effects rises proportionally with the number of drinks per week and how often a drinker exceeds daily limits.  Discussed cessation/primary prevention of drug use and availability of treatment for abuse.   Diet: Encouraged to adjust caloric intake to maintain  or achieve ideal body weight, to reduce intake of dietary saturated fat and total fat, to limit sodium intake by avoiding high sodium foods and not adding table salt, and to maintain adequate dietary potassium and calcium preferably from fresh fruits, vegetables, and low-fat dairy products.    Stressed the importance of regular exercise  Injury prevention: Discussed safety belts, safety helmets, smoke detector, smoking near bedding or upholstery.   Dental health: Discussed importance of regular tooth brushing, flossing, and dental visits.    NEXT PREVENTATIVE PHYSICAL DUE IN 1 YEAR. Return in about 3 months (around 11/06/2023) for Depression and Anxiety.

## 2023-08-06 NOTE — Assessment & Plan Note (Signed)
Ongoing, continue supplement.  Recheck level today.

## 2023-08-07 LAB — COMPREHENSIVE METABOLIC PANEL
ALT: 11 [IU]/L (ref 0–32)
AST: 20 [IU]/L (ref 0–40)
Albumin: 4.8 g/dL (ref 3.9–4.9)
Alkaline Phosphatase: 54 [IU]/L (ref 44–121)
BUN/Creatinine Ratio: 14 (ref 9–23)
BUN: 11 mg/dL (ref 6–20)
Bilirubin Total: 0.3 mg/dL (ref 0.0–1.2)
CO2: 23 mmol/L (ref 20–29)
Calcium: 9.7 mg/dL (ref 8.7–10.2)
Chloride: 99 mmol/L (ref 96–106)
Creatinine, Ser: 0.79 mg/dL (ref 0.57–1.00)
Globulin, Total: 2.4 g/dL (ref 1.5–4.5)
Glucose: 81 mg/dL (ref 70–99)
Potassium: 4.1 mmol/L (ref 3.5–5.2)
Sodium: 138 mmol/L (ref 134–144)
Total Protein: 7.2 g/dL (ref 6.0–8.5)
eGFR: 101 mL/min/{1.73_m2} (ref >59)

## 2023-08-07 LAB — CBC WITH DIFFERENTIAL/PLATELET
Basophils Absolute: 0.1 10*3/uL (ref 0.0–0.2)
Basos: 1 %
EOS (ABSOLUTE): 0.1 10*3/uL (ref 0.0–0.4)
Eos: 1 %
Hematocrit: 39.3 % (ref 34.0–46.6)
Hemoglobin: 12.9 g/dL (ref 11.1–15.9)
Immature Grans (Abs): 0 10*3/uL (ref 0.0–0.1)
Immature Granulocytes: 0 %
Lymphocytes Absolute: 2 10*3/uL (ref 0.7–3.1)
Lymphs: 30 %
MCH: 29.7 pg (ref 26.6–33.0)
MCHC: 32.8 g/dL (ref 31.5–35.7)
MCV: 90 fL (ref 79–97)
Monocytes Absolute: 0.5 10*3/uL (ref 0.1–0.9)
Monocytes: 7 %
Neutrophils Absolute: 4 10*3/uL (ref 1.4–7.0)
Neutrophils: 61 %
Platelets: 278 10*3/uL (ref 150–450)
RBC: 4.35 x10E6/uL (ref 3.77–5.28)
RDW: 12.5 % (ref 11.7–15.4)
WBC: 6.6 10*3/uL (ref 3.4–10.8)

## 2023-08-07 LAB — LIPID PANEL W/O CHOL/HDL RATIO
Cholesterol, Total: 199 mg/dL (ref 100–199)
HDL: 65 mg/dL (ref >39)
LDL Chol Calc (NIH): 116 mg/dL — ABNORMAL HIGH (ref 0–99)
Triglycerides: 99 mg/dL (ref 0–149)
VLDL Cholesterol Cal: 18 mg/dL (ref 5–40)

## 2023-08-07 LAB — VITAMIN B12: Vitamin B-12: 360 pg/mL (ref 232–1245)

## 2023-08-07 LAB — TSH: TSH: 0.981 u[IU]/mL (ref 0.450–4.500)

## 2023-08-08 NOTE — Progress Notes (Signed)
Contacted via MyChart   Good morning Stephanie Logan, your labs are at home and overall look fantastic with exception of LDL, bad cholesterol, which remains a bit elevated but better then last check.  Continue focus on diet and regular exercise.  B12 on lower side of normal, do recommend a daily Vitamin B12 1000 MCG supplement daily.  This is good for the nervous system.  Any questions? Keep being amazing!!  Thank you for allowing me to participate in your care.  I appreciate you. Kindest regards, Ilse Billman

## 2023-08-09 LAB — DRUG SCREEN 764883 11+OXYCO+ALC+CRT-BUND
Amphetamines, Urine: NEGATIVE ng/mL
BENZODIAZ UR QL: NEGATIVE ng/mL
Barbiturate: NEGATIVE ng/mL
Cannabinoid Quant, Ur: NEGATIVE ng/mL
Cocaine (Metabolite): NEGATIVE ng/mL
Creatinine: 19.8 mg/dL — ABNORMAL LOW (ref 20.0–300.0)
Ethanol: NEGATIVE %
Meperidine: NEGATIVE ng/mL
Methadone Screen, Urine: NEGATIVE ng/mL
OPIATE SCREEN URINE: NEGATIVE ng/mL
Oxycodone/Oxymorphone, Urine: NEGATIVE ng/mL
Phencyclidine: NEGATIVE ng/mL
Propoxyphene: NEGATIVE ng/mL
Tramadol: NEGATIVE ng/mL
pH, Urine: 6.3 (ref 4.5–8.9)

## 2023-08-09 LAB — SPECIFIC GRAVITY: Specific Gravity: 1.0034

## 2023-09-11 ENCOUNTER — Other Ambulatory Visit: Payer: Self-pay | Admitting: Nurse Practitioner

## 2023-09-13 NOTE — Telephone Encounter (Signed)
Called [facility] and they had a previous reorder that was for #180 no refills. Advised that they do see the new order sent in on 08/06/23. Advised that they will go ahead a fill med. Called pt and LM on VM that nedication is being filled and has 4 refills.   Requested Prescriptions  Pending Prescriptions Disp Refills   sertraline (ZOLOFT) 100 MG tablet [Pharmacy Med Name: SERTRALINE 100MG  TABLETS] 180 tablet 4    Sig: TAKE 2 TABLETS(200 MG) BY MOUTH DAILY     Psychiatry:  Antidepressants - SSRI - sertraline Passed - 09/13/2023  1:16 PM      Passed - AST in normal range and within 360 days    AST  Date Value Ref Range Status  08/06/2023 20 0 - 40 IU/L Final         Passed - ALT in normal range and within 360 days    ALT  Date Value Ref Range Status  08/06/2023 11 0 - 32 IU/L Final         Passed - Completed PHQ-2 or PHQ-9 in the last 360 days      Passed - Valid encounter within last 6 months    Recent Outpatient Visits           1 month ago Severe episode of recurrent major depressive disorder, without psychotic features (HCC)   [place] [facility] [place], [place] T, NP   2 months ago Subacute cough   [facility] [facility] [place], [place] T, NP   8 months ago Severe episode of recurrent major depressive disorder, without psychotic features (HCC)   [place] [facility] [place], [place] T, NP   9 months ago Generalized anxiety disorder   [place] [facility] [place], [place] T, NP   10 months ago Severe episode of recurrent major depressive disorder, without psychotic features (HCC)   [place] [facility] [place], Dorie Rank, NP       Future Appointments             In 1 month [place], Dorie Rank, NP [facility]

## 2023-09-13 NOTE — Telephone Encounter (Signed)
FYI

## 2023-10-12 ENCOUNTER — Other Ambulatory Visit: Payer: Self-pay | Admitting: Nurse Practitioner

## 2023-10-12 NOTE — Telephone Encounter (Signed)
Requested Prescriptions  Pending Prescriptions Disp Refills   buPROPion (WELLBUTRIN XL) 300 MG 24 hr tablet [Pharmacy Med Name: BUPROPION XL 300MG  TABLETS] 90 tablet 0    Sig: TAKE 1 TABLET(300 MG) BY MOUTH DAILY     Psychiatry: Antidepressants - bupropion Passed - 10/12/2023  8:47 AM      Passed - Cr in normal range and within 360 days    Creatinine  Date Value Ref Range Status  08/06/2023 19.8 (L) 20.0 - 300.0 mg/dL Final   Creatinine, Ser  Date Value Ref Range Status  08/06/2023 0.79 0.57 - 1.00 mg/dL Final         Passed - AST in normal range and within 360 days    AST  Date Value Ref Range Status  08/06/2023 20 0 - 40 IU/L Final         Passed - ALT in normal range and within 360 days    ALT  Date Value Ref Range Status  08/06/2023 11 0 - 32 IU/L Final         Passed - Completed PHQ-2 or PHQ-9 in the last 360 days      Passed - Last BP in normal range    BP Readings from Last 1 Encounters:  08/06/23 101/68         Passed - Valid encounter within last 6 months    Recent Outpatient Visits           2 months ago Severe episode of recurrent major depressive disorder, without psychotic features (HCC)   [place] [facility] [place], [place] T, NP   3 months ago Subacute cough   [place] [facility] [place], [place] T, NP   9 months ago Severe episode of recurrent major depressive disorder, without psychotic features (HCC)   [place] [facility] [place], [place] T, NP   10 months ago Generalized anxiety disorder   [place] [facility] [place], [place] T, NP   11 months ago Severe episode of recurrent major depressive disorder, without psychotic features (HCC)   [place] [facility] [place], Dorie Rank, NP       Future Appointments             In 3 weeks [place], Dorie Rank, NP  [facility], [facility]

## 2023-10-14 ENCOUNTER — Encounter: Payer: Self-pay | Admitting: Nurse Practitioner

## 2023-10-15 ENCOUNTER — Telehealth (INDEPENDENT_AMBULATORY_CARE_PROVIDER_SITE_OTHER): Admitting: Nurse Practitioner

## 2023-10-15 ENCOUNTER — Encounter: Payer: Self-pay | Admitting: Nurse Practitioner

## 2023-10-15 DIAGNOSIS — L0390 Cellulitis, unspecified: Secondary | ICD-10-CM | POA: Insufficient documentation

## 2023-10-15 DIAGNOSIS — L03811 Cellulitis of head [any part, except face]: Secondary | ICD-10-CM | POA: Diagnosis not present

## 2023-10-15 MED ORDER — SULFAMETHOXAZOLE-TRIMETHOPRIM 800-160 MG PO TABS: 1.0000 | ORAL_TABLET | Freq: Two times a day (BID) | ORAL | 0 refills | Status: AC

## 2023-10-15 NOTE — Assessment & Plan Note (Signed)
To right cheek for a few days.  Suspect bug bite.  Will send in Bactrim to treat, is improving with warm compresses and Mupirocin, continue these.  Monitor area closely and if any worsening immediately alert provider.

## 2023-10-15 NOTE — Telephone Encounter (Signed)
Appointment has been made

## 2023-10-15 NOTE — Progress Notes (Signed)
There were no vitals taken for this visit.   Subjective:    Patient ID: Stephanie Logan, female    DOB: [birth year], 33 y.o.   MRN: [id number]  HPI: Stephanie Logan is a 33 y.o. female  Chief Complaint  Patient presents with   Spot on Face    Patient states she has a spot on her face that has been a little red and warm to the touch since Monday.    Virtual Visit via Video Note  I connected with Leonia Heatherly Bohanon on 10/15/23 at  4:20 PM EST by a video enabled telemedicine application and verified that I am speaking with the correct person using two identifiers.  Location: Patient: home Provider: work   I discussed the limitations of evaluation and management by telemedicine and the availability of in person appointments. The patient expressed understanding and agreed to proceed.  I discussed the assessment and treatment plan with the patient. The patient was provided an opportunity to ask questions and all were answered. The patient agreed with the plan and demonstrated an understanding of the instructions.   The patient was advised to call back or seek an in-person evaluation if the symptoms worsen or if the condition fails to improve as anticipated.  I provided 25 minutes of non-face-to-face time during this encounter.   Marjie Skiff, NP   SKIN INFECTION Started to right side of face on Monday.  Does not recall insect bite or any recent trauma.  Woke-up and felt bruised.  Yesterday was much more swollen and red, today is improving. Duration: days Location: as above History of trauma in area: no Pain: yes Quality: yes Severity: 2/10 Redness: yes Swelling: yes Oozing: no Pus: no Fevers: no Nausea/vomiting: no Status:  slowly improving Treatments attempted: warm compresses and Mupirocin  Tetanus: UTD   Relevant past medical, surgical, family and social history reviewed and updated as indicated. Interim medical history since our last visit reviewed. Allergies  and medications reviewed and updated.  Review of Systems  Constitutional:  Negative for activity change, appetite change, diaphoresis, fatigue and fever.  Respiratory:  Negative for cough, chest tightness, shortness of breath and wheezing.   Cardiovascular:  Negative for chest pain, palpitations and leg swelling.  Gastrointestinal: Negative.   Endocrine: Negative.   Skin:  Positive for wound.  Neurological: Negative.   Psychiatric/Behavioral: Negative.      Per HPI unless specifically indicated above     Objective:    There were no vitals taken for this visit.  Wt Readings from Last 3 Encounters:  08/06/23 149 lb (67.6 kg)  06/21/23 149 lb (67.6 kg)  01/04/23 152 lb 12.8 oz (69.3 kg)    Physical Exam Vitals and nursing note reviewed.  Constitutional:      General: She is awake. She is not in acute distress.    Appearance: She is well-developed. She is not ill-appearing.  HENT:     Head: Normocephalic.     Right Ear: Hearing normal.     Left Ear: Hearing normal.  Eyes:     General: Lids are normal.        Right eye: No discharge.        Left eye: No discharge.     Conjunctiva/sclera: Conjunctivae normal.  Pulmonary:     Effort: Pulmonary effort is normal. No accessory muscle usage or respiratory distress.  Musculoskeletal:     Cervical back: Normal range of motion.  Skin:      Neurological:  Mental Status: She is alert and oriented to person, place, and time.  Psychiatric:        Attention and Perception: Attention normal.        Mood and Affect: Mood normal.        Behavior: Behavior normal. Behavior is cooperative.        Thought Content: Thought content normal.        Judgment: Judgment normal.     Results for orders placed or performed in visit on 08/06/23  [id number] 11+Oxyco+Alc+Crt-Bund   Collection Time: 08/06/23  1:12 PM  Result Value Ref Range   Ethanol Negative Cutoff=0.020 %   Amphetamines, Urine Negative Cutoff=1000 ng/mL   Barbiturate Negative  Cutoff=200 ng/mL   BENZODIAZ UR QL Negative Cutoff=200 ng/mL   Cannabinoid Quant, Ur Negative Cutoff=50 ng/mL   Cocaine (Metabolite) Negative Cutoff=300 ng/mL   OPIATE SCREEN URINE Negative Cutoff=300 ng/mL   Oxycodone/Oxymorphone, Urine Negative Cutoff=300 ng/mL   Phencyclidine Negative Cutoff=25 ng/mL   Methadone Screen, Urine Negative Cutoff=300 ng/mL   Propoxyphene Negative Cutoff=300 ng/mL   Meperidine Negative Cutoff=200 ng/mL   Tramadol Negative Cutoff=200 ng/mL   Creatinine 19.8 (L) 20.0 - 300.0 mg/dL   pH, Urine 6.3 4.5 - 8.9  Specific Gravity   Collection Time: 08/06/23  1:12 PM  Result Value Ref Range   Specific Gravity 1.0034   CBC with Differential/Platelet   Collection Time: 08/06/23  1:30 PM  Result Value Ref Range   WBC 6.6 3.4 - 10.8 x10E3/uL   RBC 4.35 3.77 - 5.28 x10E6/uL   Hemoglobin 12.9 11.1 - 15.9 g/dL   Hematocrit 04.5 40.9 - 46.6 %   MCV 90 79 - 97 fL   MCH 29.7 26.6 - 33.0 pg   MCHC 32.8 31.5 - 35.7 g/dL   RDW 81.1 91.4 - 78.2 %   Platelets 278 150 - 450 x10E3/uL   Neutrophils 61 Not Estab. %   Lymphs 30 Not Estab. %   Monocytes 7 Not Estab. %   Eos 1 Not Estab. %   Basos 1 Not Estab. %   Neutrophils Absolute 4.0 1.4 - 7.0 x10E3/uL   Lymphocytes Absolute 2.0 0.7 - 3.1 x10E3/uL   Monocytes Absolute 0.5 0.1 - 0.9 x10E3/uL   EOS (ABSOLUTE) 0.1 0.0 - 0.4 x10E3/uL   Basophils Absolute 0.1 0.0 - 0.2 x10E3/uL   Immature Granulocytes 0 Not Estab. %   Immature Grans (Abs) 0.0 0.0 - 0.1 x10E3/uL  Comprehensive metabolic panel   Collection Time: 08/06/23  1:30 PM  Result Value Ref Range   Glucose 81 70 - 99 mg/dL   BUN 11 6 - 20 mg/dL   Creatinine, Ser 9.56 0.57 - 1.00 mg/dL   eGFR 213 >08 MV/HQI/6.96   BUN/Creatinine Ratio 14 9 - 23   Sodium 138 134 - 144 mmol/L   Potassium 4.1 3.5 - 5.2 mmol/L   Chloride 99 96 - 106 mmol/L   CO2 23 20 - 29 mmol/L   Calcium 9.7 8.7 - 10.2 mg/dL   Total Protein 7.2 6.0 - 8.5 g/dL   Albumin 4.8 3.9 - 4.9 g/dL    Globulin, Total 2.4 1.5 - 4.5 g/dL   Bilirubin Total 0.3 0.0 - 1.2 mg/dL   Alkaline Phosphatase 54 44 - 121 IU/L   AST 20 0 - 40 IU/L   ALT 11 0 - 32 IU/L  Lipid Panel w/o Chol/HDL Ratio   Collection Time: 08/06/23  1:30 PM  Result Value Ref Range   Cholesterol, Total 199  100 - 199 mg/dL   Triglycerides 99 0 - 149 mg/dL   HDL 65 >16 mg/dL   VLDL Cholesterol Cal 18 5 - 40 mg/dL   LDL Chol Calc (NIH) 109 (H) 0 - 99 mg/dL  TSH   Collection Time: 08/06/23  1:30 PM  Result Value Ref Range   TSH 0.981 0.450 - 4.500 uIU/mL  Vitamin B12   Collection Time: 08/06/23  1:30 PM  Result Value Ref Range   Vitamin B-12 360 232 - 1,245 pg/mL      Assessment & Plan:   Problem List Items Addressed This Visit       Other   Cellulitis - Primary   To right cheek for a few days.  Suspect bug bite.  Will send in Bactrim to treat, is improving with warm compresses and Mupirocin, continue these.  Monitor area closely and if any worsening immediately alert provider.          Follow up plan: Return if symptoms worsen or fail to improve.

## 2023-10-15 NOTE — Patient Instructions (Signed)
Cellulitis, Adult    Cellulitis is a skin infection. The infected area is often warm, red, swollen, and sore. It occurs most often on the legs, feet, and toes, but can happen on any part of the body.  This condition can be life-threatening without treatment. It is very important to get treated right away.  What are the causes?  This condition is caused by bacteria. The bacteria enter through a break in the skin, such as:  A cut.  A burn.  A bug bite.  An animal bite.  An open sore.  A crack.  What increases the risk?  Having a weak body's defense system (immune system).  Being older than 35 years old.  Having a blood sugar problem (diabetes).  Having a long-term liver disease (cirrhosis) or kidney disease.  Being very overweight (obese).  Having a skin problem, such as:  An itchy rash.  A rash caused by a fungus.  A rash with blisters.  Slow movement of blood in the veins (venous stasis).  Fluid buildup below the skin (edema).  This condition is more likely to occur in people who:  Have open cuts, burns, bites, or scrapes on the skin.  Have been treated with high-energy rays (radiation).  Use IV drugs.  What are the signs or symptoms?  Skin that:  Looks red or purple, or slightly darker than your usual skin color.  Has streaks.  Has spots.  Is swollen.  Is sore or painful when you touch it.  Is warm.  A fever.  Chills.  Blisters.  Tiredness (fatigue).  How is this treated?  Medicines to treat infections or allergies.  Rest.  Placing cold or warm cloths on the skin.  Staying in the hospital, if the condition is very bad. You may need medicines through an IV.  Follow these instructions at home:  Medicines  Take over-the-counter and prescription medicines only as told by your doctor.  If you were prescribed antibiotics, take them as told by your doctor. Do not stop using them even if you start to feel better.  General instructions  Drink enough fluid to keep your pee (urine) pale yellow.  Do not touch or rub the  infected area.  Raise (elevate) the infected area above the level of your heart while you are sitting or lying down.  Return to your normal activities when your doctor says that it is safe.  Place cold or warm cloths on the area as told by your doctor.  Keep all follow-up visits. Your doctor will need to make sure that a more serious infection is not developing.  Contact a doctor if:  You have a fever.  You do not start to get better after 1-2 days of treatment.  Your bone or joint under the infected area starts to hurt after the skin has healed.  Your infection comes back in the same area or another area. Signs of this may include:  You have a swollen bump in the area.  Your red area gets larger, turns dark in color, or hurts more.  You have more fluid coming from the wound.  Pus or a bad smell develops in your infected area.  You have more pain.  You feel sick and have muscle aches and weakness.  You develop vomiting or watery poop that will not go away.  Get help right away if:  You see red streaks coming from the area.  You notice the skin turns purple or black and falls  off.  These symptoms may be an emergency. Get help right away. Call 911.  Do not wait to see if the symptoms will go away.  Do not drive yourself to the hospital.  This information is not intended to replace advice given to you by your health care provider. Make sure you discuss any questions you have with your health care provider.  Document Revised: 05/05/2022 Document Reviewed: 05/05/2022  Elsevier Patient Education  2024 [employer].

## 2023-11-06 NOTE — Patient Instructions (Incomplete)
 Be Involved in Caring For Your Health:  Taking Medications When medications are taken as directed, they can greatly improve your health. But if they are not taken as prescribed, they may not work. In some cases, not taking them correctly can be harmful. To help ensure your treatment remains effective and safe, understand your medications and how to take them. Bring your medications to each visit for review by your provider.  Your lab results, notes, and after visit summary will be available on My Chart. We strongly encourage you to use this feature. If lab results are abnormal the clinic will contact you with the appropriate steps. If the clinic does not contact you assume the results are satisfactory. You can always view your results on My Chart. If you have questions regarding your health or results, please contact the clinic during office hours. You can also ask questions on My Chart.  We at [facility] are grateful that you chose [place] to provide your care. We strive to provide evidence-based and compassionate care and are always looking for feedback. If you get a survey from the clinic please complete this so we can hear your opinions.  Managing Anxiety, Adult After being diagnosed with anxiety, you may be relieved to know why you have felt or behaved a certain way. You may also feel overwhelmed about the treatment ahead and what it will mean for your life. With care and support, you can manage your anxiety. How to manage lifestyle changes Understanding the difference between stress and anxiety Although stress can play a role in anxiety, it is not the same as anxiety. Stress is your body's reaction to life changes and events, both good and bad. Stress is often caused by something external, such as a deadline, test, or competition. It normally goes away after the event has ended and will last just a few hours. But, stress can be ongoing and can lead to more than just stress. Anxiety is  caused by something internal, such as imagining a terrible outcome or worrying that something will go wrong that will greatly upset you. Anxiety often does not go away even after the event is over, and it can become a long-term (chronic) worry. Lowering stress and anxiety Talk with your health care provider or a counselor to learn more about lowering anxiety and stress. They may suggest tension-reduction techniques, such as: Music. Spend time creating or listening to music that you enjoy and that inspires you. Mindfulness-based meditation. Practice being aware of your normal breaths while not trying to control your breathing. It can be done while sitting or walking. Centering prayer. Focus on a word, phrase, or sacred image that means something to you and brings you peace. Deep breathing. Expand your stomach and inhale slowly through your nose. Hold your breath for 3-5 seconds. Then breathe out slowly, letting your stomach muscles relax. Self-talk. Learn to notice and spot thought patterns that lead to anxiety reactions. Change those patterns to thoughts that feel peaceful. Muscle relaxation. Take time to tense muscles and then relax them. Choose a tension-reduction technique that fits your lifestyle and personality. These techniques take time and practice. Set aside 5-15 minutes a day to do them. Specialized therapists can offer counseling and training in these techniques. The training to help with anxiety may be covered by some insurance plans. Other things you can do to manage stress and anxiety include: Keeping a stress diary. This can help you learn what triggers your reaction and then learn ways  to manage your response. Thinking about how you react to certain situations. You may not be able to control everything, but you can control your response. Making time for activities that help you relax and not feeling guilty about spending your time in this way. Doing visual imagery. This involves  imagining or creating mental pictures to help you relax. Practicing yoga. Through yoga poses, you can lower tension and relax.  Medicines Medicines for anxiety include: Antidepressant medicines. These are usually prescribed for long-term daily control. Anti-anxiety medicines. These may be added in severe cases, especially when panic attacks occur. When used together, medicines, psychotherapy, and tension-reduction techniques may be the most effective treatment. Relationships Relationships can play a big part in helping you recover. Spend more time connecting with trusted friends and family members. Think about going to couples counseling if you have a partner, taking family education classes, or going to family therapy. Therapy can help you and others better understand your anxiety. How to recognize changes in your anxiety Everyone responds differently to treatment for anxiety. Recovery from anxiety happens when symptoms lessen and stop interfering with your daily life at home or work. This may mean that you will start to: Have better concentration and focus. Worry will interfere less in your daily thinking. Sleep better. Be less irritable. Have more energy. Have improved memory. Try to recognize when your condition is getting worse. Contact your provider if your symptoms interfere with home or work and you feel like your condition is not improving. Follow these instructions at home: Activity Exercise. Adults should: Exercise for at least 150 minutes each week. The exercise should increase your heart rate and make you sweat (moderate-intensity exercise). Do strengthening exercises at least twice a week. Get the right amount and quality of sleep. Most adults need 7-9 hours of sleep each night. Lifestyle  Eat a healthy diet that includes plenty of vegetables, fruits, whole grains, low-fat dairy products, and lean protein. Do not eat a lot of foods that are high in fats, added sugars, or salt  (sodium). Make choices that simplify your life. Do not use any products that contain nicotine or tobacco. These products include cigarettes, chewing tobacco, and vaping devices, such as e-cigarettes. If you need help quitting, ask your provider. Avoid caffeine, alcohol, and certain over-the-counter cold medicines. These may make you feel worse. Ask your pharmacist which medicines to avoid. General instructions Take over-the-counter and prescription medicines only as told by your provider. Keep all follow-up visits. This is to make sure you are managing your anxiety well or if you need more support. Where to find support You can get help and support from: Self-help groups. Online and [employer]. A trusted spiritual leader. Couples counseling. Family education classes. Family therapy. Where to find more information You may find that joining a support group helps you deal with your anxiety. The following sources can help you find counselors or support groups near you: Mental Health [place]: mentalhealthamerica.net Anxiety and Depression Association of [place] (ADAA): adaa.org The [employer] on Mental Illness (NAMI): nami.org Contact a health care provider if: You have a hard time staying focused or finishing tasks. You spend many hours a day feeling worried about everyday life. You are very tired because you cannot stop worrying. You start to have headaches or often feel tense. You have chronic nausea or diarrhea. Get help right away if: Your heart feels like it is racing. You have shortness of breath. You have thoughts of hurting yourself or others. Get help  right away if you feel like you may hurt yourself or others, or have thoughts about taking your own life. Go to your nearest emergency room or: Call 911. Call the National Suicide Prevention Lifeline at 765-482-1593 or 988. This is open 24 hours a day. Text the Crisis Text Line at 504 124 9896. This information is not  intended to replace advice given to you by your health care provider. Make sure you discuss any questions you have with your health care provider. Document Revised: 06/16/2022 Document Reviewed: 12/29/2020 Elsevier Patient Education  2024 [employer].

## 2023-11-08 ENCOUNTER — Ambulatory Visit: Admitting: Nurse Practitioner

## 2023-11-13 NOTE — Patient Instructions (Signed)
 Be Involved in Caring For Your Health:  Taking Medications When medications are taken as directed, they can greatly improve your health. But if they are not taken as prescribed, they may not work. In some cases, not taking them correctly can be harmful. To help ensure your treatment remains effective and safe, understand your medications and how to take them. Bring your medications to each visit for review by your provider.  Your lab results, notes, and after visit summary will be available on My Chart. We strongly encourage you to use this feature. If lab results are abnormal the clinic will contact you with the appropriate steps. If the clinic does not contact you assume the results are satisfactory. You can always view your results on My Chart. If you have questions regarding your health or results, please contact the clinic during office hours. You can also ask questions on My Chart.  We at [facility] are grateful that you chose [place] to provide your care. We strive to provide evidence-based and compassionate care and are always looking for feedback. If you get a survey from the clinic please complete this so we can hear your opinions.  Managing Depression, Adult Depression is a mental health condition that affects your thoughts, feelings, and actions. Being diagnosed with depression can bring you relief if you did not know why you have felt or behaved a certain way. It could also leave you feeling overwhelmed. Finding ways to manage your symptoms can help you feel more positive about your future. How to manage lifestyle changes Being depressed is difficult. Depression can increase the level of everyday stress. Stress can make depression symptoms worse. You may believe your symptoms cannot be managed or will never improve. However, there are many things you can try to help manage your symptoms. There is hope. Managing stress  Stress is your body's reaction to life changes and events,  both good and bad. Stress can add to your feelings of depression. Learning to manage your stress can help lessen your feelings of depression. Try some of the following approaches to reducing your stress (stress reduction techniques): Listen to music that you enjoy and that inspires you. Try using a meditation app or take a meditation class. Develop a practice that helps you connect with your spiritual self. Walk in nature, pray, or go to a place of worship. Practice deep breathing. To do this, inhale slowly through your nose. Pause at the top of your inhale for a few seconds and then exhale slowly, letting yourself relax. Repeat this three or four times. Practice yoga to help relax and work your muscles. Choose a stress reduction technique that works for you. These techniques take time and practice to develop. Set aside 5-15 minutes a day to do them. Therapists can offer training in these techniques. Do these things to help manage stress: Keep a journal. Know your limits. Set healthy boundaries for yourself and others, such as saying "no" when you think something is too much. Pay attention to how you react to certain situations. You may not be able to control everything, but you can change your reaction. Add humor to your life by watching funny movies or shows. Make time for activities that you enjoy and that relax you. Spend less time using electronics, especially at night before bed. The light from screens can make your brain think it is time to get up rather than go to bed.  Medicines Medicines, such as antidepressants, are often a part of  treatment for depression. Talk with your pharmacist or health care provider about all the medicines, supplements, and herbal products that you take, their possible side effects, and what medicines and other products are safe to take together. Make sure to report any side effects you may have to your health care provider. Relationships Your health care  provider may suggest family therapy, couples therapy, or individual therapy as part of your treatment. How to recognize changes Everyone responds differently to treatment for depression. As you recover from depression, you may start to: Have more interest in doing activities. Feel more hopeful. Have more energy. Eat a more regular amount of food. Have better mental focus. It is important to recognize if your depression is not getting better or is getting worse. The symptoms you had in the beginning may return, such as: Feeling tired. Eating too much or too little. Sleeping too much or too little. Feeling restless, agitated, or hopeless. Trouble focusing or making decisions. Having unexplained aches and pains. Feeling irritable, angry, or aggressive. If you or your family members notice these symptoms coming back, let your health care provider know right away. Follow these instructions at home: Activity Try to get some form of exercise each day, such as walking. Try yoga, mindfulness, or other stress reduction techniques. Participate in group activities if you are able. Lifestyle Get enough sleep. Cut down on or stop using caffeine, tobacco, alcohol, and any other harmful substances. Eat a healthy diet that includes plenty of vegetables, fruits, whole grains, low-fat dairy products, and lean protein. Limit foods that are high in solid fats, added sugar, or salt (sodium). General instructions Take over-the-counter and prescription medicines only as told by your health care provider. Keep all follow-up visits. It is important for your health care provider to check on your mood, behavior, and medicines. Your health care provider may need to make changes to your treatment. Where to find support Talking to others  Friends and family members can be sources of support and guidance. Talk to trusted friends or family members about your condition. Explain your symptoms and let them know that you  are working with a health care provider to treat your depression. Tell friends and family how they can help. Finances Find mental health providers that fit with your financial situation. Talk with your health care provider if you are worried about access to food, housing, or medicine. Call your insurance company to learn about your co-pays and prescription plan. Where to find more information You can find support in your area from: Anxiety and Depression Association of America (ADAA): adaa.org Mental Health America: mentalhealthamerica.net [employer] on Mental Illness: nami.org Contact a health care provider if: You stop taking your antidepressant medicines, and you have any of these symptoms: Nausea. Headache. Light-headedness. Chills and body aches. Not being able to sleep (insomnia). You or your friends and family think your depression is getting worse. Get help right away if: You have thoughts of hurting yourself or others. Get help right away if you feel like you may hurt yourself or others, or have thoughts about taking your own life. Go to your nearest emergency room or: Call 911. Call the National Suicide Prevention Lifeline at 315-804-2514 or 988. This is open 24 hours a day. Text the Crisis Text Line at 902-341-0291. This information is not intended to replace advice given to you by your health care provider. Make sure you discuss any questions you have with your health care provider. Document Revised: 01/13/2022 Document Reviewed:  01/13/2022 Elsevier Patient Education  2024 [employer].

## 2023-11-15 ENCOUNTER — Ambulatory Visit: Admitting: Nurse Practitioner

## 2023-11-15 ENCOUNTER — Encounter: Payer: Self-pay | Admitting: Nurse Practitioner

## 2023-11-15 VITALS — BP 92/61 | HR 77 | Temp 98.2°F | Ht 63.0 in | Wt 151.0 lb

## 2023-11-15 DIAGNOSIS — F332 Major depressive disorder, recurrent severe without psychotic features: Secondary | ICD-10-CM

## 2023-11-15 DIAGNOSIS — F411 Generalized anxiety disorder: Secondary | ICD-10-CM | POA: Diagnosis not present

## 2023-11-15 MED ORDER — ALPRAZOLAM 0.5 MG PO TABS: 0.5000 mg | ORAL_TABLET | Freq: Two times a day (BID) | ORAL | 2 refills | Status: DC | PRN

## 2023-11-15 NOTE — Assessment & Plan Note (Signed)
Refer to depression plan of care for further. 

## 2023-11-15 NOTE — Assessment & Plan Note (Signed)
 Chronic, ongoing.  Denies SI/HI.  Continue Zoloft 200 MG daily and maintain Xanax as ordered, has been on for >6 years and does not want to change.  Continue Wellbutrin XL 300 MG daily, as is offering improvement to concentration and motivation + anhedonia.  Educated her on this medication, including use + side effects.  Discussed BLACK BOX warning and recommend she immediately alert PCP or go to ER if SI presents. Recommend return to therapy in future, which could offer benefit to mood.  UDS due 08/05/24 and controlled substance agreement up to date.

## 2023-11-15 NOTE — Progress Notes (Signed)
 BP 92/61   Pulse 77   Temp 98.2 F (36.8 C) (Oral)   Ht 5\' 3"  (1.6 m)   Wt 151 lb (68.5 kg)   LMP 10/26/2023 (Approximate)   SpO2 98%   BMI 26.75 kg/m    Subjective:    Patient ID: Stephanie Logan, female    DOB: 08-02-1991, 33 y.o.   MRN: [id number]  HPI: Stephanie Logan is a 33 y.o. female  Chief Complaint  Patient presents with   Anxiety   Depression   ANXIETY/STRESS Taking Zoloft 200 MG daily, Wellbutrin XL 300 MG, and Xanax as needed 0.5 MG PRN. Xanax she uses about once a week.  PDMP last fill 03/18/23.  Been on this regimen for 7 + years started by OB/GYN.  Pt is aware of risks of benzo medication use to include increased sedation, respiratory suppression, falls, dependence and cardiovascular events.  Pt would like to continue treatment as benefit determined to outweigh risk.  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  HISTORY: Has struggled with anxiety/depression her whole life, this worsened after 2nd child was born. Husband is double amputee, lost legs in [place].  Has history of stillborn child.  Duration:stable Anxious mood:  a little bit -- youngest one has pneumonia   Excessive worrying: no Irritability:  occasionally   Sweating: no Nausea: no Palpitations:no Hyperventilation: no Panic attacks: no Agoraphobia: no  Obscessions/compulsions: no Depressed mood:  occasionally    November 24, 2023    1:26 PM 08/06/2023    1:11 PM 01/04/2023    1:44 PM 12/02/2022    1:15 PM 11/04/2022    3:21 PM  Depression screen PHQ 2/9  Decreased Interest 1 1 1 1 1   Down, Depressed, Hopeless 1 1 1 1 1   PHQ - 2 Score 2 2 2 2 2   Altered sleeping 2 2 1 3 2   Tired, decreased energy 1 1 1 1 2   Change in appetite 1 1 1 2 2   Feeling bad or failure about yourself  1 2 0 1   Trouble concentrating 1 1 0 2 2  Moving slowly or fidgety/restless 0 0 0 0 0  Suicidal thoughts 0 0 0 0 0  PHQ-9 Score 8 9 5 11 10   Difficult doing work/chores Not  difficult at all  Not difficult at all Somewhat difficult Somewhat difficult  Anhedonia: no Weight changes: no Insomnia: yes hard to fall asleep -- Melatonin helps Hypersomnia: no Fatigue/loss of energy: a little bit, is a mom of 3 Feelings of worthlessness: no Feelings of guilt: no Impaired concentration/indecisiveness: no Suicidal ideations: no  Crying spells: no Recent Stressors/Life Changes: no   Relationship problems: no   Family stress: no     Financial stress: no    Job stress: no    Recent death/loss: no     24-Nov-2023    1:27 PM 08/06/2023    1:10 PM 01/04/2023    1:44 PM 12/02/2022    1:16 PM  GAD 7 : Generalized Anxiety Score  Nervous, Anxious, on Edge 1 1 1 1   Control/stop worrying 1 2 1 1   Worry too much - different things 1 2 1 1   Trouble relaxing 1 2  1   Restless 0 2 0 1  Easily annoyed or irritable 1 2 1 2   Afraid - awful might happen 1 2 2 3   Total GAD 7 Score 6 13  10   Anxiety Difficulty Somewhat difficult  Somewhat  difficult Somewhat difficult   Relevant past medical, surgical, family and social history reviewed and updated as indicated. Interim medical history since our last visit reviewed. Allergies and medications reviewed and updated.  Review of Systems  Constitutional:  Negative for activity change, appetite change, diaphoresis, fatigue and fever.  Respiratory:  Negative for cough, chest tightness, shortness of breath and wheezing.   Cardiovascular:  Negative for chest pain, palpitations and leg swelling.  Gastrointestinal: Negative.   Endocrine: Negative.   Neurological: Negative.   Psychiatric/Behavioral:  Positive for sleep disturbance. Negative for decreased concentration, self-injury and suicidal ideas. The patient is nervous/anxious.     Per HPI unless specifically indicated above     Objective:    BP 92/61   Pulse 77   Temp 98.2 F (36.8 C) (Oral)   Ht 5\' 3"  (1.6 m)   Wt 151 lb (68.5 kg)   LMP 10/26/2023 (Approximate)   SpO2 98%    BMI 26.75 kg/m   Wt Readings from Last 3 Encounters:  11/15/23 151 lb (68.5 kg)  08/06/23 149 lb (67.6 kg)  06/21/23 149 lb (67.6 kg)    Physical Exam Vitals and nursing note reviewed.  Constitutional:      General: She is awake. She is not in acute distress.    Appearance: She is well-developed and well-groomed. She is not ill-appearing or toxic-appearing.  HENT:     Head: Normocephalic.     Right Ear: Hearing and external ear normal.     Left Ear: Hearing and external ear normal.  Eyes:     General: Lids are normal.        Right eye: No discharge.        Left eye: No discharge.     Conjunctiva/sclera: Conjunctivae normal.     Pupils: Pupils are equal, round, and reactive to light.  Neck:     Thyroid: No thyromegaly.     Vascular: No carotid bruit.  Cardiovascular:     Rate and Rhythm: Normal rate and regular rhythm.     Heart sounds: Normal heart sounds. No murmur heard.    No gallop.  Pulmonary:     Effort: Pulmonary effort is normal. No accessory muscle usage or respiratory distress.     Breath sounds: Normal breath sounds.  Abdominal:     General: Bowel sounds are normal. There is no distension.     Palpations: Abdomen is soft.     Tenderness: There is no abdominal tenderness.  Musculoskeletal:     Cervical back: Normal range of motion and neck supple.     Right lower leg: No edema.     Left lower leg: No edema.  Lymphadenopathy:     Cervical: No cervical adenopathy.  Skin:    General: Skin is warm and dry.  Neurological:     Mental Status: She is alert and oriented to person, place, and time.     Deep Tendon Reflexes: Reflexes are normal and symmetric.     Reflex Scores:      Brachioradialis reflexes are 2+ on the right side and 2+ on the left side.      Patellar reflexes are 2+ on the right side and 2+ on the left side. Psychiatric:        Attention and Perception: Attention normal.        Mood and Affect: Mood normal.        Speech: Speech normal.         Behavior: Behavior normal. Behavior is  cooperative.        Thought Content: Thought content normal.    Results for orders placed or performed in visit on 08/06/23  [id number] 11+Oxyco+Alc+Crt-Bund   Collection Time: 08/06/23  1:12 PM  Result Value Ref Range   Ethanol Negative Cutoff=0.020 %   Amphetamines, Urine Negative Cutoff=1000 ng/mL   Barbiturate Negative Cutoff=200 ng/mL   BENZODIAZ UR QL Negative Cutoff=200 ng/mL   Cannabinoid Quant, Ur Negative Cutoff=50 ng/mL   Cocaine (Metabolite) Negative Cutoff=300 ng/mL   OPIATE SCREEN URINE Negative Cutoff=300 ng/mL   Oxycodone/Oxymorphone, Urine Negative Cutoff=300 ng/mL   Phencyclidine Negative Cutoff=25 ng/mL   Methadone Screen, Urine Negative Cutoff=300 ng/mL   Propoxyphene Negative Cutoff=300 ng/mL   Meperidine Negative Cutoff=200 ng/mL   Tramadol Negative Cutoff=200 ng/mL   Creatinine 19.8 (L) 20.0 - 300.0 mg/dL   pH, Urine 6.3 4.5 - 8.9  Specific Gravity   Collection Time: 08/06/23  1:12 PM  Result Value Ref Range   Specific Gravity 1.0034   CBC with Differential/Platelet   Collection Time: 08/06/23  1:30 PM  Result Value Ref Range   WBC 6.6 3.4 - 10.8 x10E3/uL   RBC 4.35 3.77 - 5.28 x10E6/uL   Hemoglobin 12.9 11.1 - 15.9 g/dL   Hematocrit 19.1 47.8 - 46.6 %   MCV 90 79 - 97 fL   MCH 29.7 26.6 - 33.0 pg   MCHC 32.8 31.5 - 35.7 g/dL   RDW 29.5 62.1 - 30.8 %   Platelets 278 150 - 450 x10E3/uL   Neutrophils 61 Not Estab. %   Lymphs 30 Not Estab. %   Monocytes 7 Not Estab. %   Eos 1 Not Estab. %   Basos 1 Not Estab. %   Neutrophils Absolute 4.0 1.4 - 7.0 x10E3/uL   Lymphocytes Absolute 2.0 0.7 - 3.1 x10E3/uL   Monocytes Absolute 0.5 0.1 - 0.9 x10E3/uL   EOS (ABSOLUTE) 0.1 0.0 - 0.4 x10E3/uL   Basophils Absolute 0.1 0.0 - 0.2 x10E3/uL   Immature Granulocytes 0 Not Estab. %   Immature Grans (Abs) 0.0 0.0 - 0.1 x10E3/uL  Comprehensive metabolic panel   Collection Time: 08/06/23  1:30 PM  Result Value Ref Range    Glucose 81 70 - 99 mg/dL   BUN 11 6 - 20 mg/dL   Creatinine, Ser 6.57 0.57 - 1.00 mg/dL   eGFR 846 >96 EX/BMW/4.13   BUN/Creatinine Ratio 14 9 - 23   Sodium 138 134 - 144 mmol/L   Potassium 4.1 3.5 - 5.2 mmol/L   Chloride 99 96 - 106 mmol/L   CO2 23 20 - 29 mmol/L   Calcium 9.7 8.7 - 10.2 mg/dL   Total Protein 7.2 6.0 - 8.5 g/dL   Albumin 4.8 3.9 - 4.9 g/dL   Globulin, Total 2.4 1.5 - 4.5 g/dL   Bilirubin Total 0.3 0.0 - 1.2 mg/dL   Alkaline Phosphatase 54 44 - 121 IU/L   AST 20 0 - 40 IU/L   ALT 11 0 - 32 IU/L  Lipid Panel w/o Chol/HDL Ratio   Collection Time: 08/06/23  1:30 PM  Result Value Ref Range   Cholesterol, Total 199 100 - 199 mg/dL   Triglycerides 99 0 - 149 mg/dL   HDL 65 >24 mg/dL   VLDL Cholesterol Cal 18 5 - 40 mg/dL   LDL Chol Calc (NIH) 401 (H) 0 - 99 mg/dL  TSH   Collection Time: 08/06/23  1:30 PM  Result Value Ref Range   TSH 0.981 0.450 - 4.500  uIU/mL  Vitamin B12   Collection Time: 08/06/23  1:30 PM  Result Value Ref Range   Vitamin B-12 360 232 - 1,245 pg/mL      Assessment & Plan:   Problem List Items Addressed This Visit       Other   Generalized anxiety disorder   Refer to depression plan of care for further.      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  Denies SI/HI.  Continue Zoloft 200 MG daily and maintain Xanax as ordered, has been on for >6 years and does not want to change.  Continue Wellbutrin XL 300 MG daily, as is offering improvement to concentration and motivation + anhedonia.  Educated her on this medication, including use + side effects.  Discussed BLACK BOX warning and recommend she immediately alert PCP or go to ER if SI presents. Recommend return to therapy in future, which could offer benefit to mood.  UDS due 08/05/24 and controlled substance agreement up to date.         Relevant  Medications   ALPRAZolam (XANAX) 0.5 MG tablet     Follow up plan: Return in about 38 weeks (around 08/07/2024) for Annual Physical -- with mood follow-up.

## 2023-11-24 ENCOUNTER — Telehealth: Admitting: Physician Assistant

## 2023-11-24 DIAGNOSIS — N390 Urinary tract infection, site not specified: Secondary | ICD-10-CM | POA: Diagnosis not present

## 2023-11-24 DIAGNOSIS — R3989 Other symptoms and signs involving the genitourinary system: Secondary | ICD-10-CM

## 2023-11-24 MED ORDER — CEPHALEXIN 500 MG PO CAPS: 500.0000 mg | ORAL_CAPSULE | Freq: Two times a day (BID) | ORAL | 0 refills | Status: AC

## 2023-11-24 NOTE — Progress Notes (Signed)

## 2023-11-24 NOTE — Progress Notes (Signed)
 I have spent 5 minutes in review of e-visit questionnaire, review and updating patient chart, medical decision making and response to patient.   Piedad Climes, PA-C

## 2023-12-28 ENCOUNTER — Encounter: Payer: Self-pay | Admitting: Nurse Practitioner

## 2024-03-06 ENCOUNTER — Encounter: Payer: Self-pay | Admitting: Nurse Practitioner

## 2024-03-07 ENCOUNTER — Ambulatory Visit: Admitting: Nurse Practitioner

## 2024-03-07 ENCOUNTER — Encounter: Payer: Self-pay | Admitting: Nurse Practitioner

## 2024-03-07 VITALS — BP 100/63 | HR 71 | Temp 98.7°F | Ht 63.0 in | Wt 149.0 lb

## 2024-03-07 DIAGNOSIS — F411 Generalized anxiety disorder: Secondary | ICD-10-CM

## 2024-03-07 DIAGNOSIS — F332 Major depressive disorder, recurrent severe without psychotic features: Secondary | ICD-10-CM

## 2024-03-07 MED ORDER — OLANZAPINE 5 MG PO TABS: 5.0000 mg | ORAL_TABLET | Freq: Every day | ORAL | 3 refills | Status: DC

## 2024-03-07 NOTE — Progress Notes (Signed)
 BP 100/63   Pulse 71   Temp 98.7 F (37.1 C) (Oral)   Ht 5' 3 (1.6 m)   Wt 149 lb (67.6 kg)   SpO2 97%   BMI 26.39 kg/m    Subjective:    Patient ID: Stephanie Logan, female    DOB: January 02, 1991, 33 y.o.   MRN: [id number]  HPI: Stephanie Logan is a 33 y.o. female  Chief Complaint  Patient presents with   Medical Management of Chronic Issues   DEPRESSION/ANXIETY Presents today for worsening depression, states anxiety has not been as bad.  Has been picking at skin on legs due to worsening mood.  Had to put her husband's service dog down in the middle of May. Has been off Wellbutrin  since March due to significant constipation with this.  Continues Zoloft  200 MG daily and Xanax  as needed 0.5 MG PRN. Xanax  she uses about once a week.  PDMP last fill 11/15/23.  Been on this regimen for 8 + years started by OB/GYN.  Pt is aware of risks of benzo medication use to include increased sedation, respiratory suppression, falls, dependence and cardiovascular events.  Pt would like to continue treatment as benefit determined to outweigh risk.     HISTORY: Has struggled with anxiety/depression her whole life, this worsened after 2nd child was born. Husband is double amputee, lost legs in [place].  Has history of stillborn child. In past has taken Prozac . Zoloft  has been her main medication for years now. Duration:uncontrolled Anxious mood: no  Excessive worrying: yes Irritability: yes  Sweating: no Nausea: no Palpitations:no Hyperventilation: no Panic attacks: no Agoraphobia: no  Obscessions/compulsions: no Depressed mood: yes    29-Mar-2024    2:30 PM 11/15/2023    1:26 PM 08/06/2023    1:11 PM 01/04/2023    1:44 PM 12/02/2022    1:15 PM  Depression screen PHQ 2/9  Decreased Interest 3 1 1 1 1   Down, Depressed, Hopeless 3 1 1 1 1   PHQ - 2 Score 6 2 2 2 2   Altered sleeping 3 2 2 1 3   Tired, decreased energy 3 1 1 1 1   Change in appetite 3 1 1 1 2   Feeling bad or failure about  yourself  3 1 2  0 1  Trouble concentrating 3 1 1  0 2  Moving slowly or fidgety/restless 0 0 0 0 0  Suicidal thoughts 2 0 0 0 0  PHQ-9 Score 23 8 9 5 11   Difficult doing work/chores Very difficult Not difficult at all  Not difficult at all Somewhat difficult  Anhedonia: yes Weight changes: no Insomnia: yes hard to fall asleep  Hypersomnia: no Fatigue/loss of energy: yes Feelings of worthlessness: yes Feelings of guilt: yes Impaired concentration/indecisiveness: yes Suicidal ideations: no ideation but at times feels it would be better if she could just stay in bed  Crying spells: yes Recent Stressors/Life Changes: yes   Relationship problems: no   Family stress: no     Financial stress: no    Job stress: no    Recent death/loss: yes     2024-03-29    2:30 PM 11/15/2023    1:27 PM 08/06/2023    1:10 PM 01/04/2023    1:44 PM  GAD 7 : Generalized Anxiety Score  Nervous, Anxious, on Edge 1 1 1 1   Control/stop worrying 1 1 2 1   Worry too much - different things 2 1 2 1   Trouble relaxing 1 1 2    Restless 1  0 2 0  Easily annoyed or irritable 3 1 2 1   Afraid - awful might happen 3 1 2 2   Total GAD 7 Score 12 6 13    Anxiety Difficulty Very difficult Somewhat difficult  Somewhat difficult   Relevant past medical, surgical, family and social history reviewed and updated as indicated. Interim medical history since our last visit reviewed. Allergies and medications reviewed and updated.  Review of Systems  Constitutional:  Negative for activity change, appetite change, diaphoresis, fatigue and fever.  Respiratory:  Negative for cough, chest tightness, shortness of breath and wheezing.   Cardiovascular:  Negative for chest pain, palpitations and leg swelling.  Gastrointestinal: Negative.   Endocrine: Negative.   Neurological: Negative.   Psychiatric/Behavioral:  Positive for sleep disturbance. Negative for decreased concentration, self-injury and suicidal ideas. The patient is  nervous/anxious (very mild).    Per HPI unless specifically indicated above     Objective:    BP 100/63   Pulse 71   Temp 98.7 F (37.1 C) (Oral)   Ht 5' 3 (1.6 m)   Wt 149 lb (67.6 kg)   SpO2 97%   BMI 26.39 kg/m   Wt Readings from Last 3 Encounters:  03/07/24 149 lb (67.6 kg)  11/15/23 151 lb (68.5 kg)  08/06/23 149 lb (67.6 kg)    Physical Exam Vitals and nursing note reviewed.  Constitutional:      General: She is awake. She is not in acute distress.    Appearance: She is well-developed and well-groomed. She is not ill-appearing or toxic-appearing.  HENT:     Head: Normocephalic.     Right Ear: Hearing and external ear normal.     Left Ear: Hearing and external ear normal.   Eyes:     General: Lids are normal.        Right eye: No discharge.        Left eye: No discharge.     Conjunctiva/sclera: Conjunctivae normal.     Pupils: Pupils are equal, round, and reactive to light.   Neck:     Thyroid : No thyromegaly.     Vascular: No carotid bruit.   Cardiovascular:     Rate and Rhythm: Normal rate and regular rhythm.     Heart sounds: Normal heart sounds. No murmur heard.    No gallop.  Pulmonary:     Effort: Pulmonary effort is normal. No accessory muscle usage or respiratory distress.     Breath sounds: Normal breath sounds.  Abdominal:     General: Bowel sounds are normal. There is no distension.     Palpations: Abdomen is soft.     Tenderness: There is no abdominal tenderness.   Musculoskeletal:     Cervical back: Normal range of motion and neck supple.     Right lower leg: No edema.     Left lower leg: No edema.  Lymphadenopathy:     Cervical: No cervical adenopathy.   Skin:    General: Skin is warm and dry.   Neurological:     Mental Status: She is alert and oriented to person, place, and time.     Deep Tendon Reflexes: Reflexes are normal and symmetric.     Reflex Scores:      Brachioradialis reflexes are 2+ on the right side and 2+ on the  left side.      Patellar reflexes are 2+ on the right side and 2+ on the left side.  Psychiatric:  Attention and Perception: Attention normal.        Mood and Affect: Mood normal. Affect is tearful (a little tearfulness).        Speech: Speech normal.        Behavior: Behavior normal. Behavior is cooperative.        Thought Content: Thought content normal. Thought content does not include homicidal or suicidal ideation. Thought content does not include suicidal plan.     Results for orders placed or performed in visit on 08/06/23  [id number] 11+Oxyco+Alc+Crt-Bund   Collection Time: 08/06/23  1:12 PM  Result Value Ref Range   Ethanol Negative Cutoff=0.020 %   Amphetamines, Urine Negative Cutoff=1000 ng/mL   Barbiturate Negative Cutoff=200 ng/mL   BENZODIAZ UR QL Negative Cutoff=200 ng/mL   Cannabinoid Quant, Ur Negative Cutoff=50 ng/mL   Cocaine (Metabolite) Negative Cutoff=300 ng/mL   OPIATE SCREEN URINE Negative Cutoff=300 ng/mL   Oxycodone /Oxymorphone, Urine Negative Cutoff=300 ng/mL   Phencyclidine Negative Cutoff=25 ng/mL   Methadone Screen, Urine Negative Cutoff=300 ng/mL   Propoxyphene Negative Cutoff=300 ng/mL   Meperidine Negative Cutoff=200 ng/mL   Tramadol  Negative Cutoff=200 ng/mL   Creatinine 19.8 (L) 20.0 - 300.0 mg/dL   pH, Urine 6.3 4.5 - 8.9  Specific Gravity   Collection Time: 08/06/23  1:12 PM  Result Value Ref Range   Specific Gravity 1.0034   CBC with Differential/Platelet   Collection Time: 08/06/23  1:30 PM  Result Value Ref Range   WBC 6.6 3.4 - 10.8 x10E3/uL   RBC 4.35 3.77 - 5.28 x10E6/uL   Hemoglobin 12.9 11.1 - 15.9 g/dL   Hematocrit 29.5 62.1 - 46.6 %   MCV 90 79 - 97 fL   MCH 29.7 26.6 - 33.0 pg   MCHC 32.8 31.5 - 35.7 g/dL   RDW 30.8 65.7 - 84.6 %   Platelets 278 150 - 450 x10E3/uL   Neutrophils 61 Not Estab. %   Lymphs 30 Not Estab. %   Monocytes 7 Not Estab. %   Eos 1 Not Estab. %   Basos 1 Not Estab. %   Neutrophils Absolute 4.0  1.4 - 7.0 x10E3/uL   Lymphocytes Absolute 2.0 0.7 - 3.1 x10E3/uL   Monocytes Absolute 0.5 0.1 - 0.9 x10E3/uL   EOS (ABSOLUTE) 0.1 0.0 - 0.4 x10E3/uL   Basophils Absolute 0.1 0.0 - 0.2 x10E3/uL   Immature Granulocytes 0 Not Estab. %   Immature Grans (Abs) 0.0 0.0 - 0.1 x10E3/uL  Comprehensive metabolic panel   Collection Time: 08/06/23  1:30 PM  Result Value Ref Range   Glucose 81 70 - 99 mg/dL   BUN 11 6 - 20 mg/dL   Creatinine, Ser 9.62 0.57 - 1.00 mg/dL   eGFR 952 >84 XL/KGM/0.10   BUN/Creatinine Ratio 14 9 - 23   Sodium 138 134 - 144 mmol/L   Potassium 4.1 3.5 - 5.2 mmol/L   Chloride 99 96 - 106 mmol/L   CO2 23 20 - 29 mmol/L   Calcium  9.7 8.7 - 10.2 mg/dL   Total Protein 7.2 6.0 - 8.5 g/dL   Albumin 4.8 3.9 - 4.9 g/dL   Globulin, Total 2.4 1.5 - 4.5 g/dL   Bilirubin Total 0.3 0.0 - 1.2 mg/dL   Alkaline Phosphatase 54 44 - 121 IU/L   AST 20 0 - 40 IU/L   ALT 11 0 - 32 IU/L  Lipid Panel w/o Chol/HDL Ratio   Collection Time: 08/06/23  1:30 PM  Result Value Ref Range   Cholesterol, Total 199  100 - 199 mg/dL   Triglycerides 99 0 - 149 mg/dL   HDL 65 >16 mg/dL   VLDL Cholesterol Cal 18 5 - 40 mg/dL   LDL Chol Calc (NIH) 109 (H) 0 - 99 mg/dL  TSH   Collection Time: 08/06/23  1:30 PM  Result Value Ref Range   TSH 0.981 0.450 - 4.500 uIU/mL  Vitamin B12   Collection Time: 08/06/23  1:30 PM  Result Value Ref Range   Vitamin B-12 360 232 - 1,245 pg/mL      Assessment & Plan:   Problem List Items Addressed This Visit       Other   Severe episode of recurrent major depressive disorder, 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  Denies SI/HI today.  Continue Zoloft  200 MG daily and maintain Xanax  as ordered, has been on for >8 years and does not want to change.  Constipation with Wellbutrin  and has been off since March. Recommend return to therapy in future, which could offer benefit to mood.  UDS due 08/05/24 and controlled substance agreement up to  date.   - Discussed with her options: could slowly reduce Zoloft  and change to SNRI like Effexor OR could trial a medication like Olanzapine at night, educated on both of these plans and medication.  Would benefit Rexulti but does not appear to be covered.  She prefers not to stop Zoloft .  Will trial low dose of Olanzapine 5 MG at night.  Educated her on this medication, including use + side effects.  Discussed BLACK BOX warning and recommend she immediately alert PCP or go to ER if SI presents.  - Urgent referral to psychiatry, discussed with patient.  Would benefit further recommendations and possible return to therapy with them.       Relevant Orders   FSH/LH   TSH   Vitamin B12   Ambulatory referral to Psychiatry   Generalized anxiety disorder   Refer to depression plan of care for further.       Time: 25 minutes, >50% spent counseling/or care coordination   Follow up plan: Return in about 2 weeks (around 03/21/2024) for Depression.

## 2024-03-07 NOTE — Assessment & Plan Note (Signed)
 Chronic, exacerbated.  Denies SI/HI today.  Continue Zoloft  200 MG daily and maintain Xanax  as ordered, has been on for >8 years and does not want to change.  Constipation with Wellbutrin  and has been off since March. Recommend return to therapy in future, which could offer benefit to mood.  UDS due 08/05/24 and controlled substance agreement up to date.   - Discussed with her options: could slowly reduce Zoloft  and change to SNRI like Effexor OR could trial a medication like Olanzapine at night, educated on both of these plans and medication.  Would benefit Rexulti but does not appear to be covered.  She prefers not to stop Zoloft .  Will trial low dose of Olanzapine 5 MG at night.  Educated her on this medication, including use + side effects.  Discussed BLACK BOX warning and recommend she immediately alert PCP or go to ER if SI presents.  - Urgent referral to psychiatry, discussed with patient.  Would benefit further recommendations and possible return to therapy with them.

## 2024-03-07 NOTE — Assessment & Plan Note (Signed)
Refer to depression plan of care for further. 

## 2024-03-07 NOTE — Patient Instructions (Signed)
 Managing Depression, Adult Depression is a mental health condition that affects your thoughts, feelings, and actions. Being diagnosed with depression can bring you relief if you did not know why you have felt or behaved a certain way. It could also leave you feeling overwhelmed. Finding ways to manage your symptoms can help you feel more positive about your future. How to manage lifestyle changes Being depressed is difficult. Depression can increase the level of everyday stress. Stress can make depression symptoms worse. You may believe your symptoms cannot be managed or will never improve. However, there are many things you can try to help manage your symptoms. There is hope. Managing stress  Stress is your body's reaction to life changes and events, both good and bad. Stress can add to your feelings of depression. Learning to manage your stress can help lessen your feelings of depression. Try some of the following approaches to reducing your stress (stress reduction techniques): Listen to music that you enjoy and that inspires you. Try using a meditation app or take a meditation class. Develop a practice that helps you connect with your spiritual self. Walk in nature, pray, or go to a place of worship. Practice deep breathing. To do this, inhale slowly through your nose. Pause at the top of your inhale for a few seconds and then exhale slowly, letting yourself relax. Repeat this three or four times. Practice yoga to help relax and work your muscles. Choose a stress reduction technique that works for you. These techniques take time and practice to develop. Set aside 5-15 minutes a day to do them. Therapists can offer training in these techniques. Do these things to help manage stress: Keep a journal. Know your limits. Set healthy boundaries for yourself and others, such as saying "no" when you think something is too much. Pay attention to how you react to certain situations. You may not be able to  control everything, but you can change your reaction. Add humor to your life by watching funny movies or shows. Make time for activities that you enjoy and that relax you. Spend less time using electronics, especially at night before bed. The light from screens can make your brain think it is time to get up rather than go to bed.  Medicines Medicines, such as antidepressants, are often a part of treatment for depression. Talk with your pharmacist or health care provider about all the medicines, supplements, and herbal products that you take, their possible side effects, and what medicines and other products are safe to take together. Make sure to report any side effects you may have to your health care provider. Relationships Your health care provider may suggest family therapy, couples therapy, or individual therapy as part of your treatment. How to recognize changes Everyone responds differently to treatment for depression. As you recover from depression, you may start to: Have more interest in doing activities. Feel more hopeful. Have more energy. Eat a more regular amount of food. Have better mental focus. It is important to recognize if your depression is not getting better or is getting worse. The symptoms you had in the beginning may return, such as: Feeling tired. Eating too much or too little. Sleeping too much or too little. Feeling restless, agitated, or hopeless. Trouble focusing or making decisions. Having unexplained aches and pains. Feeling irritable, angry, or aggressive. If you or your family members notice these symptoms coming back, let your health care provider know right away. Follow these instructions at home: Activity Try to  get some form of exercise each day, such as walking. Try yoga, mindfulness, or other stress reduction techniques. Participate in group activities if you are able. Lifestyle Get enough sleep. Cut down on or stop using caffeine, tobacco,  alcohol, and any other harmful substances. Eat a healthy diet that includes plenty of vegetables, fruits, whole grains, low-fat dairy products, and lean protein. Limit foods that are high in solid fats, added sugar, or salt (sodium). General instructions Take over-the-counter and prescription medicines only as told by your health care provider. Keep all follow-up visits. It is important for your health care provider to check on your mood, behavior, and medicines. Your health care provider may need to make changes to your treatment. Where to find support Talking to others  Friends and family members can be sources of support and guidance. Talk to trusted friends or family members about your condition. Explain your symptoms and let them know that you are working with a health care provider to treat your depression. Tell friends and family how they can help. Finances Find mental health providers that fit with your financial situation. Talk with your health care provider if you are worried about access to food, housing, or medicine. Call your insurance company to learn about your co-pays and prescription plan. Where to find more information You can find support in your area from: Anxiety and Depression Association of America (ADAA): adaa.org Mental Health America: mentalhealthamerica.net [employer] on Mental Illness: nami.org Contact a health care provider if: You stop taking your antidepressant medicines, and you have any of these symptoms: Nausea. Headache. Light-headedness. Chills and body aches. Not being able to sleep (insomnia). You or your friends and family think your depression is getting worse. Get help right away if: You have thoughts of hurting yourself or others. Get help right away if you feel like you may hurt yourself or others, or have thoughts about taking your own life. Go to your nearest emergency room or: Call 911. Call the National Suicide Prevention Lifeline at  360-042-0264 or 988. This is open 24 hours a day. Text the Crisis Text Line at 952-302-5572. This information is not intended to replace advice given to you by your health care provider. Make sure you discuss any questions you have with your health care provider. Document Revised: 01/13/2022 Document Reviewed: 01/13/2022 Elsevier Patient Education  2024 [employer].

## 2024-03-08 ENCOUNTER — Ambulatory Visit: Payer: Self-pay | Admitting: Nurse Practitioner

## 2024-03-08 LAB — FSH/LH
FSH: 2.7 m[IU]/mL
LH: 7.1 m[IU]/mL

## 2024-03-08 LAB — VITAMIN B12: Vitamin B-12: 470 pg/mL (ref 232–1245)

## 2024-03-08 LAB — TSH: TSH: 1.77 u[IU]/mL (ref 0.450–4.500)

## 2024-03-18 NOTE — Patient Instructions (Signed)
 Be Involved in Caring For Your Health:  Taking Medications When medications are taken as directed, they can greatly improve your health. But if they are not taken as prescribed, they may not work. In some cases, not taking them correctly can be harmful. To help ensure your treatment remains effective and safe, understand your medications and how to take them. Bring your medications to each visit for review by your provider.  Your lab results, notes, and after visit summary will be available on My Chart. We strongly encourage you to use this feature. If lab results are abnormal the clinic will contact you with the appropriate steps. If the clinic does not contact you assume the results are satisfactory. You can always view your results on My Chart. If you have questions regarding your health or results, please contact the clinic during office hours. You can also ask questions on My Chart.  We at [facility] are grateful that you chose [place] to provide your care. We strive to provide evidence-based and compassionate care and are always looking for feedback. If you get a survey from the clinic please complete this so we can hear your opinions.  Managing Depression, Adult Depression is a mental health condition that affects your thoughts, feelings, and actions. Being diagnosed with depression can bring you relief if you did not know why you have felt or behaved a certain way. It could also leave you feeling overwhelmed. Finding ways to manage your symptoms can help you feel more positive about your future. How to manage lifestyle changes Being depressed is difficult. Depression can increase the level of everyday stress. Stress can make depression symptoms worse. You may believe your symptoms cannot be managed or will never improve. However, there are many things you can try to help manage your symptoms. There is hope. Managing stress  Stress is your body's reaction to life changes and events,  both good and bad. Stress can add to your feelings of depression. Learning to manage your stress can help lessen your feelings of depression. Try some of the following approaches to reducing your stress (stress reduction techniques): Listen to music that you enjoy and that inspires you. Try using a meditation app or take a meditation class. Develop a practice that helps you connect with your spiritual self. Walk in nature, pray, or go to a place of worship. Practice deep breathing. To do this, inhale slowly through your nose. Pause at the top of your inhale for a few seconds and then exhale slowly, letting yourself relax. Repeat this three or four times. Practice yoga to help relax and work your muscles. Choose a stress reduction technique that works for you. These techniques take time and practice to develop. Set aside 5-15 minutes a day to do them. Therapists can offer training in these techniques. Do these things to help manage stress: Keep a journal. Know your limits. Set healthy boundaries for yourself and others, such as saying "no" when you think something is too much. Pay attention to how you react to certain situations. You may not be able to control everything, but you can change your reaction. Add humor to your life by watching funny movies or shows. Make time for activities that you enjoy and that relax you. Spend less time using electronics, especially at night before bed. The light from screens can make your brain think it is time to get up rather than go to bed.  Medicines Medicines, such as antidepressants, are often a part of  treatment for depression. Talk with your pharmacist or health care provider about all the medicines, supplements, and herbal products that you take, their possible side effects, and what medicines and other products are safe to take together. Make sure to report any side effects you may have to your health care provider. Relationships Your health care  provider may suggest family therapy, couples therapy, or individual therapy as part of your treatment. How to recognize changes Everyone responds differently to treatment for depression. As you recover from depression, you may start to: Have more interest in doing activities. Feel more hopeful. Have more energy. Eat a more regular amount of food. Have better mental focus. It is important to recognize if your depression is not getting better or is getting worse. The symptoms you had in the beginning may return, such as: Feeling tired. Eating too much or too little. Sleeping too much or too little. Feeling restless, agitated, or hopeless. Trouble focusing or making decisions. Having unexplained aches and pains. Feeling irritable, angry, or aggressive. If you or your family members notice these symptoms coming back, let your health care provider know right away. Follow these instructions at home: Activity Try to get some form of exercise each day, such as walking. Try yoga, mindfulness, or other stress reduction techniques. Participate in group activities if you are able. Lifestyle Get enough sleep. Cut down on or stop using caffeine, tobacco, alcohol, and any other harmful substances. Eat a healthy diet that includes plenty of vegetables, fruits, whole grains, low-fat dairy products, and lean protein. Limit foods that are high in solid fats, added sugar, or salt (sodium). General instructions Take over-the-counter and prescription medicines only as told by your health care provider. Keep all follow-up visits. It is important for your health care provider to check on your mood, behavior, and medicines. Your health care provider may need to make changes to your treatment. Where to find support Talking to others  Friends and family members can be sources of support and guidance. Talk to trusted friends or family members about your condition. Explain your symptoms and let them know that you  are working with a health care provider to treat your depression. Tell friends and family how they can help. Finances Find mental health providers that fit with your financial situation. Talk with your health care provider if you are worried about access to food, housing, or medicine. Call your insurance company to learn about your co-pays and prescription plan. Where to find more information You can find support in your area from: Anxiety and Depression Association of America (ADAA): adaa.org Mental Health America: mentalhealthamerica.net [employer] on Mental Illness: nami.org Contact a health care provider if: You stop taking your antidepressant medicines, and you have any of these symptoms: Nausea. Headache. Light-headedness. Chills and body aches. Not being able to sleep (insomnia). You or your friends and family think your depression is getting worse. Get help right away if: You have thoughts of hurting yourself or others. Get help right away if you feel like you may hurt yourself or others, or have thoughts about taking your own life. Go to your nearest emergency room or: Call 911. Call the National Suicide Prevention Lifeline at 315-804-2514 or 988. This is open 24 hours a day. Text the Crisis Text Line at 902-341-0291. This information is not intended to replace advice given to you by your health care provider. Make sure you discuss any questions you have with your health care provider. Document Revised: 01/13/2022 Document Reviewed:  01/13/2022 Elsevier Patient Education  2024 [employer].

## 2024-03-22 ENCOUNTER — Encounter: Payer: Self-pay | Admitting: Nurse Practitioner

## 2024-03-22 ENCOUNTER — Telehealth (INDEPENDENT_AMBULATORY_CARE_PROVIDER_SITE_OTHER): Admitting: Nurse Practitioner

## 2024-03-22 DIAGNOSIS — Z79899 Other long term (current) drug therapy: Secondary | ICD-10-CM | POA: Diagnosis not present

## 2024-03-22 DIAGNOSIS — F411 Generalized anxiety disorder: Secondary | ICD-10-CM

## 2024-03-22 DIAGNOSIS — F332 Major depressive disorder, recurrent severe without psychotic features: Secondary | ICD-10-CM | POA: Diagnosis not present

## 2024-03-22 MED ORDER — OLANZAPINE 5 MG PO TABS: 5.0000 mg | ORAL_TABLET | Freq: Every day | ORAL | 2 refills | Status: DC

## 2024-03-22 NOTE — Assessment & Plan Note (Signed)
Refer to depression plan of care for further. 

## 2024-03-22 NOTE — Assessment & Plan Note (Signed)
 Educated at length on risks of long term use -- refer to anxiety plan.  UDS due next 08/05/24 and controlled subs contract up to date July 2022.

## 2024-03-22 NOTE — Assessment & Plan Note (Signed)
 Chronic and improved.  Denies SI/HI today. Continue Zoloft  200 MG daily, Zyprexa , and maintain Xanax  as ordered, has been on for >8 years and does not want to change.  Constipation with Wellbutrin .  Recommend return to therapy in future, which could offer benefit to mood.  UDS due 08/05/24 and controlled substance agreement up to date.   - Zyprexa  has offered her a lot of benefit, will continue this and send in refills.  Follow-up with her after she sees psychiatry.

## 2024-03-22 NOTE — Progress Notes (Signed)
 There were no vitals taken for this visit.   Subjective:    Patient ID: Stephanie Logan, female    DOB: [birth year], 33 y.o.   MRN: [id number]  HPI: Stephanie Logan is a 33 y.o. female  Chief Complaint  Patient presents with   Depression   Virtual Visit via Video Note  I connected with MARTHELLA OSORNO on 03/22/24 at  2:40 PM EDT by a video enabled telemedicine application and verified that I am speaking with the correct person using two identifiers.  Location: Patient: home Provider: work   I discussed the limitations of evaluation and management by telemedicine and the availability of in person appointments. The patient expressed understanding and agreed to proceed.  I discussed the assessment and treatment plan with the patient. The patient was provided an opportunity to ask questions and all were answered. The patient agreed with the plan and demonstrated an understanding of the instructions.   The patient was advised to call back or seek an in-person evaluation if the symptoms worsen or if the condition fails to improve as anticipated.  I provided 25 minutes of non-face-to-face time during this encounter.   Akiba Melfi T Durrell Barajas, NP   DEPRESSION Follow-up today for mood, we added on Olanzapine  on 03/07/24 and placed referral to psychiatry, she has noticed a huge difference with Olanzapine . Is scheduled for July with psychiatry. Continues Zoloft  200 MG daily and Xanax  as needed 0.5 MG PRN. Xanax  she uses about once a week.  PDMP last fill 11/15/23. Been on this regimen for 8 + years started by OB/GYN.  Pt is aware of risks of benzo medication use to include increased sedation, respiratory suppression, falls, dependence and cardiovascular events.  Pt would like to continue treatment as benefit determined to outweigh risk.  She self stopped Wellbutrin  in March 2025.   HISTORY: Has struggled with anxiety/depression her whole life, this worsened after 2nd child was born. Husband is  double amputee, lost legs in [place].  Has history of stillborn child. In past has taken Prozac . Zoloft  has been her main medication for years now. Mood status: improved  Satisfied with current treatment?: yes Symptom severity: moderate  Duration of current treatment : chronic Side effects: no Medication compliance: good compliance Psychotherapy/counseling: yes in the past Previous psychiatric medications: multiple medications Depressed mood: no Anxious mood: no Anhedonia: no Significant weight loss or gain: no Insomnia: sleeping a lot better, better than she ever has Fatigue: no Feelings of worthlessness or guilt: no Impaired concentration/indecisiveness: no Suicidal ideations: no Hopelessness: no Crying spells: no    03/22/2024    2:41 PM 03/07/2024    2:30 PM 11/15/2023    1:26 PM 08/06/2023    1:11 PM 01/04/2023    1:44 PM  Depression screen PHQ 2/9  Decreased Interest 0 3 1 1 1   Down, Depressed, Hopeless 1 3 1 1 1   PHQ - 2 Score 1 6 2 2 2   Altered sleeping 1 3 2 2 1   Tired, decreased energy 0 3 1 1 1   Change in appetite 1 3 1 1 1   Feeling bad or failure about yourself  0 3 1 2  0  Trouble concentrating 1 3 1 1  0  Moving slowly or fidgety/restless 0 0 0 0 0  Suicidal thoughts 0 2 0 0 0  PHQ-9 Score 4 23 8 9 5   Difficult doing work/chores Not difficult at all Very difficult Not difficult at all  Not difficult at all  03/22/2024    2:42 PM 03/07/2024    2:30 PM 11/15/2023    1:27 PM 08/06/2023    1:10 PM  GAD 7 : Generalized Anxiety Score  Nervous, Anxious, on Edge 0 1 1 1   Control/stop worrying 0 1 1 2   Worry too much - different things 1 2 1 2   Trouble relaxing 0 1 1 2   Restless 1 1 0 2  Easily annoyed or irritable 1 3 1 2   Afraid - awful might happen 1 3 1 2   Total GAD 7 Score 4 12 6 13   Anxiety Difficulty Somewhat difficult Very difficult Somewhat difficult    Relevant past medical, surgical, family and social history reviewed and updated as indicated.  Interim medical history since our last visit reviewed. Allergies and medications reviewed and updated.  Review of Systems  Constitutional:  Negative for activity change, appetite change, diaphoresis, fatigue and fever.  Respiratory:  Negative for cough, chest tightness, shortness of breath and wheezing.   Cardiovascular:  Negative for chest pain, palpitations and leg swelling.  Gastrointestinal: Negative.   Endocrine: Negative.   Neurological: Negative.   Psychiatric/Behavioral:  Negative for decreased concentration, self-injury, sleep disturbance and suicidal ideas. The patient is not nervous/anxious.     Per HPI unless specifically indicated above     Objective:    There were no vitals taken for this visit.  Wt Readings from Last 3 Encounters:  03/07/24 149 lb (67.6 kg)  11/15/23 151 lb (68.5 kg)  08/06/23 149 lb (67.6 kg)    Physical Exam Vitals and nursing note reviewed.  Constitutional:      General: She is awake. She is not in acute distress.    Appearance: She is well-developed. She is not ill-appearing.  HENT:     Head: Normocephalic.     Right Ear: Hearing normal.     Left Ear: Hearing normal.  Eyes:     General: Lids are normal.        Right eye: No discharge.        Left eye: No discharge.     Conjunctiva/sclera: Conjunctivae normal.  Pulmonary:     Effort: Pulmonary effort is normal. No accessory muscle usage or respiratory distress.  Musculoskeletal:     Cervical back: Normal range of motion.  Neurological:     Mental Status: She is alert and oriented to person, place, and time.  Psychiatric:        Attention and Perception: Attention normal.        Mood and Affect: Mood normal.        Behavior: Behavior normal. Behavior is cooperative.        Thought Content: Thought content normal.        Judgment: Judgment normal.    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  Denies SI/HI today. Continue Zoloft  200 MG daily, Zyprexa , and maintain Xanax  as ordered, has been on for >8 years and does not want to change.  Constipation with Wellbutrin .  Recommend return to therapy in future, which could offer benefit to mood.  UDS due 08/05/24 and controlled substance agreement up to date.   - Zyprexa  has offered her a lot of benefit, will continue this and send in refills.  Follow-up with her after she sees psychiatry.       Long term prescription benzodiazepine use   Educated at length on risks of long term use -- refer to anxiety plan.  UDS due next 08/05/24 and controlled subs contract up to date July 2022.      Generalized anxiety disorder   Refer to depression plan of care for further.        Follow up plan: Return in about 8 weeks (around 05/15/2024) for Depression -- virtual.

## 2024-03-23 NOTE — Progress Notes (Signed)
 Called patient and left message for to call back and get scheduled for the end of August

## 2024-06-27 ENCOUNTER — Ambulatory Visit: Admitting: Nurse Practitioner

## 2024-06-28 ENCOUNTER — Encounter: Payer: Self-pay | Admitting: Nurse Practitioner

## 2024-06-28 ENCOUNTER — Ambulatory Visit: Admitting: Nurse Practitioner

## 2024-06-28 VITALS — BP 99/63 | HR 71 | Temp 98.5°F | Resp 15 | Ht 62.99 in | Wt 160.0 lb

## 2024-06-28 DIAGNOSIS — R10A1 Flank pain, right side: Secondary | ICD-10-CM | POA: Diagnosis not present

## 2024-06-28 DIAGNOSIS — F411 Generalized anxiety disorder: Secondary | ICD-10-CM | POA: Diagnosis not present

## 2024-06-28 DIAGNOSIS — F332 Major depressive disorder, recurrent severe without psychotic features: Secondary | ICD-10-CM

## 2024-06-28 MED ORDER — TAMSULOSIN HCL 0.4 MG PO CAPS: 0.4000 mg | ORAL_CAPSULE | Freq: Every day | ORAL | 0 refills | Status: AC

## 2024-06-28 MED ORDER — CYCLOBENZAPRINE HCL 10 MG PO TABS: 10.0000 mg | ORAL_TABLET | Freq: Three times a day (TID) | ORAL | 0 refills | Status: DC | PRN

## 2024-06-28 NOTE — Assessment & Plan Note (Signed)
Refer to depression plan of care for further. 

## 2024-06-28 NOTE — Assessment & Plan Note (Signed)
 Acute on and off for one month with history of kidney stones, last in 2001.  UA today overall negative, as is wet prep.  However, due to symptoms and history will start Flomax  daily for 14 days and Flexeril  to take as needed for pain, discussed with her not to take if driving or working.  Educated her on this plan of care. Ensure good hydration and avoid foods/drinks that can place at higher risk for stones. Plan for return in 2 weeks for follow-up, sooner if worsening pain.  Will get imaging if ongoing.

## 2024-06-28 NOTE — Progress Notes (Signed)
 BP 99/63 (BP Location: Left Arm, Patient Position: Sitting, Cuff Size: Normal)   Pulse 71   Temp 98.5 F (36.9 C) (Oral)   Resp 15   Ht 5' 2.99 (1.6 m)   Wt 160 lb (72.6 kg)   LMP 05/22/2024 (Approximate)   SpO2 98%   BMI 28.35 kg/m    Subjective:    Patient ID: Stephanie Logan, female    DOB: 1990-12-07, 33 y.o.   MRN: [id number]  HPI: BRISTAL STEFFY is a 33 y.o. female  Chief Complaint  Patient presents with   Flank Pain    Ongoing over the last month. Has had previous kidney stones in the past. Pain can be mild to executing.    URINARY SYMPTOMS/FLANK PAIN Has had flank pain on and off since beginning of September. On Monday the pain was very intense.  Is always kind of achy there, but not always intense.  History of kidney stones, lithotripsy done in 2001.  Occasional aches here and there since then, but not like current pains.  At worst pain is 10/10.  Nothing makes pain better. Lying on right side makes it worse.   Dysuria: no Urinary frequency: no Urgency: no Small volume voids: no Symptom severity: no Urinary incontinence: no Foul odor: no Hematuria: no Abdominal pain: no Back pain: yes Suprapubic pain/pressure: no Flank pain: yes -- more to right side Fever:  no Vomiting: no Status: stable Previous urinary tract infection: yes Recurrent urinary tract infection: no Sexual activity: monogamous History of sexually transmitted disease: no Treatments attempted: Tylenol  and lemon water    Following with [facility] for psychiatry.  Currently waiting for therapist to start therapy, they have referral in for this.    06/28/2024    4:23 PM 03/22/2024    2:41 PM 03/07/2024    2:30 PM 11/15/2023    1:26 PM 08/06/2023    1:11 PM  Depression screen PHQ 2/9  Decreased Interest 1 0 3 1 1   Down, Depressed, Hopeless 2 1 3 1 1   PHQ - 2 Score 3 1 6 2 2   Altered sleeping 3 1 3 2 2   Tired, decreased energy 2 0 3 1 1   Change in appetite 1 1 3 1 1   Feeling bad or  failure about yourself  1 0 3 1 2   Trouble concentrating 2 1 3 1 1   Moving slowly or fidgety/restless 0 0 0 0 0  Suicidal thoughts 0 0 2 0 0  PHQ-9 Score 12 4 23 8 9   Difficult doing work/chores Somewhat difficult Not difficult at all Very difficult Not difficult at all        06/28/2024    4:23 PM 03/22/2024    2:42 PM 03/07/2024    2:30 PM 11/15/2023    1:27 PM  GAD 7 : Generalized Anxiety Score  Nervous, Anxious, on Edge 2 0 1 1  Control/stop worrying 1 0 1 1  Worry too much - different things 1 1 2 1   Trouble relaxing 2 0 1 1  Restless 2 1 1  0  Easily annoyed or irritable 2 1 3 1   Afraid - awful might happen 2 1 3 1   Total GAD 7 Score 12 4 12 6   Anxiety Difficulty Somewhat difficult Somewhat difficult Very difficult Somewhat difficult   Relevant past medical, surgical, family and social history reviewed and updated as indicated. Interim medical history since our last visit reviewed. Allergies and medications reviewed and updated.  Review of Systems  Constitutional:  Negative for activity change, appetite change, diaphoresis, fatigue and fever.  Respiratory:  Negative for cough, chest tightness, shortness of breath and wheezing.   Cardiovascular:  Negative for chest pain, palpitations and leg swelling.  Gastrointestinal:  Positive for abdominal pain. Negative for abdominal distention, constipation, diarrhea, nausea and vomiting.  Neurological: Negative.   Psychiatric/Behavioral:  Negative for decreased concentration, self-injury, sleep disturbance and suicidal ideas. The patient is nervous/anxious.     Per HPI unless specifically indicated above     Objective:    BP 99/63 (BP Location: Left Arm, Patient Position: Sitting, Cuff Size: Normal)   Pulse 71   Temp 98.5 F (36.9 C) (Oral)   Resp 15   Ht 5' 2.99 (1.6 m)   Wt 160 lb (72.6 kg)   LMP 05/22/2024 (Approximate)   SpO2 98%   BMI 28.35 kg/m   Wt Readings from Last 3 Encounters:  06/28/24 160 lb (72.6 kg)   03/07/24 149 lb (67.6 kg)  11/15/23 151 lb (68.5 kg)    Physical Exam Vitals and nursing note reviewed.  Constitutional:      General: She is awake. She is not in acute distress.    Appearance: She is well-developed and well-groomed. She is not ill-appearing or toxic-appearing.  HENT:     Head: Normocephalic.     Right Ear: Hearing and external ear normal.     Left Ear: Hearing and external ear normal.  Eyes:     General: Lids are normal.        Right eye: No discharge.        Left eye: No discharge.     Conjunctiva/sclera: Conjunctivae normal.     Pupils: Pupils are equal, round, and reactive to light.  Neck:     Thyroid : No thyromegaly.     Vascular: No carotid bruit.  Cardiovascular:     Rate and Rhythm: Normal rate and regular rhythm.     Heart sounds: Normal heart sounds. No murmur heard.    No gallop.  Pulmonary:     Effort: Pulmonary effort is normal. No accessory muscle usage or respiratory distress.     Breath sounds: Normal breath sounds.  Abdominal:     General: Bowel sounds are normal. There is no distension.     Palpations: Abdomen is soft.     Tenderness: There is abdominal tenderness in the right lower quadrant. There is right CVA tenderness (mild). There is no left CVA tenderness, guarding or rebound.     Hernia: No hernia is present.  Musculoskeletal:     Cervical back: Normal range of motion and neck supple.     Right lower leg: No edema.     Left lower leg: No edema.  Lymphadenopathy:     Cervical: No cervical adenopathy.  Skin:    General: Skin is warm and dry.  Neurological:     Mental Status: She is alert and oriented to person, place, and time.     Deep Tendon Reflexes: Reflexes are normal and symmetric.     Reflex Scores:      Brachioradialis reflexes are 2+ on the right side and 2+ on the left side.      Patellar reflexes are 2+ on the right side and 2+ on the left side. Psychiatric:        Attention and Perception: Attention normal.         Mood and Affect: Mood normal.        Speech: Speech normal.  Behavior: Behavior normal. Behavior is cooperative.        Thought Content: Thought content normal.     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  Denies SI/HI today. Continue current medication regimen as ordered by psychiatry. Constipation with Wellbutrin  in past.  She is to start therapy once available, psychiatry placed a referral.      Right flank pain   Acute on and off for one month with history of kidney stones, last in 2001.  UA today overall negative, as is wet prep.  However, due to symptoms and history will start Flomax  daily for 14 days and Flexeril  to take as needed for pain, discussed with her not to take if driving or working.  Educated her on this plan of care. Ensure good hydration and avoid foods/drinks that can place at higher risk for stones. Plan for return in 2 weeks for follow-up, sooner if worsening pain.  Will get imaging if ongoing.      Relevant Orders   Urinalysis, Routine w reflex microscopic   WET PREP FOR TRICH, YEAST, CLUE   Generalized anxiety disorder   Refer to depression plan of care for further.        Follow up plan: Return in about 2 weeks (around 07/12/2024) for [facility] PAIN.

## 2024-06-28 NOTE — Assessment & Plan Note (Addendum)
 Chronic.  Denies SI/HI today. Continue current medication regimen as ordered by psychiatry. Constipation with Wellbutrin  in past.  She is to start therapy once available, psychiatry placed a referral.

## 2024-06-28 NOTE — Patient Instructions (Signed)
 Kidney Stones  Kidney stones are solid, rock-like deposits that form inside of the kidneys. The kidneys are a pair of organs that make urine. A kidney stone may form in a kidney and move into other parts of the urinary tract, including the tubes that connect the kidneys to the bladder (ureters), the bladder, and the tube that carries urine out of the body (urethra). As the stone moves through these areas, it can cause intense pain and block the flow of urine. Kidney stones are created when high levels of certain minerals are found in the urine. The stones are usually passed out of the body through urination, but in some cases, medical treatment may be needed to remove them. What are the causes? Kidney stones may be caused by: A condition in which certain glands produce too much parathyroid hormone (primary hyperparathyroidism), which causes too much calcium  buildup in the blood. A buildup of uric acid crystals in the bladder (hyperuricosuria). Uric acid is a chemical that the body produces when you eat certain foods. It usually leaves the body in the urine. Narrowing (stricture) of one or both of the ureters. A kidney blockage that is present at birth (congenital obstruction). Past surgery on the kidney or the ureters. What increases the risk? The following factors may make you more likely to develop this condition: Having had a kidney stone in the past. Having a family history of kidney stones. Not drinking enough water. Eating a diet that is high in protein, salt (sodium), or sugar. Being overweight or obese. What are the signs or symptoms? Symptoms of a kidney stone may include: Pain in the side of the abdomen, right below the ribs (flank pain). Pain usually spreads (radiates) to the groin. Needing to urinate often or urgently. Painful urination. Blood in the urine (hematuria). Nausea. Vomiting. Fever and chills. How is this diagnosed? This condition may be diagnosed based on: Your  symptoms and medical history. A physical exam. Blood tests. Urine tests. These may be done before and after the stone passes out of your body through urination. Imaging tests, such as a CT scan, abdominal X-ray, or ultrasound. A procedure to examine the inside of the bladder (cystoscopy). How is this treated? Treatment for kidney stones depends on the size, location, and makeup of the stones. Kidney stones will often pass out of the body through urination. You may need to: Increase your fluid intake to help pass the stone. In some cases, you may be given fluids through an IV and may need to be monitored in the hospital. Take medicine for pain. Make changes in your diet to help prevent kidney stones from coming back. Sometimes, procedures are needed to remove a kidney stone. This may involve: A procedure to break up kidney stones using: A focused beam of light (laser therapy). Shock waves (extracorporeal shock wave lithotripsy). Surgery to remove kidney stones. This may be needed if you have severe pain or have stones that block your urinary tract. Follow these instructions at home: Medicines Take over-the-counter and prescription medicines only as told by your health care provider. Ask your health care provider if the medicine prescribed to you requires you to avoid driving or using heavy machinery. Eating and drinking Drink enough fluid to keep your urine pale yellow. You may be instructed to drink at least 8-10 glasses of water each day. This will help you pass the kidney stone. If directed, change your diet. This may include: Limiting how much sodium you eat. Eating more fruits  and vegetables. Limiting how much animal protein you eat. Animal proteins include red meat, poultry, fish, and eggs. Eating a normal amount of calcium  (1,000-1,300 mg per day). Follow instructions from your health care provider about eating or drinking restrictions. General instructions Collect urine samples as  told by your health care provider. You may need to collect a urine sample: 24 hours after you pass the stone. 8-12 weeks after you pass the kidney stone, and every 6-12 months after that. Strain your urine every time you urinate, for as long as directed. Use the strainer that your health care provider recommends. Do not throw out the kidney stone after passing it. Keep the stone so it can be tested by your health care provider. Testing the makeup of your kidney stone may help prevent you from getting kidney stones in the future. Keep all follow-up visits. You may need follow-up X-rays or ultrasounds to make sure that your stone has passed. How is this prevented? To prevent another kidney stone: Drink enough fluid to keep your urine pale yellow. This is the best way to prevent kidney stones. Eat a healthy diet. Follow recommendations from your health care provider about foods to avoid. Recommendations vary depending on the type of kidney stone that you have. You may be instructed to eat a low-protein diet. Maintain a healthy weight. Where to find more information National Kidney Foundation (NKF): www.kidney.org [facility]): www.urologyhealth.org Contact a health care provider if: You have pain that gets worse or does not get better with medicine. Get help right away if: You have a fever or chills. You develop severe pain. You develop new abdominal pain. You faint. You are unable to urinate. Summary Kidney stones are solid, rock-like deposits that form inside of the kidneys. Kidney stones can cause nausea, vomiting, blood in the urine, abdominal pain, and the urge to urinate often. Treatment for kidney stones depends on the size, location, and makeup of the stones. Kidney stones will often pass out of the body through urination. Kidney stones can be prevented by drinking enough fluids, eating a healthy diet, and maintaining a healthy weight. This information is not intended  to replace advice given to you by your health care provider. Make sure you discuss any questions you have with your health care provider. Document Revised: 12/17/2021 Document Reviewed: 12/17/2021 Elsevier Patient Education  2024 [employer].

## 2024-06-29 LAB — URINALYSIS, ROUTINE W REFLEX MICROSCOPIC
Bilirubin, UA: NEGATIVE
Glucose, UA: NEGATIVE
Ketones, UA: NEGATIVE
Leukocytes,UA: NEGATIVE
Nitrite, UA: NEGATIVE
Protein,UA: NEGATIVE
RBC, UA: NEGATIVE
Specific Gravity, UA: 1.01 (ref 1.005–1.030)
Urobilinogen, Ur: 0.2 mg/dL (ref 0.2–1.0)
pH, UA: 6 (ref 5.0–7.5)

## 2024-06-29 LAB — WET PREP FOR TRICH, YEAST, CLUE
Clue Cell Exam: NEGATIVE
Trichomonas Exam: NEGATIVE
Yeast Exam: NEGATIVE

## 2024-07-09 NOTE — Patient Instructions (Incomplete)
 Flank Pain, Adult  Flank pain is pain in your side. The flank is the area on your side between your upper belly (abdomen) and your spine. The pain may occur over a short time (acute), or it may be long-term or come back often (chronic). It may be mild or very bad. Pain in this area can be caused by many different things.  Follow these instructions at home:    Drink enough fluid to keep your pee (urine) pale yellow.  Rest as told by your doctor.  Take over-the-counter and prescription medicines only as told by your doctor.  Keep a journal to keep track of:  What has caused your flank pain.  What has made your flank pain feel better.  Keep all follow-up visits.  Contact a doctor if:  Medicine does not help your pain.  You have new symptoms.  Your pain gets worse.  Your symptoms last longer than 2-3 days.  You have trouble peeing.  You are peeing more often than normal.  Get help right away if:  You have trouble breathing.  You are short of breath.  Your belly hurts, or it is swollen or red.  You feel like you may vomit (nauseous).  You vomit.  You feel faint, or you faint.  You have blood in your pee.  You have flank pain and a fever.  These symptoms may be an emergency. Get help right away. Call your local emergency services (911 in the [place]  Do not wait to see if the symptoms will go away.  Do not drive yourself to the hospital.  Summary  Flank pain is pain in your side. The flank is the area of your side between your upper belly (abdomen) and your spine.  Flank pain may occur over a short time (acute), or it may be long-term or come back often (chronic). It may be mild or very bad.  Pain in this area can be caused by many different things.  Contact your doctor if your symptoms get worse or last longer than 2-3 days.  This information is not intended to replace advice given to you by your health care provider. Make sure you discuss any questions you have with your health care provider.  Document Revised:  11/18/2020 Document Reviewed: 11/18/2020  Elsevier Patient Education  2024 [employer].

## 2024-07-11 ENCOUNTER — Ambulatory Visit: Admitting: Nurse Practitioner

## 2024-08-12 NOTE — Patient Instructions (Incomplete)
 Be Involved in Caring For Your Health:  Taking Medications When medications are taken as directed, they can greatly improve your health. But if they are not taken as prescribed, they may not work. In some cases, not taking them correctly can be harmful. To help ensure your treatment remains effective and safe, understand your medications and how to take them. Bring your medications to each visit for review by your provider.  Your lab results, notes, and after visit summary will be available on My Chart. We strongly encourage you to use this feature. If lab results are abnormal the clinic will contact you with the appropriate steps. If the clinic does not contact you assume the results are satisfactory. You can always view your results on My Chart. If you have questions regarding your health or results, please contact the clinic during office hours. You can also ask questions on My Chart.  We at [facility] are grateful that you chose us  to provide your care. We strive to provide evidence-based and compassionate care and are always looking for feedback. If you get a survey from the clinic please complete this so we can hear your opinions.  Managing Depression, Adult Depression is a mental health condition that affects your thoughts, feelings, and actions. Being diagnosed with depression can bring you relief if you did not know why you have felt or behaved a certain way. It could also leave you feeling overwhelmed. Finding ways to manage your symptoms can help you feel more positive about your future. How to manage lifestyle changes Being depressed is difficult. Depression can increase the level of everyday stress. Stress can make depression symptoms worse. You may believe your symptoms cannot be managed or will never improve. However, there are many things you can try to help manage your symptoms. There is hope. Managing stress  Stress is your body's reaction to life changes and events,  both good and bad. Stress can add to your feelings of depression. Learning to manage your stress can help lessen your feelings of depression. Try some of the following approaches to reducing your stress (stress reduction techniques): Listen to music that you enjoy and that inspires you. Try using a meditation app or take a meditation class. Develop a practice that helps you connect with your spiritual self. Walk in nature, pray, or go to a place of worship. Practice deep breathing. To do this, inhale slowly through your nose. Pause at the top of your inhale for a few seconds and then exhale slowly, letting yourself relax. Repeat this three or four times. Practice yoga to help relax and work your muscles. Choose a stress reduction technique that works for you. These techniques take time and practice to develop. Set aside 5-15 minutes a day to do them. Therapists can offer training in these techniques. Do these things to help manage stress: Keep a journal. Know your limits. Set healthy boundaries for yourself and others, such as saying no when you think something is too much. Pay attention to how you react to certain situations. You may not be able to control everything, but you can change your reaction. Add humor to your life by watching funny movies or shows. Make time for activities that you enjoy and that relax you. Spend less time using electronics, especially at night before bed. The light from screens can make your brain think it is time to get up rather than go to bed.  Medicines Medicines, such as antidepressants, are often a part of  treatment for depression. Talk with your pharmacist or health care provider about all the medicines, supplements, and herbal products that you take, their possible side effects, and what medicines and other products are safe to take together. Make sure to report any side effects you may have to your health care provider. Relationships Your health care  provider may suggest family therapy, couples therapy, or individual therapy as part of your treatment. How to recognize changes Everyone responds differently to treatment for depression. As you recover from depression, you may start to: Have more interest in doing activities. Feel more hopeful. Have more energy. Eat a more regular amount of food. Have better mental focus. It is important to recognize if your depression is not getting better or is getting worse. The symptoms you had in the beginning may return, such as: Feeling tired. Eating too much or too little. Sleeping too much or too little. Feeling restless, agitated, or hopeless. Trouble focusing or making decisions. Having unexplained aches and pains. Feeling irritable, angry, or aggressive. If you or your family members notice these symptoms coming back, let your health care provider know right away. Follow these instructions at home: Activity Try to get some form of exercise each day, such as walking. Try yoga, mindfulness, or other stress reduction techniques. Participate in group activities if you are able. Lifestyle Get enough sleep. Cut down on or stop using caffeine, tobacco, alcohol, and any other harmful substances. Eat a healthy diet that includes plenty of vegetables, fruits, whole grains, low-fat dairy products, and lean protein. Limit foods that are high in solid fats, added sugar, or salt (sodium). General instructions Take over-the-counter and prescription medicines only as told by your health care provider. Keep all follow-up visits. It is important for your health care provider to check on your mood, behavior, and medicines. Your health care provider may need to make changes to your treatment. Where to find support Talking to others  Friends and family members can be sources of support and guidance. Talk to trusted friends or family members about your condition. Explain your symptoms and let them know that you  are working with a health care provider to treat your depression. Tell friends and family how they can help. Finances Find mental health providers that fit with your financial situation. Talk with your health care provider if you are worried about access to food, housing, or medicine. Call your insurance company to learn about your co-pays and prescription plan. Where to find more information You can find support in your area from: Anxiety and Depression Association of America (ADAA): adaa.org Mental Health America: mentalhealthamerica.net The [employer] on Mental Illness: nami.org Contact a health care provider if: You stop taking your antidepressant medicines, and you have any of these symptoms: Nausea. Headache. Light-headedness. Chills and body aches. Not being able to sleep (insomnia). You or your friends and family think your depression is getting worse. Get help right away if: You have thoughts of hurting yourself or others. Get help right away if you feel like you may hurt yourself or others, or have thoughts about taking your own life. Go to your nearest emergency room or: Call 911. Call the National Suicide Prevention Lifeline at (743) 630-7432 or 988. This is open 24 hours a day. Text the Crisis Text Line at 854-590-3828. This information is not intended to replace advice given to you by your health care provider. Make sure you discuss any questions you have with your health care provider. Document Revised: 01/13/2022 Document Reviewed:  01/13/2022 Elsevier Patient Education  2024 [employer].

## 2024-08-14 ENCOUNTER — Ambulatory Visit: Admitting: Nurse Practitioner

## 2024-08-14 DIAGNOSIS — Z0000 Encounter for general adult medical examination without abnormal findings: Secondary | ICD-10-CM

## 2024-08-14 DIAGNOSIS — F332 Major depressive disorder, recurrent severe without psychotic features: Secondary | ICD-10-CM

## 2024-08-14 DIAGNOSIS — Z124 Encounter for screening for malignant neoplasm of cervix: Secondary | ICD-10-CM

## 2024-08-14 DIAGNOSIS — E7800 Pure hypercholesterolemia, unspecified: Secondary | ICD-10-CM

## 2024-08-14 DIAGNOSIS — F411 Generalized anxiety disorder: Secondary | ICD-10-CM

## 2024-08-14 DIAGNOSIS — G43109 Migraine with aura, not intractable, without status migrainosus: Secondary | ICD-10-CM

## 2024-08-14 DIAGNOSIS — E538 Deficiency of other specified B group vitamins: Secondary | ICD-10-CM

## 2024-08-14 DIAGNOSIS — Z79899 Other long term (current) drug therapy: Secondary | ICD-10-CM

## 2024-09-05 ENCOUNTER — Other Ambulatory Visit: Payer: Self-pay | Admitting: Nurse Practitioner

## 2024-09-05 NOTE — Telephone Encounter (Unsigned)
 Copied from CRM #[id number]. Topic: Clinical - Medication Refill >> Sep 05, 2024  3:01 PM Rachelle R wrote: Medication: sertraline  (ZOLOFT ) 100 MG tablet  Has the patient contacted their pharmacy? Yes, call dr  This is the patient's preferred pharmacy:  [id number] [place], [place] - [address] AT [facility] OF [address] [place] [address] Phone: [phone] Fax: [phone]  Is this the correct pharmacy for this prescription? Yes  Has the prescription been filled recently? No  Is the patient out of the medication? No  Has the patient been seen for an appointment in the last year OR does the patient have an upcoming appointment? Yes  Can we respond through MyChart? Yes  Agent: Please be advised that Rx refills may take up to 3 business days. We ask that you follow-up with your pharmacy.

## 2024-09-07 ENCOUNTER — Other Ambulatory Visit: Payer: Self-pay | Admitting: Nurse Practitioner

## 2024-09-08 ENCOUNTER — Ambulatory Visit
Admission: EM | Admit: 2024-09-08 | Discharge: 2024-09-08 | Disposition: A | Discharge status: Home / Self Care | Attending: Emergency Medicine | Admitting: Emergency Medicine

## 2024-09-08 ENCOUNTER — Encounter: Payer: Self-pay | Admitting: Emergency Medicine

## 2024-09-08 ENCOUNTER — Encounter: Payer: Self-pay | Admitting: Nurse Practitioner

## 2024-09-08 DIAGNOSIS — Z20828 Contact with and (suspected) exposure to other viral communicable diseases: Secondary | ICD-10-CM

## 2024-09-08 DIAGNOSIS — J069 Acute upper respiratory infection, unspecified: Secondary | ICD-10-CM

## 2024-09-08 LAB — POC COVID19/FLU A&B COMBO
Covid Antigen, POC: NEGATIVE
Influenza A Antigen, POC: NEGATIVE
Influenza B Antigen, POC: NEGATIVE

## 2024-09-08 MED ORDER — SERTRALINE HCL 100 MG PO TABS: 200.0000 mg | ORAL_TABLET | Freq: Every day | ORAL | 1 refills | Status: DC

## 2024-09-08 MED ORDER — AZITHROMYCIN 250 MG PO TABS: 250.0000 mg | ORAL_TABLET | Freq: Every day | ORAL | 0 refills | Status: DC

## 2024-09-08 MED ORDER — SERTRALINE HCL 100 MG PO TABS: 200.0000 mg | ORAL_TABLET | Freq: Every day | ORAL | 4 refills | Status: AC

## 2024-09-08 MED ORDER — OSELTAMIVIR PHOSPHATE 75 MG PO CAPS: 75.0000 mg | ORAL_CAPSULE | Freq: Two times a day (BID) | ORAL | 0 refills | Status: DC

## 2024-09-08 NOTE — ED Provider Notes (Signed)
 " Stephanie Logan    CSN: [id number] Arrival date & time: 09/08/24  1225      History   Chief Complaint Chief Complaint  Patient presents with   Generalized Body Aches   Headache    HPI Stephanie Logan is a 33 y.o. female.   Patient presents for evaluation of a nonproductive cough and generalized bodyaches beginning this morning.  Recent upper respiratory infection involving nasal congestion sore throat headache occurring over the past 2 weeks, improved for 24 hours but did not fully resolve.  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  Denies fever.     Past Medical History:  Diagnosis Date   Amenorrhea    Anxiety    Kidney stone    PCOS (polycystic ovarian syndrome)    Post partum depression     Patient Active Problem List   Diagnosis Date Noted   Right flank pain 06/28/2024   Gastroesophageal reflux disease without esophagitis 07/07/2022   Pelvic congestive syndrome 08/04/2021   Elevated low density lipoprotein (LDL) cholesterol level 06/24/2021   Controlled substance agreement signed 04/16/2021   Long term prescription benzodiazepine use 04/16/2021   Migraine with aura 04/16/2021   History of kidney stones 04/16/2021   History of abnormal mammogram 04/16/2021   Generalized anxiety disorder 04/12/2021   Liver hemangioma 08/01/2018   Vitamin B 12 deficiency 05/30/2018   History of IUFD 02/07/2017   Chronic pain of right knee 02/18/2016   Severe episode of recurrent major depressive disorder, without psychotic features (HCC)    PCOS (polycystic ovarian syndrome) 07/19/2015    Past Surgical History:  Procedure Laterality Date   ABDOMINAL SURGERY  10/24/2021   vessel ablation   BREAST EXCISIONAL BIOPSY Right 2018   fibroadenoma   BREAST EXCISIONAL BIOPSY Left 2010   fibroadenoma   BREAST FIBROADENOMA SURGERY  2008,2010   EXTRACORPOREAL SHOCK WAVE LITHOTRIPSY Right 05/16/2020   Procedure: EXTRACORPOREAL SHOCK WAVE LITHOTRIPSY  (ESWL);  Surgeon: Twylla Glendia JAYSON, MD;  Location: [facility] ORS;  Service: Urology;  Laterality: Right;    OB History     Gravida  5   Para  2   Term  1   Preterm  1   AB  1   Living  3      SAB  1   IAB  0   Ectopic  0   Multiple  0   Live Births  3            Home Medications    Prior to Admission medications  Medication Sig Start Date End Date Taking? Authorizing Provider  albuterol  (VENTOLIN  HFA) 108 (90 Base) MCG/ACT inhaler INHALE 2 PUFFS INTO THE LUNGS EVERY 6 HOURS AS NEEDED 07/14/23   Cannady, Jolene T, NP  ALPRAZolam  (XANAX ) 0.5 MG tablet Take 1 tablet (0.5 mg total) by mouth 2 (two) times daily as needed for anxiety. 11/15/23   Cannady, Jolene T, NP  ARIPiprazole  (ABILIFY ) 10 MG tablet Take 10 mg by mouth daily. 07/10/24   [provider]  cloNIDine (CATAPRES) 0.1 MG tablet Take 0.1 mg by mouth at bedtime.    [provider]  cyclobenzaprine  (FLEXERIL ) 10 MG tablet Take 1 tablet (10 mg total) by mouth 3 (three) times daily as needed for muscle spasms. 06/28/24   Cannady, Jolene T, NP  rizatriptan  (MAXALT -MLT) 5 MG disintegrating tablet Take 1 tablet (5 mg total) by mouth as needed for migraine. May repeat in 2 hours if needed 01/04/23  Cannady, Jolene T, NP  sertraline  (ZOLOFT ) 100 MG tablet Take 2 tablets (200 mg total) by mouth daily. 09/08/24   Cannady, Jolene T, NP  sertraline  (ZOLOFT ) 100 MG tablet Take 2 tablets (200 mg total) by mouth daily. 09/08/24   Valerio Melanie DASEN, NP    Family History Family History  Problem Relation Age of Onset   Cancer Mother        cervical, precancerous   Cancer Father        Colon, Precancerous   Seizures Sister    Lung cancer Sister    Heart Problems Sister    Hashimoto's thyroiditis Brother    Learning disabilities Son    Cancer Maternal Grandmother        brain   Healthy Maternal Grandfather    Healthy Paternal Grandmother    Aneurysm Paternal Grandfather        Brain   Breast cancer Neg  Hx     Social History Social History[1]   Allergies   Mucinex dm [dm-guaifenesin er] and Ciprofloxacin   Review of Systems Review of Systems  HENT:  Positive for sore throat. Negative for congestion, dental problem, drooling, ear discharge, ear pain, facial swelling, hearing loss, mouth sores, nosebleeds, postnasal drip, rhinorrhea, sinus pressure, sinus pain, sneezing, tinnitus, trouble swallowing and voice change.   Respiratory:  Positive for cough. Negative for apnea, choking, chest tightness, shortness of breath, wheezing and stridor.   Neurological:  Positive for headaches. Negative for dizziness, tremors, seizures, syncope, facial asymmetry, speech difficulty, weakness, light-headedness and numbness.     Physical Exam Triage Vital Signs ED Triage Vitals  Encounter Vitals Group     BP 09/08/24 1415 101/64     Girls Systolic BP Percentile --      Girls Diastolic BP Percentile --      Boys Systolic BP Percentile --      Boys Diastolic BP Percentile --      Pulse Rate 09/08/24 1415 (!) 108     Resp 09/08/24 1415 20     Temp 09/08/24 1415 99.7 F (37.6 C)     Temp Source 09/08/24 1415 Oral     SpO2 09/08/24 1415 97 %     Weight --      Height --      Head Circumference --      Peak Flow --      Pain Score 09/08/24 1418 5     Pain Loc --      Pain Education --      Exclude from Growth Chart --    No data found.  Updated Vital Signs BP 101/64 (BP Location: Left Arm)   Pulse (!) 108   Temp 99.7 F (37.6 C) (Oral)   Resp 20   LMP 08/22/2024 (Exact Date)   SpO2 97%   Visual Acuity Right Eye Distance:   Left Eye Distance:   Bilateral Distance:    Right Eye Near:   Left Eye Near:    Bilateral Near:     Physical Exam Constitutional:      Appearance: Normal appearance.  HENT:     Head: Normocephalic.     Right Ear: Tympanic membrane, ear canal and external ear normal.     Left Ear: Tympanic membrane, ear canal and external ear normal.     Nose: Congestion  present.     Mouth/Throat:     Pharynx: No oropharyngeal exudate or posterior oropharyngeal erythema.  Eyes:     Extraocular Movements:  Extraocular movements intact.  Cardiovascular:     Rate and Rhythm: Normal rate and regular rhythm.     Pulses: Normal pulses.     Heart sounds: Normal heart sounds.  Pulmonary:     Effort: Pulmonary effort is normal.     Breath sounds: Normal breath sounds.  Neurological:     Mental Status: She is alert and oriented to person, place, and time. Mental status is at baseline.      UC Treatments / Results  Labs (all labs ordered are listed, but only abnormal results are displayed) Labs Reviewed  POC COVID19/FLU A&B COMBO    EKG   Radiology No results found.  Procedures Procedures (including critical care time)  Medications Ordered in UC Medications - No data to display  Initial Impression / Assessment and Plan / UC Course  I have reviewed the triage vital signs and the nursing notes.  Pertinent labs & imaging results that were available during my care of the patient were reviewed by me and considered in my medical decision making (see chart for details).  Acute URI, exposure to the flu  Vital signs stable, patient in no signs of distress nontoxic-appearing, lungs clear to auscultation low suspicion for pneumonia or bronchitis, stable for outpatient management, deferring imaging, etiology persisting upper respiratory infection versus new virus, discussed, COVID and flu testing negative, empirically prescribing both antibiotics and antiviral, prescribed Tamiflu  and azithromycin  recommended additional over-the-counter medications and nonpharmacological measures for supportive care Final Clinical Impressions(s) / UC Diagnoses   Final diagnoses:  Viral illness   Discharge Instructions   None    ED Prescriptions   None    PDMP not reviewed this encounter.     [1]  Social History Tobacco Use   Smoking status: Never    Passive  exposure: Never   Smokeless tobacco: Never  Vaping Use   Vaping status: Never Used  Substance Use Topics   Alcohol use: Yes    Comment: occas   Drug use: No     Teresa Shelba SAUNDERS, NP 09/08/24 1513  "

## 2024-09-08 NOTE — ED Triage Notes (Signed)
 Patient complains of headache and body aches that started today. Patient took Tylenol  today with mild relief. Rates headache 5/10 and body aches 4/10.

## 2024-09-08 NOTE — Discharge Instructions (Signed)
 Symptoms could be related to recent upper respiratory infection versus new virus therefore you will be covered for both  COVID and flu testing are negative  As you had close exposure to influenza your prophylactically being placed on Tamiflu, take twice daily for 5 days  Take azithromycin  for coverage of bacteria if related to prior infection  You can take Tylenol  and/or Ibuprofen  as needed for fever reduction and pain relief.   For cough: honey 1/2 to 1 teaspoon (you can dilute the honey in water or another fluid).  You can also use guaifenesin and dextromethorphan for cough. You can use a humidifier for chest congestion and cough.  If you don't have a humidifier, you can sit in the bathroom with the hot shower running.      For sore throat: try warm salt water gargles, cepacol lozenges, throat spray, warm tea or water with lemon/honey, popsicles or ice, or OTC cold relief medicine for throat discomfort.   For congestion: take a daily anti-histamine like Zyrtec, Claritin, and a oral decongestant, such as pseudoephedrine.  You can also use Flonase 1-2 sprays in each nostril daily.   It is important to stay hydrated: drink plenty of fluids (water, gatorade/powerade/pedialyte, juices, or teas) to keep your throat moisturized and help further relieve irritation/discomfort.

## 2024-09-08 NOTE — Telephone Encounter (Signed)
 Requested Prescriptions  Pending Prescriptions Disp Refills   sertraline  (ZOLOFT ) 100 MG tablet 180 tablet 1    Sig: Take 2 tablets (200 mg total) by mouth daily.     Psychiatry:  Antidepressants - SSRI - sertraline  Failed - 09/08/2024 12:03 PM      Failed - AST in normal range and within 360 days    AST  Date Value Ref Range Status  08/06/2023 20 0 - 40 IU/L Final         Failed - ALT in normal range and within 360 days    ALT  Date Value Ref Range Status  08/06/2023 11 0 - 32 IU/L Final         Passed - Completed PHQ-2 or PHQ-9 in the last 360 days      Passed - Valid encounter within last 6 months    Recent Outpatient Visits           2 months ago Severe episode of recurrent major depressive disorder, without psychotic features (HCC)   [place] [facility] [place], Jolene T, NP   5 months ago Severe episode of recurrent major depressive disorder, without psychotic features (HCC)   [place] [facility] [place], Jolene T, NP   6 months ago Severe episode of recurrent major depressive disorder, without psychotic features (HCC)   [place] [facility] [place], Jolene T, NP   9 months ago Severe episode of recurrent major depressive disorder, without psychotic features (HCC)   [place] [facility] [place], Melanie DASEN, NP       Future Appointments             Tomorrow Home Urgent Care at [facility] [place]

## 2024-09-09 ENCOUNTER — Ambulatory Visit

## 2024-09-09 NOTE — Telephone Encounter (Signed)
 Duplicate request, refilled 09/08/24.  Requested Prescriptions  Pending Prescriptions Disp Refills   sertraline  (ZOLOFT ) 100 MG tablet [Pharmacy Med Name: SERTRALINE  100MG  TABLETS] 180 tablet 4    Sig: TAKE 2 TABLETS(200 MG) BY MOUTH DAILY     Psychiatry:  Antidepressants - SSRI - sertraline  Failed - 09/09/2024  9:50 AM      Failed - AST in normal range and within 360 days    AST  Date Value Ref Range Status  08/06/2023 20 0 - 40 IU/L Final         Failed - ALT in normal range and within 360 days    ALT  Date Value Ref Range Status  08/06/2023 11 0 - 32 IU/L Final         Passed - Completed PHQ-2 or PHQ-9 in the last 360 days      Passed - Valid encounter within last 6 months    Recent Outpatient Visits           2 months ago Severe episode of recurrent major depressive disorder, without psychotic features (HCC)   [place] [facility] [place], Jolene T, NP   5 months ago Severe episode of recurrent major depressive disorder, without psychotic features (HCC)   [place] [facility] [place], Jolene T, NP   6 months ago Severe episode of recurrent major depressive disorder, without psychotic features (HCC)   [place] [facility] [place], Jolene T, NP   9 months ago Severe episode of recurrent major depressive disorder, without psychotic features (HCC)   [place] [facility] [place], Melanie DASEN, NP       Future Appointments             Today  Urgent Care at [facility]

## 2024-10-08 NOTE — Patient Instructions (Signed)
 Be Involved in Caring For Your Health:  Taking Medications When medications are taken as directed, they can greatly improve your health. But if they are not taken as prescribed, they may not work. In some cases, not taking them correctly can be harmful. To help ensure your treatment remains effective and safe, understand your medications and how to take them. Bring your medications to each visit for review by your provider.  Your lab results, notes, and after visit summary will be available on My Chart. We strongly encourage you to use this feature. If lab results are abnormal the clinic will contact you with the appropriate steps. If the clinic does not contact you assume the results are satisfactory. You can always view your results on My Chart. If you have questions regarding your health or results, please contact the clinic during office hours. You can also ask questions on My Chart.  We at [facility] are grateful that you chose us  to provide your care. We strive to provide evidence-based and compassionate care and are always looking for feedback. If you get a survey from the clinic please complete this so we can hear your opinions.  Managing Depression, Adult Depression is a mental health condition that affects your thoughts, feelings, and actions. Being diagnosed with depression can bring you relief if you did not know why you have felt or behaved a certain way. It could also leave you feeling overwhelmed. Finding ways to manage your symptoms can help you feel more positive about your future. How to manage lifestyle changes Being depressed is difficult. Depression can increase the level of everyday stress. Stress can make depression symptoms worse. You may believe your symptoms cannot be managed or will never improve. However, there are many things you can try to help manage your symptoms. There is hope. Managing stress  Stress is your body's reaction to life changes and events,  both good and bad. Stress can add to your feelings of depression. Learning to manage your stress can help lessen your feelings of depression. Try some of the following approaches to reducing your stress (stress reduction techniques): Listen to music that you enjoy and that inspires you. Try using a meditation app or take a meditation class. Develop a practice that helps you connect with your spiritual self. Walk in nature, pray, or go to a place of worship. Practice deep breathing. To do this, inhale slowly through your nose. Pause at the top of your inhale for a few seconds and then exhale slowly, letting yourself relax. Repeat this three or four times. Practice yoga to help relax and work your muscles. Choose a stress reduction technique that works for you. These techniques take time and practice to develop. Set aside 5-15 minutes a day to do them. Therapists can offer training in these techniques. Do these things to help manage stress: Keep a journal. Know your limits. Set healthy boundaries for yourself and others, such as saying no when you think something is too much. Pay attention to how you react to certain situations. You may not be able to control everything, but you can change your reaction. Add humor to your life by watching funny movies or shows. Make time for activities that you enjoy and that relax you. Spend less time using electronics, especially at night before bed. The light from screens can make your brain think it is time to get up rather than go to bed.  Medicines Medicines, such as antidepressants, are often a part of  treatment for depression. Talk with your pharmacist or health care provider about all the medicines, supplements, and herbal products that you take, their possible side effects, and what medicines and other products are safe to take together. Make sure to report any side effects you may have to your health care provider. Relationships Your health care  provider may suggest family therapy, couples therapy, or individual therapy as part of your treatment. How to recognize changes Everyone responds differently to treatment for depression. As you recover from depression, you may start to: Have more interest in doing activities. Feel more hopeful. Have more energy. Eat a more regular amount of food. Have better mental focus. It is important to recognize if your depression is not getting better or is getting worse. The symptoms you had in the beginning may return, such as: Feeling tired. Eating too much or too little. Sleeping too much or too little. Feeling restless, agitated, or hopeless. Trouble focusing or making decisions. Having unexplained aches and pains. Feeling irritable, angry, or aggressive. If you or your family members notice these symptoms coming back, let your health care provider know right away. Follow these instructions at home: Activity Try to get some form of exercise each day, such as walking. Try yoga, mindfulness, or other stress reduction techniques. Participate in group activities if you are able. Lifestyle Get enough sleep. Cut down on or stop using caffeine, tobacco, alcohol, and any other harmful substances. Eat a healthy diet that includes plenty of vegetables, fruits, whole grains, low-fat dairy products, and lean protein. Limit foods that are high in solid fats, added sugar, or salt (sodium). General instructions Take over-the-counter and prescription medicines only as told by your health care provider. Keep all follow-up visits. It is important for your health care provider to check on your mood, behavior, and medicines. Your health care provider may need to make changes to your treatment. Where to find support Talking to others  Friends and family members can be sources of support and guidance. Talk to trusted friends or family members about your condition. Explain your symptoms and let them know that you  are working with a health care provider to treat your depression. Tell friends and family how they can help. Finances Find mental health providers that fit with your financial situation. Talk with your health care provider if you are worried about access to food, housing, or medicine. Call your insurance company to learn about your co-pays and prescription plan. Where to find more information You can find support in your area from: Anxiety and Depression Association of [place] (ADAA): adaa.org Mental Health [place]: mentalhealthamerica.net [employer] on Mental Illness: nami.org Contact a health care provider if: You stop taking your antidepressant medicines, and you have any of these symptoms: Nausea. Headache. Light-headedness. Chills and body aches. Not being able to sleep (insomnia). You or your friends and family think your depression is getting worse. Get help right away if: You have thoughts of hurting yourself or others. Get help right away if you feel like you may hurt yourself or others, or have thoughts about taking your own life. Go to your nearest emergency room or: Call 911. Call the National Suicide Prevention Lifeline at (743) 630-7432 or 988. This is open 24 hours a day. Text the Crisis Text Line at 854-590-3828. This information is not intended to replace advice given to you by your health care provider. Make sure you discuss any questions you have with your health care provider. Document Revised: 01/13/2022 Document Reviewed:  01/13/2022 [employer]vier Patient Education  2024 [employer].

## 2024-10-12 ENCOUNTER — Other Ambulatory Visit (HOSPITAL_COMMUNITY)
Admission: RE | Admit: 2024-10-12 | Discharge: 2024-10-12 | Disposition: A | Source: Ambulatory Visit | Attending: Nurse Practitioner | Admitting: Nurse Practitioner

## 2024-10-12 ENCOUNTER — Ambulatory Visit (INDEPENDENT_AMBULATORY_CARE_PROVIDER_SITE_OTHER): Admitting: Nurse Practitioner

## 2024-10-12 VITALS — BP 99/64 | HR 89 | Temp 98.1°F | Ht 63.0 in | Wt 158.4 lb

## 2024-10-12 DIAGNOSIS — E7800 Pure hypercholesterolemia, unspecified: Secondary | ICD-10-CM

## 2024-10-12 DIAGNOSIS — F332 Major depressive disorder, recurrent severe without psychotic features: Secondary | ICD-10-CM | POA: Diagnosis not present

## 2024-10-12 DIAGNOSIS — Z79899 Other long term (current) drug therapy: Secondary | ICD-10-CM | POA: Diagnosis not present

## 2024-10-12 DIAGNOSIS — G43109 Migraine with aura, not intractable, without status migrainosus: Secondary | ICD-10-CM | POA: Diagnosis not present

## 2024-10-12 DIAGNOSIS — K219 Gastro-esophageal reflux disease without esophagitis: Secondary | ICD-10-CM

## 2024-10-12 DIAGNOSIS — E538 Deficiency of other specified B group vitamins: Secondary | ICD-10-CM

## 2024-10-12 DIAGNOSIS — F411 Generalized anxiety disorder: Secondary | ICD-10-CM | POA: Diagnosis not present

## 2024-10-12 DIAGNOSIS — Z0000 Encounter for general adult medical examination without abnormal findings: Secondary | ICD-10-CM | POA: Diagnosis not present

## 2024-10-12 DIAGNOSIS — Z124 Encounter for screening for malignant neoplasm of cervix: Secondary | ICD-10-CM | POA: Insufficient documentation

## 2024-10-12 MED ORDER — ALPRAZOLAM 0.5 MG PO TABS: 0.5000 mg | ORAL_TABLET | Freq: Two times a day (BID) | ORAL | 2 refills | Status: AC | PRN

## 2024-10-12 MED ORDER — RIZATRIPTAN BENZOATE 5 MG PO TBDP: 5.0000 mg | ORAL_TABLET | ORAL | 3 refills | Status: AC | PRN

## 2024-10-12 NOTE — Progress Notes (Signed)
 "  BP 99/64   Pulse 89   Temp 98.1 F (36.7 C) (Oral)   Ht 5' 3 (1.6 m)   Wt 158 lb 6.4 oz (71.8 kg)   LMP 09/22/2024 (Approximate)   SpO2 96%   BMI 28.06 kg/m    Subjective:    Patient ID: Stephanie Logan, female    DOB: 1991/01/14, 34 y.o.   MRN: [id number]  HPI: Stephanie Logan is a 34 y.o. female presenting on 10/12/2024 for comprehensive medical examination. Current medical complaints include:none  She currently lives with: husband Menopausal Symptoms: no  MIGRAINES Has Maxalt  for migraines if needed. Benefited from Botox in past, but has not had in awhile.  Migraines have decreased, last was one month ago. Duration: years Onset: gradual Severity: 6-7/10  Quality: dull, aching, stabbing, and throbbing Frequency: intermittent Location: to right side behind eye Headache duration: 24 to 48 hours Radiation: no Time of day headache occurs: varies Alleviating factors: ice and heat on neck Aggravating factors: lack of sleep Headache status at time of visit: asymptomatic Treatments attempted: ice, heat, APAP, and ibuprofen    Aura: yes Nausea:  yes Vomiting: no Photophobia:  yes Phonophobia:  yes Effect on social functioning:  yes Numbers of missed days of school/work each month: missed one day Confusion:  no Gait disturbance/ataxia:  no Behavioral changes:  no Fevers:  no   ANXIETY/STRESS Following with [facility] for psychiatry, last visit was about one month ago. Does talk therapy with them too. Taking Xanax , Abilify , Ritalin, Sertraline , and Propranolol PRN.    Husband is double amputee, lost legs in [place]. Has history of stillborn child. Pt is aware of risks of benzo medication use to include increased sedation, respiratory suppression, falls, dependence and cardiovascular events.  Pt would like to continue treatment as benefit determined to outweigh risk. PCP continues to fill Xanax  and Zoloft , psychiatry fills all other medications. Last Xanax  fill  05/01/24. Duration:stable Anxious mood: a little here and there Excessive worrying: a little bit Irritability: a little bit Sweating: no Nausea:  no Palpitations: no Hyperventilation: no Panic attacks: no Agoraphobia: no  Obscessions/compulsions: no Depressed mood: not really    10/12/2024   10:58 AM 06/28/2024    4:23 PM 03/22/2024    2:41 PM 03/07/2024    2:30 PM 11/15/2023    1:26 PM  Depression screen PHQ 2/9  Decreased Interest 0 1 0 3 1  Down, Depressed, Hopeless 1 2 1 3 1   PHQ - 2 Score 1 3 1 6 2   Altered sleeping 1 3 1 3 2   Tired, decreased energy 1 2 0 3 1  Change in appetite 0 1 1 3 1   Feeling bad or failure about yourself  0 1 0 3 1  Trouble concentrating 1 2 1 3 1   Moving slowly or fidgety/restless 0 0 0 0 0  Suicidal thoughts 0 0 0 2 0  PHQ-9 Score 4 12  4  23  8    Difficult doing work/chores Not difficult at all Somewhat difficult Not difficult at all Very difficult Not difficult at all     Data saved with a previous flowsheet row definition  Anhedonia: no Weight changes: no Insomnia: no Hypersomnia: no Fatigue/loss of energy: occasional Feelings of worthlessness: no Feelings of guilt: no Impaired concentration/indecisiveness: no Suicidal ideations: no  Crying spells: yes Recent Stressors/Life Changes: no   Relationship problems: no   Family stress: no   Financial stress: no    Job stress: none  Recent death/loss: no     10/12/2024   10:59 AM 06/28/2024    4:23 PM 03/22/2024    2:42 PM 03/07/2024    2:30 PM  GAD 7 : Generalized Anxiety Score  Nervous, Anxious, on Edge 1 2  0  1   Control/stop worrying 1 1  0  1   Worry too much - different things 1 1  1  2    Trouble relaxing 0 2  0  1   Restless 0 2  1  1    Easily annoyed or irritable 1 2  1  3    Afraid - awful might happen 0 2  1  3    Total GAD 7 Score 4 12 4 12   Anxiety Difficulty Not difficult at all Somewhat difficult Somewhat difficult Very difficult     Data saved with a previous flowsheet  row definition       01/04/2023    1:44 PM 08/06/2023    1:23 PM 11/15/2023    1:26 PM 06/28/2024    4:31 PM 10/12/2024   10:58 AM  Fall Risk  Falls in the past year? 0 0 0 0 0  Was there an injury with Fall? 0  0  0  0  0  Fall Risk Category Calculator 0 0 0 0 0  Patient at Risk for Falls Due to No Fall Risks No Fall Risks No Fall Risks No Fall Risks No Fall Risks  Fall risk Follow up Falls evaluation completed Falls prevention discussed Falls evaluation completed Falls evaluation completed Falls evaluation completed     Data saved with a previous flowsheet row definition    Functional Status Survey: Is the patient deaf or have difficulty hearing?: No Does the patient have difficulty seeing, even when wearing glasses/contacts?: No Does the patient have difficulty concentrating, remembering, or making decisions?: No Does the patient have difficulty walking or climbing stairs?: No Does the patient have difficulty dressing or bathing?: No Does the patient have difficulty doing errands alone such as visiting a doctor's office or shopping?: No   Past Medical History:  Past Medical History:  Diagnosis Date   Amenorrhea    Anxiety    Kidney stone    PCOS (polycystic ovarian syndrome)    Post partum depression     Surgical History:  Past Surgical History:  Procedure Laterality Date   ABDOMINAL SURGERY  10/24/2021   vessel ablation   BREAST EXCISIONAL BIOPSY Right 2018   fibroadenoma   BREAST EXCISIONAL BIOPSY Left 2010   fibroadenoma   BREAST FIBROADENOMA SURGERY  2008,2010   EXTRACORPOREAL SHOCK WAVE LITHOTRIPSY Right 05/16/2020   Procedure: EXTRACORPOREAL SHOCK WAVE LITHOTRIPSY (ESWL);  Surgeon: Twylla Glendia BROCKS, MD;  Location: [facility] ORS;  Service: Urology;  Laterality: Right;    Medications:  Current Outpatient Medications on File Prior to Visit  Medication Sig   ALPRAZolam  (XANAX ) 0.5 MG tablet Take 1 tablet (0.5 mg total) by mouth 2 (two) times daily as needed for  anxiety.   ARIPiprazole  (ABILIFY ) 10 MG tablet Take 10 mg by mouth daily.   methylphenidate 36 MG PO CR tablet Take 36 mg by mouth every morning.   propranolol (INDERAL) 10 MG tablet Take 10 mg by mouth every 8 (eight) hours as needed.   sertraline  (ZOLOFT ) 100 MG tablet Take 2 tablets (200 mg total) by mouth daily.   No current facility-administered medications on file prior to visit.    Allergies:  Allergies  Allergen Reactions   Mucinex  Dm [Dm-Guaifenesin Er] Hives and Swelling   Ciprofloxacin Rash    Social History:  Social History   Socioeconomic History   Marital status: Married    Spouse name: Honora   Number of children: 3   Years of education: Not on file   Highest education level: Associate degree: academic program  Occupational History   Not on file  Tobacco Use   Smoking status: Never    Passive exposure: Never   Smokeless tobacco: Never  Vaping Use   Vaping status: Never Used  Substance and Sexual Activity   Alcohol use: Yes    Comment: occas   Drug use: Never   Sexual activity: Yes    Birth control/protection: Condom    Comment: undecided  Other Topics Concern   Not on file  Social History Narrative   All boys at home -- age 46, 46, 2.  Is going back at [employer] [employer]   Social Drivers of Health   Tobacco Use: Low Risk (10/12/2024)   Patient History    Smoking Tobacco Use: Never    Smokeless Tobacco Use: Never    Passive Exposure: Never  Financial Resource Strain: Low Risk (10/12/2024)   Overall Financial Resource Strain (CARDIA)    Difficulty of Paying Living Expenses: Not hard at all  Food Insecurity: No Food Insecurity (10/12/2024)   Epic    Worried About Radiation Protection Practitioner of Food in the Last Year: Never true    Ran Out of Food in the Last Year: Never true  Transportation Needs: No Transportation Needs (10/12/2024)   Epic    Lack of Transportation (Medical): No    Lack of Transportation (Non-Medical): No  Physical Activity: Insufficiently Active (10/12/2024)    Exercise Vital Sign    Days of Exercise per Week: 2 days    Minutes of Exercise per Session: 20 min  Stress: Stress Concern Present (10/12/2024)   [employer] of Occupational Health - Occupational Stress Questionnaire    Feeling of Stress: To some extent  Social Connections: Socially Integrated (10/12/2024)   Social Connection and Isolation Panel    Frequency of Communication with Friends and Family: More than three times a week    Frequency of Social Gatherings with Friends and Family: Twice a week    Attends Religious Services: More than 4 times per year    Active Member of Clubs or Organizations: Yes    Attends Banker Meetings: 1 to 4 times per year    Marital Status: Married  Catering Manager Violence: Not on file  Depression (PHQ2-9): Low Risk (10/12/2024)   Depression (PHQ2-9)    PHQ-2 Score: 4  Alcohol Screen: Low Risk (10/12/2024)   Alcohol Screen    Last Alcohol Screening Score (AUDIT): 1  Housing: Unknown (10/12/2024)   Epic    Unable to Pay for Housing in the Last Year: No    Number of Times Moved in the Last Year: Not on file    Homeless in the Last Year: No  Utilities: Not on file  Health Literacy: Not on file   Social History   Tobacco Use  Smoking Status Never   Passive exposure: Never  Smokeless Tobacco Never   Social History   Substance and Sexual Activity  Alcohol Use Yes   Comment: occas    Family History:  Family History  Problem Relation Age of Onset   Cancer Mother        cervical, precancerous   Cancer Father  Colon, Precancerous   Seizures Sister    Lung cancer Sister    Heart Problems Sister    Hashimoto's thyroiditis Brother    Learning disabilities Son    Cancer Maternal Grandmother        brain   Healthy Maternal Grandfather    Healthy Paternal Grandmother    Aneurysm Paternal Grandfather        Brain   Breast cancer Neg Hx     Past medical history, surgical history, medications, allergies, family  history and social history reviewed with patient today and changes made to appropriate areas of the chart.   ROS All other ROS negative except what is listed above and in the HPI.      Objective:    BP 99/64   Pulse 89   Temp 98.1 F (36.7 C) (Oral)   Ht 5' 3 (1.6 m)   Wt 158 lb 6.4 oz (71.8 kg)   LMP 09/22/2024 (Approximate)   SpO2 96%   BMI 28.06 kg/m   Wt Readings from Last 3 Encounters:  10/12/24 158 lb 6.4 oz (71.8 kg)  06/28/24 160 lb (72.6 kg)  03/07/24 149 lb (67.6 kg)    Physical Exam Vitals and nursing note reviewed. Exam conducted with a chaperone present.  Constitutional:      General: She is awake. She is not in acute distress.    Appearance: She is well-developed and well-groomed. She is not ill-appearing or toxic-appearing.  HENT:     Head: Normocephalic and atraumatic.     Right Ear: Hearing, tympanic membrane, ear canal and external ear normal. No drainage.     Left Ear: Hearing, tympanic membrane, ear canal and external ear normal. No drainage.     Nose: Nose normal.     Right Sinus: No maxillary sinus tenderness or frontal sinus tenderness.     Left Sinus: No maxillary sinus tenderness or frontal sinus tenderness.     Mouth/Throat:     Mouth: Mucous membranes are moist.     Pharynx: Oropharynx is clear. Uvula midline. No pharyngeal swelling, oropharyngeal exudate or posterior oropharyngeal erythema.  Eyes:     General: Lids are normal.        Right eye: No discharge.        Left eye: No discharge.     Extraocular Movements: Extraocular movements intact.     Conjunctiva/sclera: Conjunctivae normal.     Pupils: Pupils are equal, round, and reactive to light.     Visual Fields: Right eye visual fields normal and left eye visual fields normal.  Neck:     Thyroid : No thyromegaly.     Vascular: No carotid bruit.     Trachea: Trachea normal.  Cardiovascular:     Rate and Rhythm: Normal rate and regular rhythm.     Heart sounds: Normal heart sounds. No  murmur heard.    No gallop.  Pulmonary:     Effort: Pulmonary effort is normal. No accessory muscle usage or respiratory distress.     Breath sounds: Normal breath sounds. No decreased breath sounds, wheezing or rales.  Chest:  Breasts:    Right: Normal.     Left: Normal.  Abdominal:     General: Bowel sounds are normal.     Palpations: Abdomen is soft. There is no hepatomegaly or splenomegaly.     Tenderness: There is no abdominal tenderness.     Hernia: There is no hernia in the left inguinal area or right inguinal area.  Genitourinary:  Exam position: Lithotomy position.     Pubic Area: No rash.      Labia:        Right: No rash.        Left: No rash.      Urethra: No prolapse.     Vagina: Normal.     Cervix: Friability present. No discharge.     Uterus: Normal.      Adnexa: Right adnexa normal and left adnexa normal.     Comments: Cervix midline and slightly posterior. Pap obtained, scant blood noted after obtaining pap sample (made patient aware). Pap sent to lab. Musculoskeletal:        General: Normal range of motion.     Cervical back: Normal range of motion and neck supple.     Right lower leg: No edema.     Left lower leg: No edema.  Lymphadenopathy:     Head:     Right side of head: No submental, submandibular, tonsillar, preauricular or posterior auricular adenopathy.     Left side of head: No submental, submandibular, tonsillar, preauricular or posterior auricular adenopathy.     Cervical: No cervical adenopathy.     Upper Body:     Right upper body: No supraclavicular, axillary or pectoral adenopathy.     Left upper body: No supraclavicular, axillary or pectoral adenopathy.  Skin:    General: Skin is warm and dry.     Capillary Refill: Capillary refill takes less than 2 seconds.     Findings: No rash.  Neurological:     Mental Status: She is alert and oriented to person, place, and time.     Gait: Gait is intact.     Deep Tendon Reflexes: Reflexes are  normal and symmetric.     Reflex Scores:      Brachioradialis reflexes are 2+ on the right side and 2+ on the left side.      Patellar reflexes are 2+ on the right side and 2+ on the left side. Psychiatric:        Attention and Perception: Attention normal.        Mood and Affect: Mood normal.        Speech: Speech normal.        Behavior: Behavior normal. Behavior is cooperative.        Thought Content: Thought content normal.        Judgment: Judgment normal.    Results for orders placed or performed during the hospital encounter of 09/08/24  POC Covid19/Flu A&B Antigen   Collection Time: 09/08/24  2:56 PM  Result Value Ref Range   Influenza A Antigen, POC Negative Negative   Influenza B Antigen, POC Negative Negative   Covid Antigen, POC Negative Negative      Assessment & Plan:   Problem List Items Addressed This Visit       Cardiovascular and Mediastinum   Migraine with aura   Chronic, stable.  At this time continue current OTC medications at home and adjust as needed and triptan as needed.  Referral to neurology as needed in future if Botox is needed.      Relevant Medications   propranolol (INDERAL) 10 MG tablet   rizatriptan  (MAXALT -MLT) 5 MG disintegrating tablet     Other   Vitamin B 12 deficiency   Ongoing, recommend taking supplement.  Recheck level today.      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  Denies SI/HI today. Continue current medication regimen as ordered by psychiatry. Constipation with Wellbutrin  in past. Continue therapy sessions with [facility]. Currently PCP continues to fill Sertraline  and Xanax .      Relevant Orders   TSH   Long term prescription benzodiazepine use   Educated at length on risks of long term use -- refer to anxiety plan.  UDS due next 04/04/25 and controlled subs contract up to date July  2022.      Generalized anxiety disorder   Refer to depression plan of care for further.      Elevated low density lipoprotein (LDL) cholesterol level   Stable.  Noted on past labs, recheck today and continue diet focus.      Relevant Orders   Comprehensive metabolic panel with GFR   Lipid Panel w/o Chol/HDL Ratio   Other Visit Diagnoses       Cervical cancer screening       Pap performed and sent to lab.   Relevant Orders   Cytology - PAP     Encounter for annual physical exam       Annual physical today with labs and health maintenance reviewed, discussed with patient.        Follow up plan: Return in about 6 months (around 04/11/2025) for Depression, ANXIETY.   LABORATORY TESTING:  - Pap smear: Performed today  IMMUNIZATIONS:   - Tdap: Tetanus vaccination status reviewed: last tetanus booster within 10 years. - Influenza: Refused - discussed with patient - Pneumovax: Not applicable - Prevnar: Not applicable - HPV: Not applicable - Zostavax vaccine: Not applicable  SCREENING: -Mammogram: has been performed, does not need to repeat until age 71 - Colonoscopy: Not applicable  - Bone Density: Not applicable  -Hearing Test: Not applicable  -Spirometry: Not applicable   PATIENT COUNSELING:   Advised to take 1 mg of folate supplement per day if capable of pregnancy.   Sexuality: Discussed sexually transmitted diseases, partner selection, use of condoms, avoidance of unintended pregnancy  and contraceptive alternatives.   Advised to avoid cigarette smoking.  I discussed with the patient that most people either abstain from alcohol or drink within safe limits (<=14/week and <=4 drinks/occasion for males, <=7/weeks and <= 3 drinks/occasion for females) and that the risk for alcohol disorders and other health effects rises proportionally with the number of drinks per week and how often a drinker exceeds daily limits.  Discussed cessation/primary prevention of drug use  and availability of treatment for abuse.   Diet: Encouraged to adjust caloric intake to maintain  or achieve ideal body weight, to reduce intake of dietary saturated fat and total fat, to limit sodium intake by avoiding high sodium foods and not adding table salt, and to maintain adequate dietary potassium and calcium  preferably from fresh fruits, vegetables, and low-fat dairy products.    Stressed the importance of regular exercise  Injury prevention: Discussed safety belts, safety helmets, smoke detector, smoking near bedding or upholstery.   Dental health: Discussed importance of regular tooth brushing, flossing, and dental visits.    NEXT PREVENTATIVE PHYSICAL DUE IN 1 YEAR. Return in about 6 months (around 04/11/2025) for Depression, ANXIETY.           "

## 2024-10-12 NOTE — Assessment & Plan Note (Signed)
 Stable.  Noted on past labs, recheck today and continue diet focus.

## 2024-10-12 NOTE — Assessment & Plan Note (Signed)
 Chronic.  Denies SI/HI today. Continue current medication regimen as ordered by psychiatry. Constipation with Wellbutrin  in past. Continue therapy sessions with [facility]. Currently PCP continues to fill Sertraline  and Xanax .

## 2024-10-12 NOTE — Assessment & Plan Note (Signed)
Refer to depression plan of care for further. 

## 2024-10-12 NOTE — Assessment & Plan Note (Signed)
 Ongoing, recommend taking supplement.  Recheck level today.

## 2024-10-12 NOTE — Assessment & Plan Note (Signed)
 Educated at length on risks of long term use -- refer to anxiety plan.  UDS due next 04/04/25 and controlled subs contract up to date July 2022.

## 2024-10-12 NOTE — Assessment & Plan Note (Signed)
 Chronic, stable.  At this time continue current OTC medications at home and adjust as needed and triptan as needed.  Referral to neurology as needed in future if Botox is needed.

## 2024-10-13 ENCOUNTER — Ambulatory Visit: Payer: Self-pay | Admitting: Nurse Practitioner

## 2024-10-13 LAB — COMPREHENSIVE METABOLIC PANEL WITH GFR
ALT: 15 IU/L (ref 0–32)
AST: 17 IU/L (ref 0–40)
Albumin: 4.8 g/dL (ref 3.9–4.9)
Alkaline Phosphatase: 58 IU/L (ref 41–116)
BUN/Creatinine Ratio: 26 — ABNORMAL HIGH (ref 9–23)
BUN: 20 mg/dL (ref 6–20)
Bilirubin Total: 0.3 mg/dL (ref 0.0–1.2)
CO2: 24 mmol/L (ref 20–29)
Calcium: 9.7 mg/dL (ref 8.7–10.2)
Chloride: 100 mmol/L (ref 96–106)
Creatinine, Ser: 0.78 mg/dL (ref 0.57–1.00)
Globulin, Total: 2.4 g/dL (ref 1.5–4.5)
Glucose: 78 mg/dL (ref 70–99)
Potassium: 4.1 mmol/L (ref 3.5–5.2)
Sodium: 140 mmol/L (ref 134–144)
Total Protein: 7.2 g/dL (ref 6.0–8.5)
eGFR: 102 mL/min/1.73

## 2024-10-13 LAB — LIPID PANEL W/O CHOL/HDL RATIO
Cholesterol, Total: 202 mg/dL — ABNORMAL HIGH (ref 100–199)
HDL: 54 mg/dL
LDL Chol Calc (NIH): 128 mg/dL — ABNORMAL HIGH (ref 0–99)
Triglycerides: 113 mg/dL (ref 0–149)
VLDL Cholesterol Cal: 20 mg/dL (ref 5–40)

## 2024-10-13 LAB — CBC WITH DIFFERENTIAL/PLATELET
Basophils Absolute: 0.1 x10E3/uL (ref 0.0–0.2)
Basos: 1 %
EOS (ABSOLUTE): 0.2 x10E3/uL (ref 0.0–0.4)
Eos: 3 %
Hematocrit: 40.1 % (ref 34.0–46.6)
Hemoglobin: 13.2 g/dL (ref 11.1–15.9)
Immature Grans (Abs): 0 x10E3/uL (ref 0.0–0.1)
Immature Granulocytes: 0 %
Lymphocytes Absolute: 1.8 x10E3/uL (ref 0.7–3.1)
Lymphs: 31 %
MCH: 29.7 pg (ref 26.6–33.0)
MCHC: 32.9 g/dL (ref 31.5–35.7)
MCV: 90 fL (ref 79–97)
Monocytes Absolute: 0.5 x10E3/uL (ref 0.1–0.9)
Monocytes: 9 %
Neutrophils Absolute: 3.3 x10E3/uL (ref 1.4–7.0)
Neutrophils: 56 %
Platelets: 258 x10E3/uL (ref 150–450)
RBC: 4.44 x10E6/uL (ref 3.77–5.28)
RDW: 12.6 % (ref 11.7–15.4)
WBC: 5.9 x10E3/uL (ref 3.4–10.8)

## 2024-10-13 LAB — VITAMIN B12: Vitamin B-12: 425 pg/mL (ref 232–1245)

## 2024-10-13 LAB — TSH: TSH: 1.05 u[IU]/mL (ref 0.450–4.500)

## 2024-10-13 NOTE — Progress Notes (Signed)
 Contacted via MyChart  Good morning Stephanie Logan, your labs have returned and overall are stable with exception of mild elevation in lipid panel. Continue focus on health diet and regular exercise. Any questions? Keep being amazing!!  Thank you for allowing me to participate in your care.  I appreciate you. Kindest regards, Sharnetta Gielow

## 2024-10-17 LAB — CYTOLOGY - PAP
Comment: NEGATIVE
Diagnosis: NEGATIVE
High risk HPV: NEGATIVE

## 2024-10-17 NOTE — Progress Notes (Signed)
 Contacted via MyChart  Good morning Stephanie Logan, your pap has returned and is all negative. Great news!! Repeat in 5 years.

## 2025-04-12 ENCOUNTER — Ambulatory Visit: Admitting: Nurse Practitioner
